# Patient Record
Sex: Female | Born: 1945
Health system: Southern US, Community
[De-identification: ages and names within clinical notes are randomized; demographics above are authoritative.]

## PROBLEM LIST (undated history)

## (undated) DIAGNOSIS — D649 Anemia, unspecified: Secondary | ICD-10-CM

## (undated) DIAGNOSIS — I82452 Acute embolism and thrombosis of left peroneal vein: Secondary | ICD-10-CM

## (undated) DIAGNOSIS — I509 Heart failure, unspecified: Secondary | ICD-10-CM

## (undated) DIAGNOSIS — I10 Essential (primary) hypertension: Secondary | ICD-10-CM

## (undated) DIAGNOSIS — I272 Pulmonary hypertension, unspecified: Secondary | ICD-10-CM

## (undated) DIAGNOSIS — R7303 Prediabetes: Secondary | ICD-10-CM

## (undated) DIAGNOSIS — E785 Hyperlipidemia, unspecified: Secondary | ICD-10-CM

## (undated) DIAGNOSIS — J1282 Pneumonia due to coronavirus disease 2019: Secondary | ICD-10-CM

## (undated) DIAGNOSIS — K31819 Angiodysplasia of stomach and duodenum without bleeding: Secondary | ICD-10-CM

## (undated) DIAGNOSIS — M1990 Unspecified osteoarthritis, unspecified site: Secondary | ICD-10-CM

## (undated) DIAGNOSIS — J849 Interstitial pulmonary disease, unspecified: Secondary | ICD-10-CM

## (undated) DIAGNOSIS — U071 COVID-19: Secondary | ICD-10-CM

## (undated) DIAGNOSIS — K635 Polyp of colon: Secondary | ICD-10-CM

## (undated) HISTORY — DX: Hyperlipidemia, unspecified: E78.5

## (undated) HISTORY — DX: Anemia, unspecified: D64.9

## (undated) HISTORY — DX: Unspecified osteoarthritis, unspecified site: M19.90

## (undated) HISTORY — DX: Polyp of colon: K63.5

## (undated) HISTORY — DX: COVID-19: U07.1

## (undated) HISTORY — DX: Essential (primary) hypertension: I10

## (undated) HISTORY — DX: Angiodysplasia of stomach and duodenum without bleeding: K31.819

## (undated) HISTORY — DX: Pneumonia due to coronavirus disease 2019: J12.82

## (undated) HISTORY — DX: Acute embolism and thrombosis of left peroneal vein: I82.452

## (undated) HISTORY — PX: CHOLECYSTECTOMY: SHX55

---

## 2003-03-04 ENCOUNTER — Ambulatory Visit (HOSPITAL_COMMUNITY): Admission: RE | Admit: 2003-03-04 | Discharge: 2003-03-04 | Payer: Self-pay | Admitting: Internal Medicine

## 2004-04-19 ENCOUNTER — Ambulatory Visit: Payer: Self-pay | Admitting: Internal Medicine

## 2004-05-04 ENCOUNTER — Ambulatory Visit: Payer: Self-pay | Admitting: Internal Medicine

## 2004-05-04 ENCOUNTER — Ambulatory Visit (HOSPITAL_COMMUNITY): Admission: RE | Admit: 2004-05-04 | Discharge: 2004-05-04 | Payer: Self-pay | Admitting: Internal Medicine

## 2005-09-21 ENCOUNTER — Ambulatory Visit: Payer: Self-pay | Admitting: Cardiology

## 2009-11-17 ENCOUNTER — Ambulatory Visit: Payer: Self-pay | Admitting: Internal Medicine

## 2009-11-17 ENCOUNTER — Ambulatory Visit (HOSPITAL_COMMUNITY): Admission: RE | Admit: 2009-11-17 | Discharge: 2009-11-17 | Payer: Self-pay | Admitting: Internal Medicine

## 2010-03-28 ENCOUNTER — Ambulatory Visit (INDEPENDENT_AMBULATORY_CARE_PROVIDER_SITE_OTHER): Payer: BC Managed Care – PPO | Admitting: Internal Medicine

## 2010-03-28 DIAGNOSIS — R933 Abnormal findings on diagnostic imaging of other parts of digestive tract: Secondary | ICD-10-CM

## 2010-04-02 NOTE — H&P (Addendum)
Jacqueline, Harris                ACCOUNT NO.:  [id number]  MEDICAL RECORD NO.:  [id number]          PATIENT TYPE:  AMB  LOCATION:  DAY                           FACILITY:  [facility]  PHYSICIAN:  Hildred Laser, M.D.    DATE OF BIRTH:  [birth year]  DATE OF ADMISSION: DATE OF DISCHARGE:  LH                             HISTORY & PHYSICAL   PRESENTING COMPLAINT:  Thickened lower end of esophagus on CT.  HISTORY OF PRESENT ILLNESS:  Jacqueline Harris is a 65 year old Caucasian female who is referred through courtesy of Dr. Woody Seller for consideration of EGD. She recently experienced right posterolateral chest pain and went to emergency room which was on February 14, 2010.  She was noted to have mildly elevated D-dimer.  Therefore, she had chest CT.  It was negative for pulmonary embolism or other problems and it revealed mild thickening to distal esophagus raising possibility of esophagitis, etc.  Dr. Woody Seller, therefore, felt that she needs to have an EGD to make sure she does nothave any significant problems.  Bentleigh denies frequent heartburn.  She says she may have heartburn no more than once a week with certain foods. She has not experienced any more episodes of chest pain.  She has occasional dysphagia only if she does not chew her food good and food bolus always passes down after few seconds.  She denies sore throat, chronic cough or hoarseness.  She has a good appetite and her weight has been stable.  She did have EGD/ED back in April 2006, when she presented with 3-year history of intermittent solid food dysphagia and an abnormal barium study suggesting narrowing in the cervical esophagus.  She had noncritical Schatzki ring and a small sliding hiatal hernia and responded to esophageal dilation.  It was felt that she may have esophageal web.  She denies abdominal pain, melena or rectal bleeding. She is up-to-date on her colonoscopies as reviewed under past medical history.  CURRENT MEDICATIONS:   Vitamin D3 10,000 units b.i.d. for 5 days and thereafter daily.  PAST MEDICAL HISTORY:  She has multiple episodes of shingles, but without any sequelae.  She had cholecystectomy within the last couple of years.  Vitamin D deficiency was diagnosed recently.  She had a colonoscopy over 6 years ago and most recently in November last year when she had 2 small adenomas removed.  Her next exam would be due in November 2016.  ALLERGIES:  NK.  FAMILY HISTORY:  Brother has been treated for colon carcinoma diagnosed at age 63 and required surgery and chemoradiation and is doing well. She may have had a second-degree relative with colon carcinoma, but not certain.  Mother died of lung CA at age 80 and father of bladder carcinoma in his 47s.  SOCIAL HISTORY:  She is single.  She worked in [employer].  She used to drink alcohol socially, but quit over 20 years ago.  She smoked cigarettes on a pack a day for 13 years, but quit 23 years ago.  She has 2 children in good health.  OBJECTIVE:  VITAL SIGNS:  Weight 175 pounds, she is 64 inches tall,  pulse 72 per minute, blood pressure 128/74 and temp is 97.5. EYES:  Conjunctivae are pink.  Sclerae are nonicteric.Marland Kitchen MOUTH:  Oropharyngeal mucosa is normal.  Dentition in satisfactory condition. NECK:  No neck masses or thyromegaly noted. CARDIAC:  With regular rhythm.  Normal S1 and S2.  No murmur or gallop noted. LUNGS:  Clear to auscultation. ABDOMEN:  Symmetrical, soft and nontender without organomegaly or masses. EXTREMITIES:  No peripheral edema or clubbing noted.  Chest CT results as above.  ASSESSMENT:  Jacqueline Harris is a 65 year old Caucasian female who had chest CT angio for chest pain and found to have thickening to distal esophagus. She had her esophagus dilated about 6 years ago, but presently not having any symptoms.  It remains to be seen if she has a silent reflux or has esophagitis.  I am afraid the only way we can be sure as there  is nothing significant [place] to examine this area directly.  RECOMMENDATIONS:  Esophagogastroduodenoscopy in near future.  The patient is also advised to take her calcium daily.  We appreciate the opportunity to participate in the care of this nice lady.     Hildred Laser, M.D.     NR/MEDQ  D:  03/28/2010  T:  03/29/2010  Job:  [id number]  cc:   Dr. Woody Seller  Electronically Signed by Hildred Laser M.D. on 04/02/2010 02:13:13 PM

## 2010-04-04 ENCOUNTER — Ambulatory Visit (HOSPITAL_COMMUNITY)
Admission: RE | Admit: 2010-04-04 | Payer: BC Managed Care – PPO | Source: Ambulatory Visit | Admitting: Internal Medicine

## 2010-04-04 ENCOUNTER — Encounter (INDEPENDENT_AMBULATORY_CARE_PROVIDER_SITE_OTHER): Payer: BC Managed Care – PPO | Admitting: Internal Medicine

## 2010-05-19 ENCOUNTER — Ambulatory Visit (HOSPITAL_COMMUNITY)
Admission: RE | Admit: 2010-05-19 | Discharge: 2010-05-19 | Disposition: A | Discharge status: Home / Self Care | Payer: BC Managed Care – PPO | Source: Ambulatory Visit | Attending: Internal Medicine | Admitting: Internal Medicine

## 2010-05-19 ENCOUNTER — Other Ambulatory Visit (INDEPENDENT_AMBULATORY_CARE_PROVIDER_SITE_OTHER): Payer: Self-pay | Admitting: Internal Medicine

## 2010-05-19 ENCOUNTER — Encounter (HOSPITAL_BASED_OUTPATIENT_CLINIC_OR_DEPARTMENT_OTHER): Payer: BC Managed Care – PPO | Admitting: Internal Medicine

## 2010-05-19 DIAGNOSIS — K449 Diaphragmatic hernia without obstruction or gangrene: Secondary | ICD-10-CM

## 2010-05-19 DIAGNOSIS — K228 Other specified diseases of esophagus: Secondary | ICD-10-CM | POA: Insufficient documentation

## 2010-05-19 DIAGNOSIS — K2289 Other specified disease of esophagus: Secondary | ICD-10-CM | POA: Insufficient documentation

## 2010-05-19 DIAGNOSIS — R933 Abnormal findings on diagnostic imaging of other parts of digestive tract: Secondary | ICD-10-CM | POA: Insufficient documentation

## 2010-05-19 DIAGNOSIS — D130 Benign neoplasm of esophagus: Secondary | ICD-10-CM

## 2010-05-19 DIAGNOSIS — K31819 Angiodysplasia of stomach and duodenum without bleeding: Secondary | ICD-10-CM

## 2010-05-19 DIAGNOSIS — K2100 Gastro-esophageal reflux disease with esophagitis, without bleeding: Secondary | ICD-10-CM | POA: Insufficient documentation

## 2010-06-03 NOTE — Op Note (Signed)
Jacqueline Harris, Jacqueline Harris                          ACCOUNT NO.:  [id number]   MEDICAL RECORD NO.:  [id number]                   PATIENT TYPE:  AMB   LOCATION:  DAY                                  FACILITY:  [facility]   PHYSICIAN:  Hildred Laser, M.D.                 DATE OF BIRTH:  07-17-1945   DATE OF PROCEDURE:  03/04/2003  DATE OF DISCHARGE:                                 OPERATIVE REPORT   PROCEDURE:  Total colonoscopy.   ENDOSCOPIST:  Hildred Laser, M.D.   INDICATIONS:  Jacqueline Harris is a 65 year old Caucasian female who is undergoing  screening colonoscopy.  Family history is negative for CRC.   The procedure and risks were reviewed with the patient and informed consent  was obtained.   PREOPERATIVE MEDICATIONS:  Demerol 50 mg IV and Versed 6 mg IV in divided  dose.   FINDINGS:  Procedure performed in endoscopy suite.  The patient's vital  signs and O2 saturation were monitored during the procedure and remained  stable.  The patient was placed in the left lateral recumbent position and  rectal examination performed.  No abnormality noted on external or digital  exam.   Olympus videoscope was placed in the rectum and advanced under vision into  the sigmoid colon and beyond.  Preparation was satisfactory.  Scope was  passed to the cecum which was identified by appendiceal orifice and the  ileocecal valve.  Pictures were taken for the record.  As the scope was  withdrawn colonic mucosa was once carefully examined.  It was normal  throughout.  The rectal mucosa similarly was normal.   The scope was retroflexed to examine the anorectal junction.  There was a  single small anal papilla, otherwise normal exam.  The endoscope was  straightened and withdrawn.  The patient tolerated the procedure well.   FINAL DIAGNOSES:  Normal colonoscopy.   RECOMMENDATIONS:  1. He should continue yearly Hemoccults.  2. Next screening exam in 10 years from now.      ___________________________________________                                            Hildred Laser, M.D.   NR/MEDQ  D:  03/04/2003  T:  03/05/2003  Job:  [id number]   cc:   Jacqueline Harris  [address]  [place]  [place] [address]  Fax: [phone]

## 2010-06-03 NOTE — Op Note (Signed)
Jacqueline Harris, Jacqueline Harris                ACCOUNT NO.:  [id number]   MEDICAL RECORD NO.:  [id number]          PATIENT TYPE:  AMB   LOCATION:  DAY                           FACILITY:  [facility]   PHYSICIAN:  Hildred Laser, M.D.    DATE OF BIRTH:  Jun 24, 1945   DATE OF PROCEDURE:  05/04/2004  DATE OF DISCHARGE:                                 OPERATIVE REPORT   PROCEDURE:  Esophagogastroduodenoscopy with esophageal dilation.   INDICATIONS:  Antrice is a 65 year old Caucasian female with three-year  history of intermittent solid food dysphagia. She recently had barium study  which suggested narrowing to cervical esophagus and posterior impression  felt to be due to cricopharyngeus muscle. She also had small sliding hiatal  hernia with noncritical Schatzki's ring. Barium pill passed through  esophagus without any delay. She is undergoing diagnostic and therapeutic  procedure. Procedure risks were reviewed with the patient and informed  consent was obtained.   PREMEDICATION:  Cetacaine spray for pharyngeal topical anesthesia, Demerol  50 mg IV, Versed 10 mg IV in divided dose.   FINDINGS:  Procedure performed in endoscopy suite. The patient's vital signs  and O2 saturation were monitored during procedure and remained stable. The  patient was placed in left lateral position and Olympus videoscope was  passed oropharynx without any difficulty into esophagus.   Esophagus. Mucosa at the esophagus normal. Noncritical incomplete ring was  noted at GE junction which was at 37 cm and hiatus was 39. Proximal segment  of the esophagus was also examined and no web or stricture was noted.   Stomach. It was empty and distended very well insufflation. Folds of  proximal stomach were normal. Examination mucosa at body, antrum, pyloric  channel as well as angularis, fundus and cardia was normal.   Duodenum. Bulbar mucosa was normal. Scope was passed to the second part of  duodenum where mucosa and folds were  normal. Endoscope was withdrawn.   Esophagus was dilated initially by passing 56-French [facility] dilator;  however, I was not able to pass this dilator completely because of  resistance. Dilator was withdrawn. Endoscope was passed again, and there was  two small linear tears at cervical esophagus. Endoscope was withdrawn, and  54-French [facility] dilator was passed through esophagus to full insertion. As  the dilator was withdrawn, endoscope was passed again, and these linear  tears were noted to extended slightly. Pictures taken for the record.  Endoscope was withdrawn. The patient tolerated the procedure well.   FINAL DIAGNOSIS:  Incomplete noncritical Schatzki's ring and a small sliding  hiatal hernia. No obvious abnormality noted to cervical esophagus; however,  esophageal dilation with a 54- and 56-French dilator resulted in tear  possibly indicative of a web.   RECOMMENDATIONS:  She will resume her usual medications. The patient advised  to call the office in one week for a progress report.      NR/MEDQ  D:  05/04/2004  T:  05/04/2004  Job:  [id number]   cc:   Jerene Bears  [address]  [place]  [place] [address]  Fax: [phone]

## 2010-06-03 NOTE — Consult Note (Signed)
Harris, Jacqueline                ACCOUNT NO.:  [id number]   MEDICAL RECORD NO.:  [id number]         PATIENT TYPE:  AMB   LOCATION:                                 FACILITY:   PHYSICIAN:  Hildred Laser, M.D.    DATE OF BIRTH:  04-28-1945   DATE OF CONSULTATION:  DATE OF DISCHARGE:                                   CONSULTATION   REFERRING PHYSICIAN:  Jerene Bears, M.D.   REASON FOR CONSULTATION:  Dysphagia.   HISTORY OF PRESENT ILLNESS:  Jacqueline Harris is a 65 year old Caucasian female,  patient of Dr. Woody Seller.  She reports over the last two to three years she has  noticed intermittent solid food dysphagia which is very sporadic.  She also  notes new onset of heartburn and acid reflux as well as some regurgitation  of food, especially in the morning after sleeping all evening.  She notes  after eating, she states food gets trapped retrosternally.  She denies any  abdominal pain.  Her bowel movements are usually every two to three days.  Occasionally, she will have hard stools.  Denies any diarrhea.  She does  have a history of iron-deficiency anemia and is currently on iron  supplementation.  She has some occasional dark stool, but denies any melena.  Appetite is good, denies any early satiety.   She had a barium esophogram.  She had mild to moderate transverse narrowing  of cervical esophagus.  There is also posterior impression on the cervical  esophagus due to the cricopharyngeus muscle.  She has a small sliding hiatal  hernia and mild narrowing of the GE junction due to a Schatski's ring.   PAST MEDICAL HISTORY:  1.  Hypercholesterolemia.  2.  Anemia, currently on iron.  3.  Post-herpetic neuralgia.  4.  Colonoscopy March 05, 2003, by Dr. Laural Golden was normal.  5.  Seasonal allergies.   PAST SURGICAL HISTORY:  Denies.   CURRENT MEDICATIONS:  1.  Slow Iron one tablet daily.  2.  Aleve p.r.n.   ALLERGIES:  No known drug allergies.   FAMILY HISTORY:  No known family history of  colon carcinoma, liver, or  chronic GI problems.  Mother deceased at age 101, secondary to lung carcinoma  and she was a smoker.  Father deceased at age of 65, secondary to bladder  cancer.  She has five siblings, one with history significant for COPD and  diabetes mellitus.   SOCIAL HISTORY:  [name] is currently separated and lives alone.  She is  working two jobs currently.  She has two grown healthy children.  She  reports a 13 pack year history of tobacco use, quitting about 20 years ago.  She denies any alcohol or drug use.   REVIEW OF SYSTEMS:  CONSTITUTIONAL:  Weight is steadily increasing.  Denies  any fever or chills.  CARDIOVASCULAR:  Denies any chest pain, palpitations.  PULMONARY:  Denies any shortness of breath, dyspnea, cough, or hemoptysis.  GASTROINTESTINAL:  See HPI.  GYNECOLOGIC:  She is post-menopausal since age  12.   PHYSICAL EXAMINATION:  VITAL SIGNS:  Weight 171 pounds, height 64 inches,  temperature 98, blood pressure 126/70, pulse 68.  GENERAL:  Jacqueline Harris is a 65 year old Caucasian female who is alert, oriented,  pleasant, cooperative, in no acute distress.  HEENT:  Sclerae clear, nonicteric.  Oropharynx pink and moist without any  lesions.  NECK:  Supple without any mass or thyromegaly.  CHEST:  Regular rate and rhythm with normal S1 and S2, without any murmurs,  rubs, clicks, or gallops.  LUNGS:  Clear to auscultation bilaterally.  ABDOMEN:  Positive bowel sounds x4, no bruits auscultated.  Soft, nontender,  nondistended, without any mass or hepatosplenomegaly.  No rebound tenderness  or guarding.  EXTREMITIES:  2+ pedal pulses bilaterally, no edema.  SKIN:  Pink, warm, and dry without any rash or jaundice.   LABORATORY DATA:  From Dr. Woody Seller office from March 02, 2004:  Hemoglobin  11.2, hematocrit 33, MCV 73.9, platelets 309.  Hemoglobin A1C 5.9.  Glucose  107, BUN 10, creatinine 0.7, calcium 9.4, total protein 6.2, albumin 3.3,  alkaline phosphatase  68, SGOT 22, total bilirubin 0.9, SGPT 23.  Sodium 140,  potassium 4.3, chloride 107, CO2 of 28, and TSH 1.44.   IMPRESSION:  Jacqueline Harris is a 66 year old Caucasian female with a two  to three year history of intermittent solid food dysphagia.  Barium  esophogram shows Schatski's ring and small sliding hiatal hernia which is  most likely the source of her dysphagia.  She is going to need  esophagogastroduodenoscopy for further evaluation and esophageal dilatation  for her dysphagia.  She currently is not on proton pump inhibitor therapy,  but has been experiencing some frequent heartburn and acid reflux and  regurgitation recently, and may need to begin this.   RECOMMENDATIONS:  1.  We will schedule an esophagogastroduodenoscopy with esophageal      dilatation in the near future with Dr. Laural Golden.  I have discussed this      procedure, including risks, benefits, to include, but not limited to      bleeding, perforation or drug reaction.  She agrees with the plan and      consent will be obtained.  2.  We will consider initiating proton pump inhibitor after      esophagogastroduodenoscopy.  3.  Further recommendations pending procedure.   I would like to thank Dr. Woody Seller for allowing [place] to participate in the care of  [name].      KC/MEDQ  D:  04/19/2004  T:  04/19/2004  Job:  [id number]   cc:   Jerene Bears  [address]  [place]  [place] [address]  Fax: [phone]

## 2010-06-07 NOTE — Op Note (Signed)
NAMEGAYNELLE, Jacqueline Harris                ACCOUNT NO.:  [id number]  MEDICAL RECORD NO.:  [id number]           PATIENT TYPE:  O  LOCATION:  DAYP                          FACILITY:  [facility]  PHYSICIAN:  Hildred Laser, M.D.    DATE OF BIRTH:  Apr 21, 1945  DATE OF PROCEDURE:  05/19/2010 DATE OF DISCHARGE:                              OPERATIVE REPORT   PROCEDURE:  Esophagogastroduodenoscopy.  INDICATION:  [name] is a 65 year old Caucasian female who had right- sided chest pain and found to have elevated D-dimer and therefore had a chest CT back in January.  CT was negative for pulmonary emboli, but showed thickening to distal esophagus.  She has a history of GERD, but presently with sporadic heartburn.  She does not take any medications. She also denies dysphagia.  She is undergoing diagnostic EGD.  She had EGD over 6 years ago.  Procedure risks were reviewed with the patient. Informed consent was obtained.  MEDICATIONS FOR CONSCIOUS SEDATION:  Cetacaine spray for pharyngeal topical anesthesia, Demerol 50 mg IV, and Versed 5 mg IV.  FINDINGS:  Procedure performed in endoscopy suite.  The patient's vital signs and O2 saturations were monitored during the procedure and remained stable.  The patient was placed in left lateral recumbent position and Pentax videoscope was passed via oropharynx without any difficulty into esophagus.  Esophagus:  Mucosa of the proximal and middle third was normal. Distally, there was a 4-mm flat polyp which was proximal to GE junction which was ablated via cold biopsy on the way out.  There was focal edema and erythema to the GE junction, which was located at 34 cm.  No mass, ulcer, or other abnormalities were noted.  Hiatus was at 36.  Stomach:  It was empty and distended very well with insufflation.  Folds of proximal stomach were normal.  Examination of mucosa and body was normal.  In the antrum, she had 4 linear streaks of mucosa with telangiectasia.  These  extended almost down to the pylorus.  None had stigmata of bleed and they were left alone.  No erosions or changes of portal gastropathy were noted.  Angularis, fundus, and cardia were unremarkable.  Duodenum:  Bulbar mucosa was normal.  Scope was passed into second part of the duodenum where mucosa and folds were normal.  On the way out, the small distal esophageal polyp was ablated via cold biopsy and endoscope was withdrawn.  The patient tolerated the procedure well.  FINAL DIAGNOSES: 1. No evidence of esophageal neoplasm. 2. A 4-mm polyp ablated via cold biopsy from distal esophagus,     possibly a squamous papilloma. 3. Mild changes of reflux esophagitis, limited to gastroesophageal     junction. 4. A 2-cm sliding hiatal hernia. 5. Gastral antral vascular ectasia (GAVE) without stigmata of     bleeding.  RECOMMENDATIONS:  Antireflux measures.  Prescription written for omeprazole 20 mg, 30 with 5 refills.  I asked the patient to take it daily for 2 months or so and thereafter she could use it on demand.  I will be contacting the patient with results of biopsy and further recommendations if  any.          ______________________________ Hildred Laser, M.D.     NR/MEDQ  D:  05/19/2010  T:  05/20/2010  Job:  [id number]  cc:   Dr. Woody Seller  Electronically Signed by Hildred Laser M.D. on 06/07/2010 12:23:16 PM

## 2013-06-03 ENCOUNTER — Telehealth: Payer: Self-pay

## 2013-06-03 NOTE — Telephone Encounter (Signed)
Noted.

## 2013-06-03 NOTE — Telephone Encounter (Signed)
Pt received a letter for TCS be she is going to stay with NUR. I gave her the number to call his office.

## 2014-02-09 ENCOUNTER — Telehealth (INDEPENDENT_AMBULATORY_CARE_PROVIDER_SITE_OTHER): Payer: Self-pay | Admitting: *Deleted

## 2014-02-09 NOTE — Telephone Encounter (Signed)
Calling to schedule TCS

## 2014-02-11 NOTE — Telephone Encounter (Signed)
Spoke to Ms Vadala, she isn't due until Nov 2016

## 2014-09-30 ENCOUNTER — Other Ambulatory Visit (INDEPENDENT_AMBULATORY_CARE_PROVIDER_SITE_OTHER): Payer: Self-pay | Admitting: *Deleted

## 2014-09-30 DIAGNOSIS — Z86010 Personal history of colonic polyps: Secondary | ICD-10-CM

## 2014-09-30 DIAGNOSIS — Z800 Family history of malignant neoplasm of digestive organs: Secondary | ICD-10-CM

## 2014-10-27 ENCOUNTER — Telehealth (INDEPENDENT_AMBULATORY_CARE_PROVIDER_SITE_OTHER): Payer: Self-pay | Admitting: *Deleted

## 2014-10-27 DIAGNOSIS — Z1211 Encounter for screening for malignant neoplasm of colon: Secondary | ICD-10-CM

## 2014-10-27 MED ORDER — SUPREP BOWEL PREP KIT 17.5-3.13-1.6 GM/177ML PO SOLN: 1.0000 | Freq: Once | ORAL | Status: DC

## 2014-10-27 NOTE — Telephone Encounter (Signed)
Patient needs suprep

## 2014-11-10 ENCOUNTER — Telehealth (INDEPENDENT_AMBULATORY_CARE_PROVIDER_SITE_OTHER): Payer: Self-pay | Admitting: *Deleted

## 2014-11-10 NOTE — Telephone Encounter (Signed)
Referring MD/PCP: vyas   Procedure: tcs  Reason/Indication:  Hx polyps, fam hx colon ca  Has patient had this procedure before?  Yes, 2011 -- epic  If so, when, by whom and where?    Is there a family history of colon cancer?  Yes, brother  Who?  What age when diagnosed?    Is patient diabetic?   no      Does patient have prosthetic heart valve?  no  Do you have a pacemaker?  no  Has patient ever had endocarditis? no  Has patient had joint replacement within last 12 months?  no  Does patient tend to be constipated or take laxatives? no  Does patient have a history of alcohol/drug use? no  Is patient on Coumadin, Plavix and/or Aspirin? no  Medications: none  Allergies: nkda  Medication Adjustment:   Procedure date & time: 12/03/14 at 830

## 2014-11-10 NOTE — Telephone Encounter (Signed)
agree

## 2014-12-03 ENCOUNTER — Encounter (HOSPITAL_COMMUNITY): Payer: Self-pay | Admitting: *Deleted

## 2014-12-03 ENCOUNTER — Ambulatory Visit (HOSPITAL_COMMUNITY)
Admission: RE | Admit: 2014-12-03 | Discharge: 2014-12-03 | Disposition: A | Discharge status: Home / Self Care | Payer: Medicare HMO | Source: Ambulatory Visit | Attending: Internal Medicine | Admitting: Internal Medicine

## 2014-12-03 ENCOUNTER — Other Ambulatory Visit (INDEPENDENT_AMBULATORY_CARE_PROVIDER_SITE_OTHER): Payer: Self-pay | Admitting: *Deleted

## 2014-12-03 ENCOUNTER — Encounter (INDEPENDENT_AMBULATORY_CARE_PROVIDER_SITE_OTHER): Payer: Self-pay

## 2014-12-03 ENCOUNTER — Encounter (HOSPITAL_COMMUNITY)
Admission: RE | Disposition: A | Discharge status: Home / Self Care | Payer: Self-pay | Source: Ambulatory Visit | Attending: Internal Medicine

## 2014-12-03 DIAGNOSIS — Z800 Family history of malignant neoplasm of digestive organs: Secondary | ICD-10-CM

## 2014-12-03 DIAGNOSIS — Z86010 Personal history of colonic polyps: Secondary | ICD-10-CM | POA: Insufficient documentation

## 2014-12-03 DIAGNOSIS — Z801 Family history of malignant neoplasm of trachea, bronchus and lung: Secondary | ICD-10-CM | POA: Diagnosis not present

## 2014-12-03 DIAGNOSIS — R161 Splenomegaly, not elsewhere classified: Secondary | ICD-10-CM

## 2014-12-03 DIAGNOSIS — Z1211 Encounter for screening for malignant neoplasm of colon: Secondary | ICD-10-CM | POA: Insufficient documentation

## 2014-12-03 DIAGNOSIS — K644 Residual hemorrhoidal skin tags: Secondary | ICD-10-CM | POA: Diagnosis not present

## 2014-12-03 DIAGNOSIS — Z87891 Personal history of nicotine dependence: Secondary | ICD-10-CM | POA: Diagnosis not present

## 2014-12-03 DIAGNOSIS — K648 Other hemorrhoids: Secondary | ICD-10-CM

## 2014-12-03 DIAGNOSIS — K6389 Other specified diseases of intestine: Secondary | ICD-10-CM | POA: Diagnosis not present

## 2014-12-03 DIAGNOSIS — Q2733 Arteriovenous malformation of digestive system vessel: Secondary | ICD-10-CM | POA: Diagnosis not present

## 2014-12-03 HISTORY — PX: COLONOSCOPY: SHX5424

## 2014-12-03 SURGERY — COLONOSCOPY
Anesthesia: Moderate Sedation

## 2014-12-03 MED ORDER — SODIUM CHLORIDE 0.9 % IV SOLN
INTRAVENOUS | Status: DC
  Administered 2014-12-03: 10:00:00 via INTRAVENOUS

## 2014-12-03 MED ORDER — MIDAZOLAM HCL 5 MG/5ML IJ SOLN
INTRAMUSCULAR | Status: DC | PRN
  Administered 2014-12-03 (×3): 2 mg via INTRAVENOUS

## 2014-12-03 MED ORDER — MEPERIDINE HCL 50 MG/ML IJ SOLN
INTRAMUSCULAR | Status: DC | PRN
  Administered 2014-12-03 (×2): 25 mg via INTRAVENOUS

## 2014-12-03 MED ORDER — MEPERIDINE HCL 50 MG/ML IJ SOLN
INTRAMUSCULAR | Status: AC
  Filled 2014-12-03: qty 1

## 2014-12-03 MED ORDER — MIDAZOLAM HCL 5 MG/5ML IJ SOLN
INTRAMUSCULAR | Status: AC
  Filled 2014-12-03: qty 10

## 2014-12-03 NOTE — H&P (Signed)
Jacqueline Harris is an 69 y.o. female.   Chief Complaint:  Patient is here for colonoscopy. HPI:  69 year old Caucasian female was history of colonic adenomas family history of CRC in brother. She is here for surveillance colonoscopy. Last exam was in November 2011 with removal of two small tubular adenomas. She denies abdominal pain change in bowel habits or rectal bleeding.  family history significant for CRC and brother who is 64, diagnosis and 2 years later of metastatic disease.  History reviewed. No pertinent past medical history.  Past Surgical History  Procedure Laterality Date  . Cholecystectomy      Family History  Problem Relation Age of Onset  . Cancer - Lung Mother   . Cancer Father   . Cancer - Colon Brother   . Diabetes Sister   . Hypertension Sister    Social History:  reports that she has quit smoking. She does not have any smokeless tobacco history on file. She reports that she does not drink alcohol or use illicit drugs.  Allergies: No Known Allergies  Medications Prior to Admission  Medication Sig Dispense Refill  . SUPREP BOWEL PREP SOLN Take 1 kit by mouth once. 1 Bottle 0    No results found for this or any previous visit (from the past 48 hour(s)). No results found.  ROS  Blood pressure 115/79, pulse 85, temperature 98.2 F (36.8 C), temperature source Oral, resp. rate 18, height 5' 4" (1.626 m), weight 155 lb (70.308 kg), SpO2 100 %. Physical Exam  Constitutional: She appears well-developed and well-nourished.  HENT:  Mouth/Throat: Oropharynx is clear and moist.  Eyes: Conjunctivae are normal. No scleral icterus.  Neck: No thyromegaly present.  Cardiovascular: Normal rate, regular rhythm and normal heart sounds.   No murmur heard. Respiratory: Effort normal and breath sounds normal.  GI: Soft. She exhibits no distension and no mass. There is no tenderness.  Spleen tip is palpable.  Musculoskeletal: She exhibits no edema.  Lymphadenopathy:   She has no cervical adenopathy.  Neurological: She is alert.  Skin: Skin is warm and dry.     Assessment/Plan History of colonic adenomas and family history of CRC. Surveillance colonoscopy.  REHMAN,NAJEEB U 12/03/2014, 11:06 AM

## 2014-12-03 NOTE — Discharge Instructions (Signed)
Resume usual diet. No driving for 24 hours. Next colonoscopy in 5 years. Ultrasound to be scheduled.  Colonoscopy, Care After These instructions give you information on caring for yourself after your procedure. Your doctor may also give you more specific instructions. Call your doctor if you have any problems or questions after your procedure. HOME CARE  Do not drive for 24 hours.  Do not sign important papers or use machinery for 24 hours.  You may shower.  You may go back to your usual activities, but go slower for the first 24 hours.  Take rest breaks often during the first 24 hours.  Walk around or use warm packs on your belly (abdomen) if you have belly cramping or gas.  Drink enough fluids to keep your pee (urine) clear or pale yellow.  Resume your normal diet. Avoid heavy or fried foods.  Avoid drinking alcohol for 24 hours or as told by your doctor.  Only take medicines as told by your doctor. If a tissue sample (biopsy) was taken during the procedure:   Do not take aspirin or blood thinners for 7 days, or as told by your doctor.  Do not drink alcohol for 7 days, or as told by your doctor.  Eat soft foods for the first 24 hours. GET HELP IF: You still have a small amount of blood in your poop (stool) 2-3 days after the procedure. GET HELP RIGHT AWAY IF:  You have more than a small amount of blood in your poop.  You see clumps of tissue (blood clots) in your poop.  Your belly is puffy (swollen).  You feel sick to your stomach (nauseous) or throw up (vomit).  You have a fever.  You have belly pain that gets worse and medicine does not help. MAKE SURE YOU:  Understand these instructions.  Will watch your condition.  Will get help right away if you are not doing well or get worse.   This information is not intended to replace advice given to you by your health care provider. Make sure you discuss any questions you have with your health care provider.     Document Released: 02/04/2010 Document Revised: 01/07/2013 Document Reviewed: 09/09/2012 Elsevier Interactive Patient Education [employer].

## 2014-12-03 NOTE — Op Note (Signed)
COLONOSCOPY PROCEDURE REPORT  PATIENT:  Jacqueline Harris  MR#:  [id number] Birthdate:  01-05-1946, 69 y.o., female Endoscopist:  Dr. Rogene Houston, MD Referred By:  Dr. Glenda Chroman, MD Procedure Date: 12/03/2014  Procedure:   Colonoscopy  Indications:  Patient is 69 year old Caucasian female with history of colonic adenomas and family history of CRC in brother who was diagnosed at age 80 and oto-metastatic disease at age 22. Last colonoscopy was 5 years ago with removal of 2 small tubular adenomas.  Informed Consent:  The procedure and risks were reviewed with the patient and informed consent was obtained.  Medications:  Demerol 50 mg IV Versed 6 mg IV  Description of procedure:  After a digital rectal exam was performed, that colonoscope was advanced from the anus through the rectum and colon to the area of the cecum, ileocecal valve and appendiceal orifice. The cecum was deeply intubated. These structures were well-seen and photographed for the record. From the level of the cecum and ileocecal valve, the scope was slowly and cautiously withdrawn. The mucosal surfaces were carefully surveyed utilizing scope tip to flexion to facilitate fold flattening as needed. The scope was pulled down into the rectum where a thorough exam including retroflexion was performed.  Findings:  Prep excellent. Normal mucosa of cecum, ascending colon, hepatic flexure, transverse colon, splenic flexure, descending and sigmoid colon. Number rectal mucosa. Small hemorrhoids below the dentate line and anal papillae.   Therapeutic/Diagnostic Maneuvers Performed:  None  Complications:  None  EBL: None  Cecal Withdrawal Time:  7 minutes  Impression:  Normal colonoscopy except small external hemorrhoids and anal papillae.  Recommendations:  Standard instructions given. Upper abdominal ultrasound to be scheduled further evaluate palpable spleen. Next colonoscopy in 5 years.   Hyman Crossan U   12/03/2014 11:40 AM  CC: Dr. Glenda Chroman., MD & Dr. Rayne Du ref. provider found

## 2014-12-07 ENCOUNTER — Encounter (HOSPITAL_COMMUNITY): Payer: Self-pay | Admitting: Internal Medicine

## 2014-12-11 ENCOUNTER — Ambulatory Visit (HOSPITAL_COMMUNITY): Payer: Medicare HMO

## 2014-12-11 ENCOUNTER — Ambulatory Visit (HOSPITAL_COMMUNITY)
Admission: RE | Admit: 2014-12-11 | Discharge: 2014-12-11 | Disposition: A | Discharge status: Home / Self Care | Payer: Medicare HMO | Source: Ambulatory Visit | Attending: Internal Medicine | Admitting: Internal Medicine

## 2014-12-11 DIAGNOSIS — Z9049 Acquired absence of other specified parts of digestive tract: Secondary | ICD-10-CM | POA: Diagnosis not present

## 2014-12-11 DIAGNOSIS — R161 Splenomegaly, not elsewhere classified: Secondary | ICD-10-CM | POA: Diagnosis present

## 2014-12-28 ENCOUNTER — Encounter (INDEPENDENT_AMBULATORY_CARE_PROVIDER_SITE_OTHER): Payer: Self-pay

## 2016-01-31 DIAGNOSIS — Z713 Dietary counseling and surveillance: Secondary | ICD-10-CM | POA: Diagnosis not present

## 2016-01-31 DIAGNOSIS — Z789 Other specified health status: Secondary | ICD-10-CM | POA: Diagnosis not present

## 2016-01-31 DIAGNOSIS — Z6827 Body mass index (BMI) 27.0-27.9, adult: Secondary | ICD-10-CM | POA: Diagnosis not present

## 2016-01-31 DIAGNOSIS — E1165 Type 2 diabetes mellitus with hyperglycemia: Secondary | ICD-10-CM | POA: Diagnosis not present

## 2016-01-31 DIAGNOSIS — Z299 Encounter for prophylactic measures, unspecified: Secondary | ICD-10-CM | POA: Diagnosis not present

## 2016-01-31 DIAGNOSIS — M353 Polymyalgia rheumatica: Secondary | ICD-10-CM | POA: Diagnosis not present

## 2016-02-18 ENCOUNTER — Encounter: Payer: Self-pay | Admitting: Internal Medicine

## 2016-04-21 DIAGNOSIS — Z87891 Personal history of nicotine dependence: Secondary | ICD-10-CM | POA: Diagnosis not present

## 2016-04-21 DIAGNOSIS — Z6828 Body mass index (BMI) 28.0-28.9, adult: Secondary | ICD-10-CM | POA: Diagnosis not present

## 2016-04-21 DIAGNOSIS — Z0000 Encounter for general adult medical examination without abnormal findings: Secondary | ICD-10-CM | POA: Diagnosis not present

## 2016-05-08 DIAGNOSIS — M353 Polymyalgia rheumatica: Secondary | ICD-10-CM | POA: Diagnosis not present

## 2016-05-08 DIAGNOSIS — Z299 Encounter for prophylactic measures, unspecified: Secondary | ICD-10-CM | POA: Diagnosis not present

## 2016-05-08 DIAGNOSIS — Z713 Dietary counseling and surveillance: Secondary | ICD-10-CM | POA: Diagnosis not present

## 2016-05-08 DIAGNOSIS — E1165 Type 2 diabetes mellitus with hyperglycemia: Secondary | ICD-10-CM | POA: Diagnosis not present

## 2016-05-08 DIAGNOSIS — Z1231 Encounter for screening mammogram for malignant neoplasm of breast: Secondary | ICD-10-CM | POA: Diagnosis not present

## 2016-05-17 DIAGNOSIS — R69 Illness, unspecified: Secondary | ICD-10-CM | POA: Diagnosis not present

## 2016-06-07 DIAGNOSIS — R69 Illness, unspecified: Secondary | ICD-10-CM | POA: Diagnosis not present

## 2016-08-11 DIAGNOSIS — E1165 Type 2 diabetes mellitus with hyperglycemia: Secondary | ICD-10-CM | POA: Diagnosis not present

## 2016-08-11 DIAGNOSIS — M353 Polymyalgia rheumatica: Secondary | ICD-10-CM | POA: Diagnosis not present

## 2016-08-11 DIAGNOSIS — Z6827 Body mass index (BMI) 27.0-27.9, adult: Secondary | ICD-10-CM | POA: Diagnosis not present

## 2016-08-11 DIAGNOSIS — E7800 Pure hypercholesterolemia, unspecified: Secondary | ICD-10-CM | POA: Diagnosis not present

## 2016-08-11 DIAGNOSIS — Z299 Encounter for prophylactic measures, unspecified: Secondary | ICD-10-CM | POA: Diagnosis not present

## 2016-09-19 DIAGNOSIS — H524 Presbyopia: Secondary | ICD-10-CM | POA: Diagnosis not present

## 2016-11-01 DIAGNOSIS — Z79899 Other long term (current) drug therapy: Secondary | ICD-10-CM | POA: Diagnosis not present

## 2016-11-01 DIAGNOSIS — Z1339 Encounter for screening examination for other mental health and behavioral disorders: Secondary | ICD-10-CM | POA: Diagnosis not present

## 2016-11-01 DIAGNOSIS — Z6826 Body mass index (BMI) 26.0-26.9, adult: Secondary | ICD-10-CM | POA: Diagnosis not present

## 2016-11-01 DIAGNOSIS — Z299 Encounter for prophylactic measures, unspecified: Secondary | ICD-10-CM | POA: Diagnosis not present

## 2016-11-01 DIAGNOSIS — R5383 Other fatigue: Secondary | ICD-10-CM | POA: Diagnosis not present

## 2016-11-01 DIAGNOSIS — Z1211 Encounter for screening for malignant neoplasm of colon: Secondary | ICD-10-CM | POA: Diagnosis not present

## 2016-11-01 DIAGNOSIS — Z7189 Other specified counseling: Secondary | ICD-10-CM | POA: Diagnosis not present

## 2016-11-01 DIAGNOSIS — Z0000 Encounter for general adult medical examination without abnormal findings: Secondary | ICD-10-CM | POA: Diagnosis not present

## 2016-11-01 DIAGNOSIS — E7800 Pure hypercholesterolemia, unspecified: Secondary | ICD-10-CM | POA: Diagnosis not present

## 2016-11-01 DIAGNOSIS — Z1331 Encounter for screening for depression: Secondary | ICD-10-CM | POA: Diagnosis not present

## 2016-11-07 DIAGNOSIS — R69 Illness, unspecified: Secondary | ICD-10-CM | POA: Diagnosis not present

## 2016-12-18 DIAGNOSIS — R69 Illness, unspecified: Secondary | ICD-10-CM | POA: Diagnosis not present

## 2016-12-22 DIAGNOSIS — Z6826 Body mass index (BMI) 26.0-26.9, adult: Secondary | ICD-10-CM | POA: Diagnosis not present

## 2016-12-22 DIAGNOSIS — Z713 Dietary counseling and surveillance: Secondary | ICD-10-CM | POA: Diagnosis not present

## 2016-12-22 DIAGNOSIS — Z299 Encounter for prophylactic measures, unspecified: Secondary | ICD-10-CM | POA: Diagnosis not present

## 2016-12-22 DIAGNOSIS — M353 Polymyalgia rheumatica: Secondary | ICD-10-CM | POA: Diagnosis not present

## 2016-12-22 DIAGNOSIS — E1165 Type 2 diabetes mellitus with hyperglycemia: Secondary | ICD-10-CM | POA: Diagnosis not present

## 2017-01-18 DIAGNOSIS — E28319 Asymptomatic premature menopause: Secondary | ICD-10-CM | POA: Diagnosis not present

## 2017-01-18 DIAGNOSIS — E2839 Other primary ovarian failure: Secondary | ICD-10-CM | POA: Diagnosis not present

## 2017-02-12 DIAGNOSIS — R69 Illness, unspecified: Secondary | ICD-10-CM | POA: Diagnosis not present

## 2017-03-14 DIAGNOSIS — Z713 Dietary counseling and surveillance: Secondary | ICD-10-CM | POA: Diagnosis not present

## 2017-03-14 DIAGNOSIS — R350 Frequency of micturition: Secondary | ICD-10-CM | POA: Diagnosis not present

## 2017-03-14 DIAGNOSIS — N390 Urinary tract infection, site not specified: Secondary | ICD-10-CM | POA: Diagnosis not present

## 2017-03-14 DIAGNOSIS — Z6826 Body mass index (BMI) 26.0-26.9, adult: Secondary | ICD-10-CM | POA: Diagnosis not present

## 2017-03-14 DIAGNOSIS — Z299 Encounter for prophylactic measures, unspecified: Secondary | ICD-10-CM | POA: Diagnosis not present

## 2017-04-19 DIAGNOSIS — Z6826 Body mass index (BMI) 26.0-26.9, adult: Secondary | ICD-10-CM | POA: Diagnosis not present

## 2017-04-19 DIAGNOSIS — Z299 Encounter for prophylactic measures, unspecified: Secondary | ICD-10-CM | POA: Diagnosis not present

## 2017-04-19 DIAGNOSIS — M545 Low back pain: Secondary | ICD-10-CM | POA: Diagnosis not present

## 2017-04-19 DIAGNOSIS — Z713 Dietary counseling and surveillance: Secondary | ICD-10-CM | POA: Diagnosis not present

## 2017-04-19 DIAGNOSIS — N390 Urinary tract infection, site not specified: Secondary | ICD-10-CM | POA: Diagnosis not present

## 2017-04-27 DIAGNOSIS — M353 Polymyalgia rheumatica: Secondary | ICD-10-CM | POA: Diagnosis not present

## 2017-04-27 DIAGNOSIS — E1165 Type 2 diabetes mellitus with hyperglycemia: Secondary | ICD-10-CM | POA: Diagnosis not present

## 2017-04-27 DIAGNOSIS — Z299 Encounter for prophylactic measures, unspecified: Secondary | ICD-10-CM | POA: Diagnosis not present

## 2017-04-27 DIAGNOSIS — Z713 Dietary counseling and surveillance: Secondary | ICD-10-CM | POA: Diagnosis not present

## 2017-04-27 DIAGNOSIS — Z6826 Body mass index (BMI) 26.0-26.9, adult: Secondary | ICD-10-CM | POA: Diagnosis not present

## 2017-05-10 DIAGNOSIS — Z1231 Encounter for screening mammogram for malignant neoplasm of breast: Secondary | ICD-10-CM | POA: Diagnosis not present

## 2017-05-21 DIAGNOSIS — N950 Postmenopausal bleeding: Secondary | ICD-10-CM | POA: Diagnosis not present

## 2017-05-31 DIAGNOSIS — N950 Postmenopausal bleeding: Secondary | ICD-10-CM | POA: Diagnosis not present

## 2017-05-31 DIAGNOSIS — I868 Varicose veins of other specified sites: Secondary | ICD-10-CM | POA: Diagnosis not present

## 2017-06-20 DIAGNOSIS — R69 Illness, unspecified: Secondary | ICD-10-CM | POA: Diagnosis not present

## 2017-07-17 DIAGNOSIS — R69 Illness, unspecified: Secondary | ICD-10-CM | POA: Diagnosis not present

## 2017-08-07 DIAGNOSIS — M353 Polymyalgia rheumatica: Secondary | ICD-10-CM | POA: Diagnosis not present

## 2017-08-07 DIAGNOSIS — Z6826 Body mass index (BMI) 26.0-26.9, adult: Secondary | ICD-10-CM | POA: Diagnosis not present

## 2017-08-07 DIAGNOSIS — R21 Rash and other nonspecific skin eruption: Secondary | ICD-10-CM | POA: Diagnosis not present

## 2017-08-07 DIAGNOSIS — Z299 Encounter for prophylactic measures, unspecified: Secondary | ICD-10-CM | POA: Diagnosis not present

## 2017-08-07 DIAGNOSIS — E1165 Type 2 diabetes mellitus with hyperglycemia: Secondary | ICD-10-CM | POA: Diagnosis not present

## 2017-10-10 DIAGNOSIS — H25013 Cortical age-related cataract, bilateral: Secondary | ICD-10-CM | POA: Diagnosis not present

## 2017-10-10 DIAGNOSIS — H524 Presbyopia: Secondary | ICD-10-CM | POA: Diagnosis not present

## 2017-10-10 DIAGNOSIS — H04123 Dry eye syndrome of bilateral lacrimal glands: Secondary | ICD-10-CM | POA: Diagnosis not present

## 2017-10-10 DIAGNOSIS — H2513 Age-related nuclear cataract, bilateral: Secondary | ICD-10-CM | POA: Diagnosis not present

## 2017-11-20 DIAGNOSIS — E7800 Pure hypercholesterolemia, unspecified: Secondary | ICD-10-CM | POA: Diagnosis not present

## 2017-11-20 DIAGNOSIS — Z299 Encounter for prophylactic measures, unspecified: Secondary | ICD-10-CM | POA: Diagnosis not present

## 2017-11-20 DIAGNOSIS — Z6826 Body mass index (BMI) 26.0-26.9, adult: Secondary | ICD-10-CM | POA: Diagnosis not present

## 2017-11-20 DIAGNOSIS — Z789 Other specified health status: Secondary | ICD-10-CM | POA: Diagnosis not present

## 2017-11-20 DIAGNOSIS — Z7189 Other specified counseling: Secondary | ICD-10-CM | POA: Diagnosis not present

## 2017-11-20 DIAGNOSIS — Z79899 Other long term (current) drug therapy: Secondary | ICD-10-CM | POA: Diagnosis not present

## 2017-11-20 DIAGNOSIS — R5383 Other fatigue: Secondary | ICD-10-CM | POA: Diagnosis not present

## 2017-11-20 DIAGNOSIS — Z1211 Encounter for screening for malignant neoplasm of colon: Secondary | ICD-10-CM | POA: Diagnosis not present

## 2017-11-20 DIAGNOSIS — Z1331 Encounter for screening for depression: Secondary | ICD-10-CM | POA: Diagnosis not present

## 2017-11-20 DIAGNOSIS — Z0000 Encounter for general adult medical examination without abnormal findings: Secondary | ICD-10-CM | POA: Diagnosis not present

## 2017-11-20 DIAGNOSIS — E1165 Type 2 diabetes mellitus with hyperglycemia: Secondary | ICD-10-CM | POA: Diagnosis not present

## 2017-11-27 DIAGNOSIS — R69 Illness, unspecified: Secondary | ICD-10-CM | POA: Diagnosis not present

## 2018-01-01 DIAGNOSIS — R69 Illness, unspecified: Secondary | ICD-10-CM | POA: Diagnosis not present

## 2018-01-03 DIAGNOSIS — R69 Illness, unspecified: Secondary | ICD-10-CM | POA: Diagnosis not present

## 2018-02-25 DIAGNOSIS — M1710 Unilateral primary osteoarthritis, unspecified knee: Secondary | ICD-10-CM | POA: Diagnosis not present

## 2018-02-25 DIAGNOSIS — E1165 Type 2 diabetes mellitus with hyperglycemia: Secondary | ICD-10-CM | POA: Diagnosis not present

## 2018-02-25 DIAGNOSIS — Z6825 Body mass index (BMI) 25.0-25.9, adult: Secondary | ICD-10-CM | POA: Diagnosis not present

## 2018-02-25 DIAGNOSIS — Z299 Encounter for prophylactic measures, unspecified: Secondary | ICD-10-CM | POA: Diagnosis not present

## 2018-02-25 DIAGNOSIS — M353 Polymyalgia rheumatica: Secondary | ICD-10-CM | POA: Diagnosis not present

## 2018-03-06 DIAGNOSIS — M869 Osteomyelitis, unspecified: Secondary | ICD-10-CM | POA: Diagnosis not present

## 2018-03-06 DIAGNOSIS — L03115 Cellulitis of right lower limb: Secondary | ICD-10-CM | POA: Diagnosis not present

## 2018-03-06 DIAGNOSIS — E11621 Type 2 diabetes mellitus with foot ulcer: Secondary | ICD-10-CM | POA: Diagnosis not present

## 2018-03-06 DIAGNOSIS — E11628 Type 2 diabetes mellitus with other skin complications: Secondary | ICD-10-CM | POA: Diagnosis not present

## 2018-03-07 DIAGNOSIS — E11621 Type 2 diabetes mellitus with foot ulcer: Secondary | ICD-10-CM | POA: Diagnosis not present

## 2018-03-07 DIAGNOSIS — L03115 Cellulitis of right lower limb: Secondary | ICD-10-CM | POA: Diagnosis not present

## 2018-03-07 DIAGNOSIS — E11628 Type 2 diabetes mellitus with other skin complications: Secondary | ICD-10-CM | POA: Diagnosis not present

## 2018-03-07 DIAGNOSIS — M869 Osteomyelitis, unspecified: Secondary | ICD-10-CM | POA: Diagnosis not present

## 2018-03-08 DIAGNOSIS — E11628 Type 2 diabetes mellitus with other skin complications: Secondary | ICD-10-CM | POA: Diagnosis not present

## 2018-03-08 DIAGNOSIS — E11621 Type 2 diabetes mellitus with foot ulcer: Secondary | ICD-10-CM | POA: Diagnosis not present

## 2018-03-08 DIAGNOSIS — M869 Osteomyelitis, unspecified: Secondary | ICD-10-CM | POA: Diagnosis not present

## 2018-03-08 DIAGNOSIS — L03115 Cellulitis of right lower limb: Secondary | ICD-10-CM | POA: Diagnosis not present

## 2018-03-09 DIAGNOSIS — E11628 Type 2 diabetes mellitus with other skin complications: Secondary | ICD-10-CM | POA: Diagnosis not present

## 2018-03-09 DIAGNOSIS — L03115 Cellulitis of right lower limb: Secondary | ICD-10-CM | POA: Diagnosis not present

## 2018-03-09 DIAGNOSIS — M869 Osteomyelitis, unspecified: Secondary | ICD-10-CM | POA: Diagnosis not present

## 2018-03-09 DIAGNOSIS — E11621 Type 2 diabetes mellitus with foot ulcer: Secondary | ICD-10-CM | POA: Diagnosis not present

## 2018-03-10 DIAGNOSIS — E11628 Type 2 diabetes mellitus with other skin complications: Secondary | ICD-10-CM | POA: Diagnosis not present

## 2018-03-10 DIAGNOSIS — E11621 Type 2 diabetes mellitus with foot ulcer: Secondary | ICD-10-CM | POA: Diagnosis not present

## 2018-03-10 DIAGNOSIS — L03115 Cellulitis of right lower limb: Secondary | ICD-10-CM | POA: Diagnosis not present

## 2018-03-10 DIAGNOSIS — M869 Osteomyelitis, unspecified: Secondary | ICD-10-CM | POA: Diagnosis not present

## 2018-03-11 DIAGNOSIS — E11621 Type 2 diabetes mellitus with foot ulcer: Secondary | ICD-10-CM | POA: Diagnosis not present

## 2018-03-11 DIAGNOSIS — L03115 Cellulitis of right lower limb: Secondary | ICD-10-CM | POA: Diagnosis not present

## 2018-03-11 DIAGNOSIS — M869 Osteomyelitis, unspecified: Secondary | ICD-10-CM | POA: Diagnosis not present

## 2018-03-11 DIAGNOSIS — E11628 Type 2 diabetes mellitus with other skin complications: Secondary | ICD-10-CM | POA: Diagnosis not present

## 2018-06-03 DIAGNOSIS — M353 Polymyalgia rheumatica: Secondary | ICD-10-CM | POA: Diagnosis not present

## 2018-06-03 DIAGNOSIS — Z6826 Body mass index (BMI) 26.0-26.9, adult: Secondary | ICD-10-CM | POA: Diagnosis not present

## 2018-06-03 DIAGNOSIS — E1165 Type 2 diabetes mellitus with hyperglycemia: Secondary | ICD-10-CM | POA: Diagnosis not present

## 2018-06-03 DIAGNOSIS — Z713 Dietary counseling and surveillance: Secondary | ICD-10-CM | POA: Diagnosis not present

## 2018-06-03 DIAGNOSIS — Z299 Encounter for prophylactic measures, unspecified: Secondary | ICD-10-CM | POA: Diagnosis not present

## 2018-06-06 DIAGNOSIS — Z1231 Encounter for screening mammogram for malignant neoplasm of breast: Secondary | ICD-10-CM | POA: Diagnosis not present

## 2018-07-15 DIAGNOSIS — R69 Illness, unspecified: Secondary | ICD-10-CM | POA: Diagnosis not present

## 2018-09-11 DIAGNOSIS — Z299 Encounter for prophylactic measures, unspecified: Secondary | ICD-10-CM | POA: Diagnosis not present

## 2018-09-11 DIAGNOSIS — E1165 Type 2 diabetes mellitus with hyperglycemia: Secondary | ICD-10-CM | POA: Diagnosis not present

## 2018-09-11 DIAGNOSIS — Z6825 Body mass index (BMI) 25.0-25.9, adult: Secondary | ICD-10-CM | POA: Diagnosis not present

## 2018-09-11 DIAGNOSIS — E7800 Pure hypercholesterolemia, unspecified: Secondary | ICD-10-CM | POA: Diagnosis not present

## 2018-09-11 DIAGNOSIS — M353 Polymyalgia rheumatica: Secondary | ICD-10-CM | POA: Diagnosis not present

## 2018-10-14 DIAGNOSIS — R7309 Other abnormal glucose: Secondary | ICD-10-CM | POA: Diagnosis not present

## 2018-10-14 DIAGNOSIS — H04123 Dry eye syndrome of bilateral lacrimal glands: Secondary | ICD-10-CM | POA: Diagnosis not present

## 2018-10-14 DIAGNOSIS — H2513 Age-related nuclear cataract, bilateral: Secondary | ICD-10-CM | POA: Diagnosis not present

## 2018-10-14 DIAGNOSIS — H35013 Changes in retinal vascular appearance, bilateral: Secondary | ICD-10-CM | POA: Diagnosis not present

## 2018-11-27 DIAGNOSIS — R5383 Other fatigue: Secondary | ICD-10-CM | POA: Diagnosis not present

## 2018-11-27 DIAGNOSIS — E7800 Pure hypercholesterolemia, unspecified: Secondary | ICD-10-CM | POA: Diagnosis not present

## 2018-11-27 DIAGNOSIS — Z79899 Other long term (current) drug therapy: Secondary | ICD-10-CM | POA: Diagnosis not present

## 2018-11-27 DIAGNOSIS — Z0000 Encounter for general adult medical examination without abnormal findings: Secondary | ICD-10-CM | POA: Diagnosis not present

## 2018-11-27 DIAGNOSIS — Z1339 Encounter for screening examination for other mental health and behavioral disorders: Secondary | ICD-10-CM | POA: Diagnosis not present

## 2018-11-27 DIAGNOSIS — E1165 Type 2 diabetes mellitus with hyperglycemia: Secondary | ICD-10-CM | POA: Diagnosis not present

## 2018-11-27 DIAGNOSIS — Z1331 Encounter for screening for depression: Secondary | ICD-10-CM | POA: Diagnosis not present

## 2018-11-27 DIAGNOSIS — Z7189 Other specified counseling: Secondary | ICD-10-CM | POA: Diagnosis not present

## 2018-11-27 DIAGNOSIS — Z299 Encounter for prophylactic measures, unspecified: Secondary | ICD-10-CM | POA: Diagnosis not present

## 2018-11-27 DIAGNOSIS — R300 Dysuria: Secondary | ICD-10-CM | POA: Diagnosis not present

## 2018-11-27 DIAGNOSIS — Z6825 Body mass index (BMI) 25.0-25.9, adult: Secondary | ICD-10-CM | POA: Diagnosis not present

## 2019-01-14 DIAGNOSIS — Z6825 Body mass index (BMI) 25.0-25.9, adult: Secondary | ICD-10-CM | POA: Diagnosis not present

## 2019-01-14 DIAGNOSIS — D509 Iron deficiency anemia, unspecified: Secondary | ICD-10-CM | POA: Diagnosis not present

## 2019-01-14 DIAGNOSIS — E1165 Type 2 diabetes mellitus with hyperglycemia: Secondary | ICD-10-CM | POA: Diagnosis not present

## 2019-01-14 DIAGNOSIS — M353 Polymyalgia rheumatica: Secondary | ICD-10-CM | POA: Diagnosis not present

## 2019-01-14 DIAGNOSIS — Z299 Encounter for prophylactic measures, unspecified: Secondary | ICD-10-CM | POA: Diagnosis not present

## 2019-01-15 DIAGNOSIS — R69 Illness, unspecified: Secondary | ICD-10-CM | POA: Diagnosis not present

## 2019-03-13 DIAGNOSIS — R69 Illness, unspecified: Secondary | ICD-10-CM | POA: Diagnosis not present

## 2019-03-17 DIAGNOSIS — R69 Illness, unspecified: Secondary | ICD-10-CM | POA: Diagnosis not present

## 2019-04-22 DIAGNOSIS — Z6825 Body mass index (BMI) 25.0-25.9, adult: Secondary | ICD-10-CM | POA: Diagnosis not present

## 2019-04-22 DIAGNOSIS — E1165 Type 2 diabetes mellitus with hyperglycemia: Secondary | ICD-10-CM | POA: Diagnosis not present

## 2019-04-22 DIAGNOSIS — Z299 Encounter for prophylactic measures, unspecified: Secondary | ICD-10-CM | POA: Diagnosis not present

## 2019-04-22 DIAGNOSIS — E1129 Type 2 diabetes mellitus with other diabetic kidney complication: Secondary | ICD-10-CM | POA: Diagnosis not present

## 2019-04-22 DIAGNOSIS — M353 Polymyalgia rheumatica: Secondary | ICD-10-CM | POA: Diagnosis not present

## 2019-04-22 DIAGNOSIS — R809 Proteinuria, unspecified: Secondary | ICD-10-CM | POA: Diagnosis not present

## 2019-07-28 DIAGNOSIS — R69 Illness, unspecified: Secondary | ICD-10-CM | POA: Diagnosis not present

## 2019-07-29 DIAGNOSIS — M353 Polymyalgia rheumatica: Secondary | ICD-10-CM | POA: Diagnosis not present

## 2019-07-29 DIAGNOSIS — E1165 Type 2 diabetes mellitus with hyperglycemia: Secondary | ICD-10-CM | POA: Diagnosis not present

## 2019-07-29 DIAGNOSIS — E7800 Pure hypercholesterolemia, unspecified: Secondary | ICD-10-CM | POA: Diagnosis not present

## 2019-07-29 DIAGNOSIS — Z299 Encounter for prophylactic measures, unspecified: Secondary | ICD-10-CM | POA: Diagnosis not present

## 2019-09-16 DIAGNOSIS — R69 Illness, unspecified: Secondary | ICD-10-CM | POA: Diagnosis not present

## 2019-10-03 DIAGNOSIS — Z299 Encounter for prophylactic measures, unspecified: Secondary | ICD-10-CM | POA: Diagnosis not present

## 2019-10-03 DIAGNOSIS — E1129 Type 2 diabetes mellitus with other diabetic kidney complication: Secondary | ICD-10-CM | POA: Diagnosis not present

## 2019-10-03 DIAGNOSIS — R809 Proteinuria, unspecified: Secondary | ICD-10-CM | POA: Diagnosis not present

## 2019-10-03 DIAGNOSIS — U071 COVID-19: Secondary | ICD-10-CM | POA: Diagnosis not present

## 2019-10-13 DIAGNOSIS — R69 Illness, unspecified: Secondary | ICD-10-CM | POA: Diagnosis not present

## 2019-11-12 ENCOUNTER — Encounter (INDEPENDENT_AMBULATORY_CARE_PROVIDER_SITE_OTHER): Payer: Self-pay | Admitting: *Deleted

## 2019-11-12 DIAGNOSIS — R69 Illness, unspecified: Secondary | ICD-10-CM | POA: Diagnosis not present

## 2019-11-14 DIAGNOSIS — R7309 Other abnormal glucose: Secondary | ICD-10-CM | POA: Diagnosis not present

## 2019-11-14 DIAGNOSIS — H25013 Cortical age-related cataract, bilateral: Secondary | ICD-10-CM | POA: Diagnosis not present

## 2019-11-14 DIAGNOSIS — H2513 Age-related nuclear cataract, bilateral: Secondary | ICD-10-CM | POA: Diagnosis not present

## 2019-11-14 DIAGNOSIS — Z9889 Other specified postprocedural states: Secondary | ICD-10-CM | POA: Diagnosis not present

## 2019-11-21 ENCOUNTER — Inpatient Hospital Stay (HOSPITAL_BASED_OUTPATIENT_CLINIC_OR_DEPARTMENT_OTHER)
Admission: EM | Admit: 2019-11-21 | Discharge: 2019-11-25 | DRG: 871 | Disposition: A | Discharge status: Home / Self Care | Payer: Medicare HMO | Attending: Internal Medicine | Admitting: Internal Medicine

## 2019-11-21 ENCOUNTER — Other Ambulatory Visit: Payer: Self-pay

## 2019-11-21 ENCOUNTER — Encounter (HOSPITAL_BASED_OUTPATIENT_CLINIC_OR_DEPARTMENT_OTHER): Payer: Self-pay

## 2019-11-21 ENCOUNTER — Emergency Department (HOSPITAL_BASED_OUTPATIENT_CLINIC_OR_DEPARTMENT_OTHER): Payer: Medicare HMO

## 2019-11-21 DIAGNOSIS — I82402 Acute embolism and thrombosis of unspecified deep veins of left lower extremity: Secondary | ICD-10-CM | POA: Diagnosis not present

## 2019-11-21 DIAGNOSIS — I2510 Atherosclerotic heart disease of native coronary artery without angina pectoris: Secondary | ICD-10-CM | POA: Diagnosis present

## 2019-11-21 DIAGNOSIS — Z833 Family history of diabetes mellitus: Secondary | ICD-10-CM | POA: Diagnosis not present

## 2019-11-21 DIAGNOSIS — I50810 Right heart failure, unspecified: Secondary | ICD-10-CM | POA: Diagnosis not present

## 2019-11-21 DIAGNOSIS — Z9049 Acquired absence of other specified parts of digestive tract: Secondary | ICD-10-CM | POA: Diagnosis not present

## 2019-11-21 DIAGNOSIS — J189 Pneumonia, unspecified organism: Secondary | ICD-10-CM | POA: Diagnosis not present

## 2019-11-21 DIAGNOSIS — R059 Cough, unspecified: Secondary | ICD-10-CM | POA: Diagnosis not present

## 2019-11-21 DIAGNOSIS — R6520 Severe sepsis without septic shock: Secondary | ICD-10-CM | POA: Diagnosis not present

## 2019-11-21 DIAGNOSIS — I700 Atherosclerosis of aorta: Secondary | ICD-10-CM | POA: Diagnosis present

## 2019-11-21 DIAGNOSIS — R0602 Shortness of breath: Secondary | ICD-10-CM | POA: Diagnosis not present

## 2019-11-21 DIAGNOSIS — E8770 Fluid overload, unspecified: Secondary | ICD-10-CM | POA: Diagnosis present

## 2019-11-21 DIAGNOSIS — M25471 Effusion, right ankle: Secondary | ICD-10-CM | POA: Diagnosis present

## 2019-11-21 DIAGNOSIS — Z86718 Personal history of other venous thrombosis and embolism: Secondary | ICD-10-CM

## 2019-11-21 DIAGNOSIS — R52 Pain, unspecified: Secondary | ICD-10-CM

## 2019-11-21 DIAGNOSIS — R7303 Prediabetes: Secondary | ICD-10-CM

## 2019-11-21 DIAGNOSIS — Z87891 Personal history of nicotine dependence: Secondary | ICD-10-CM

## 2019-11-21 DIAGNOSIS — J154 Pneumonia due to other streptococci: Secondary | ICD-10-CM | POA: Diagnosis not present

## 2019-11-21 DIAGNOSIS — J9601 Acute respiratory failure with hypoxia: Secondary | ICD-10-CM | POA: Diagnosis not present

## 2019-11-21 DIAGNOSIS — M7989 Other specified soft tissue disorders: Secondary | ICD-10-CM | POA: Diagnosis not present

## 2019-11-21 DIAGNOSIS — M25512 Pain in left shoulder: Secondary | ICD-10-CM | POA: Diagnosis not present

## 2019-11-21 DIAGNOSIS — I517 Cardiomegaly: Secondary | ICD-10-CM | POA: Diagnosis not present

## 2019-11-21 DIAGNOSIS — R000 Tachycardia, unspecified: Secondary | ICD-10-CM | POA: Diagnosis not present

## 2019-11-21 DIAGNOSIS — A419 Sepsis, unspecified organism: Principal | ICD-10-CM

## 2019-11-21 DIAGNOSIS — Z8616 Personal history of COVID-19: Secondary | ICD-10-CM

## 2019-11-21 DIAGNOSIS — Z86711 Personal history of pulmonary embolism: Secondary | ICD-10-CM | POA: Diagnosis not present

## 2019-11-21 DIAGNOSIS — M25472 Effusion, left ankle: Secondary | ICD-10-CM | POA: Diagnosis present

## 2019-11-21 DIAGNOSIS — I313 Pericardial effusion (noninflammatory): Secondary | ICD-10-CM | POA: Diagnosis not present

## 2019-11-21 DIAGNOSIS — Z809 Family history of malignant neoplasm, unspecified: Secondary | ICD-10-CM | POA: Diagnosis not present

## 2019-11-21 DIAGNOSIS — J90 Pleural effusion, not elsewhere classified: Secondary | ICD-10-CM | POA: Diagnosis not present

## 2019-11-21 DIAGNOSIS — Z20822 Contact with and (suspected) exposure to covid-19: Secondary | ICD-10-CM | POA: Diagnosis present

## 2019-11-21 DIAGNOSIS — E440 Moderate protein-calorie malnutrition: Secondary | ICD-10-CM | POA: Insufficient documentation

## 2019-11-21 DIAGNOSIS — M79609 Pain in unspecified limb: Secondary | ICD-10-CM | POA: Diagnosis not present

## 2019-11-21 DIAGNOSIS — Z8249 Family history of ischemic heart disease and other diseases of the circulatory system: Secondary | ICD-10-CM | POA: Diagnosis not present

## 2019-11-21 DIAGNOSIS — I82452 Acute embolism and thrombosis of left peroneal vein: Secondary | ICD-10-CM | POA: Diagnosis not present

## 2019-11-21 DIAGNOSIS — D509 Iron deficiency anemia, unspecified: Secondary | ICD-10-CM

## 2019-11-21 DIAGNOSIS — I2584 Coronary atherosclerosis due to calcified coronary lesion: Secondary | ICD-10-CM | POA: Diagnosis not present

## 2019-11-21 DIAGNOSIS — I3139 Other pericardial effusion (noninflammatory): Secondary | ICD-10-CM | POA: Diagnosis present

## 2019-11-21 HISTORY — DX: Pneumonia, unspecified organism: J18.9

## 2019-11-21 HISTORY — DX: Prediabetes: R73.03

## 2019-11-21 LAB — URINALYSIS, ROUTINE W REFLEX MICROSCOPIC
Bilirubin Urine: NEGATIVE
Glucose, UA: NEGATIVE mg/dL
Hgb urine dipstick: NEGATIVE
Ketones, ur: NEGATIVE mg/dL
Leukocytes,Ua: NEGATIVE
Nitrite: NEGATIVE
Protein, ur: NEGATIVE mg/dL
Specific Gravity, Urine: 1.01 (ref 1.005–1.030)
pH: 5.5 (ref 5.0–8.0)

## 2019-11-21 LAB — CBC WITH DIFFERENTIAL/PLATELET
Abs Immature Granulocytes: 0.06 10*3/uL (ref 0.00–0.07)
Basophils Absolute: 0 10*3/uL (ref 0.0–0.1)
Basophils Relative: 0 %
Eosinophils Absolute: 0 10*3/uL (ref 0.0–0.5)
Eosinophils Relative: 0 %
HCT: 30.9 % — ABNORMAL LOW (ref 36.0–46.0)
Hemoglobin: 9.2 g/dL — ABNORMAL LOW (ref 12.0–15.0)
Immature Granulocytes: 0 %
Lymphocytes Relative: 11 %
Lymphs Abs: 1.5 10*3/uL (ref 0.7–4.0)
MCH: 22.8 pg — ABNORMAL LOW (ref 26.0–34.0)
MCHC: 29.8 g/dL — ABNORMAL LOW (ref 30.0–36.0)
MCV: 76.5 fL — ABNORMAL LOW (ref 80.0–100.0)
Monocytes Absolute: 1.2 10*3/uL — ABNORMAL HIGH (ref 0.1–1.0)
Monocytes Relative: 9 %
Neutro Abs: 10.7 10*3/uL — ABNORMAL HIGH (ref 1.7–7.7)
Neutrophils Relative %: 80 %
Platelets: 364 10*3/uL (ref 150–400)
RBC: 4.04 MIL/uL (ref 3.87–5.11)
RDW: 17.9 % — ABNORMAL HIGH (ref 11.5–15.5)
WBC: 13.6 10*3/uL — ABNORMAL HIGH (ref 4.0–10.5)
nRBC: 0 % (ref 0.0–0.2)

## 2019-11-21 LAB — COMPREHENSIVE METABOLIC PANEL
ALT: 11 U/L (ref 0–44)
AST: 25 U/L (ref 15–41)
Albumin: 2.8 g/dL — ABNORMAL LOW (ref 3.5–5.0)
Alkaline Phosphatase: 58 U/L (ref 38–126)
Anion gap: 9 (ref 5–15)
BUN: 15 mg/dL (ref 8–23)
CO2: 22 mmol/L (ref 22–32)
Calcium: 8.2 mg/dL — ABNORMAL LOW (ref 8.9–10.3)
Chloride: 101 mmol/L (ref 98–111)
Creatinine, Ser: 0.86 mg/dL (ref 0.44–1.00)
GFR, Estimated: >60 mL/min (ref >60)
Glucose, Bld: 214 mg/dL — ABNORMAL HIGH (ref 70–99)
Potassium: 4.2 mmol/L (ref 3.5–5.1)
Sodium: 132 mmol/L — ABNORMAL LOW (ref 135–145)
Total Bilirubin: 0.7 mg/dL (ref 0.3–1.2)
Total Protein: 7.3 g/dL (ref 6.5–8.1)

## 2019-11-21 LAB — LACTIC ACID, PLASMA: Lactic Acid, Venous: 1.5 mmol/L (ref 0.5–1.9)

## 2019-11-21 LAB — RESPIRATORY PANEL BY RT PCR (FLU A&B, COVID)
Influenza A by PCR: NEGATIVE
Influenza B by PCR: NEGATIVE
SARS Coronavirus 2 by RT PCR: NEGATIVE

## 2019-11-21 LAB — CBG MONITORING, ED: Glucose-Capillary: 166 mg/dL — ABNORMAL HIGH (ref 70–99)

## 2019-11-21 LAB — BRAIN NATRIURETIC PEPTIDE: B Natriuretic Peptide: 492.4 pg/mL — ABNORMAL HIGH (ref 0.0–100.0)

## 2019-11-21 MED ORDER — ONDANSETRON HCL 4 MG PO TABS: 4.0000 mg | ORAL_TABLET | Freq: Four times a day (QID) | ORAL | Status: DC | PRN

## 2019-11-21 MED ORDER — SODIUM CHLORIDE 0.9 % IV SOLN
2.0000 g | INTRAVENOUS | Status: DC
  Administered 2019-11-22 – 2019-11-24 (×3): 2 g via INTRAVENOUS
  Filled 2019-11-21: qty 2
  Filled 2019-11-21 (×3): qty 20

## 2019-11-21 MED ORDER — SODIUM CHLORIDE 0.9 % IV SOLN
1.0000 g | Freq: Once | INTRAVENOUS | Status: AC
  Administered 2019-11-21: 1 g via INTRAVENOUS
  Filled 2019-11-21: qty 10

## 2019-11-21 MED ORDER — SODIUM CHLORIDE 0.9 % IV SOLN
250.0000 mL | INTRAVENOUS | Status: DC | PRN
  Administered 2019-11-22: 250 mL via INTRAVENOUS

## 2019-11-21 MED ORDER — ACETAMINOPHEN 500 MG PO TABS
1000.0000 mg | ORAL_TABLET | Freq: Once | ORAL | Status: AC
  Administered 2019-11-21: 1000 mg via ORAL
  Filled 2019-11-21: qty 2

## 2019-11-21 MED ORDER — SODIUM CHLORIDE 0.9 % IV SOLN
500.0000 mg | INTRAVENOUS | Status: DC
  Administered 2019-11-22 – 2019-11-24 (×3): 500 mg via INTRAVENOUS
  Filled 2019-11-21 (×4): qty 500

## 2019-11-21 MED ORDER — SODIUM CHLORIDE 0.9% FLUSH
3.0000 mL | Freq: Two times a day (BID) | INTRAVENOUS | Status: DC
  Administered 2019-11-22 – 2019-11-24 (×3): 3 mL via INTRAVENOUS

## 2019-11-21 MED ORDER — HYDROCODONE-ACETAMINOPHEN 5-325 MG PO TABS
1.0000 | ORAL_TABLET | Freq: Four times a day (QID) | ORAL | Status: DC | PRN
  Administered 2019-11-22 (×2): 1 via ORAL
  Filled 2019-11-21 (×3): qty 1

## 2019-11-21 MED ORDER — ONDANSETRON HCL 4 MG/2ML IJ SOLN: 4.0000 mg | Freq: Four times a day (QID) | INTRAMUSCULAR | Status: DC | PRN

## 2019-11-21 MED ORDER — SODIUM CHLORIDE 0.9% FLUSH
3.0000 mL | Freq: Two times a day (BID) | INTRAVENOUS | Status: DC
  Administered 2019-11-22 – 2019-11-24 (×4): 3 mL via INTRAVENOUS

## 2019-11-21 MED ORDER — ACETAMINOPHEN 325 MG PO TABS
650.0000 mg | ORAL_TABLET | Freq: Four times a day (QID) | ORAL | Status: DC | PRN
  Administered 2019-11-22 – 2019-11-23 (×4): 650 mg via ORAL
  Filled 2019-11-21 (×3): qty 2

## 2019-11-21 MED ORDER — INSULIN ASPART 100 UNIT/ML ~~LOC~~ SOLN: 0.0000 [IU] | Freq: Every day | SUBCUTANEOUS | Status: DC

## 2019-11-21 MED ORDER — IOHEXOL 350 MG/ML SOLN
100.0000 mL | Freq: Once | INTRAVENOUS | Status: AC | PRN
  Administered 2019-11-21: 74 mL via INTRAVENOUS

## 2019-11-21 MED ORDER — POLYETHYLENE GLYCOL 3350 17 G PO PACK: 17.0000 g | PACK | Freq: Every day | ORAL | Status: DC | PRN

## 2019-11-21 MED ORDER — ENOXAPARIN SODIUM 40 MG/0.4ML ~~LOC~~ SOLN
40.0000 mg | SUBCUTANEOUS | Status: DC
  Administered 2019-11-21 – 2019-11-22 (×2): 40 mg via SUBCUTANEOUS
  Filled 2019-11-21 (×2): qty 0.4

## 2019-11-21 MED ORDER — INSULIN ASPART 100 UNIT/ML ~~LOC~~ SOLN: 0.0000 [IU] | Freq: Three times a day (TID) | SUBCUTANEOUS | Status: DC

## 2019-11-21 MED ORDER — SODIUM CHLORIDE 0.9% FLUSH
3.0000 mL | INTRAVENOUS | Status: DC | PRN
  Administered 2019-11-23: 3 mL via INTRAVENOUS

## 2019-11-21 MED ORDER — ACETAMINOPHEN 650 MG RE SUPP: 650.0000 mg | Freq: Four times a day (QID) | RECTAL | Status: DC | PRN

## 2019-11-21 MED ORDER — SODIUM CHLORIDE 0.9 % IV SOLN: INTRAVENOUS | Status: DC | PRN

## 2019-11-21 MED ORDER — SODIUM CHLORIDE 0.9 % IV SOLN
500.0000 mg | Freq: Once | INTRAVENOUS | Status: AC
  Administered 2019-11-21: 500 mg via INTRAVENOUS
  Filled 2019-11-21: qty 500

## 2019-11-21 NOTE — ED Triage Notes (Addendum)
Pt c/o cough/fever x 2 days-states she had +covid home test 10/11/19-states she called PCP and rx meds called in-pt reports no covid vaccine and no antibodies infusion-NAD-to triage in w/c

## 2019-11-21 NOTE — Progress Notes (Signed)
Patient with history of recent Covid infection in September, presented with cough shortness of breath.  Physical examination on patient tachycardia tachypneic, new leukocytosis and moderate pericardial effusion.  Discussed with [place] PA, patient will need stat echo to rule out tamponade.  Agreed with ED to ED transfer.  Stat echo ordered.

## 2019-11-21 NOTE — ED Notes (Signed)
Pt arrived from [place] for admissiond. Pt c/o cough, SOB and back pain, weakness and decreased energy x2 days. P

## 2019-11-21 NOTE — ED Provider Notes (Signed)
[facility] EMERGENCY DEPARTMENT Provider Note   CSN: [id number] Arrival date & time: 11/21/19  1317     History Chief Complaint  Patient presents with  . Cough    Jacqueline Harris is a 74 y.o. female who presents for evaluation of cough, fever, shortness of breath, generalized body aches that has been ongoing for the last 2 days.  She noted that she started feeling poorly about 2 days ago.  She felt fatigued and tired.  Yesterday, she noticed a fever of 102.6.  She states that she started developing a cough and felt like she was having some difficulty breathing.  She states she has felt nauseous.  She states she has had some soreness in her chest and her back.  She states cough is productive of clear phlegm.  No hemoptysis.  She was recently diagnosed with COVID-19 in September 2021.  She states she was prescribed medications by her doctor.  She has not gotten vaccinated denies any history of monoclonal antibody infusions.  She has not gotten retested.  She states she has started to feel better after about a week or so but then states over the last 2 days, she has felt sick again.  She was worried that she might have pneumonia.  She has not had any abdominal pain, vomiting.  She states she has had some swelling of her left foot but states that is normal for her.  No changes or worsening swelling.  No overlying warmth, erythema.  She is not on hormone replacement therapy, denies any history of recent surgeries, travel.  No recent hospital admissions.  The history is provided by the patient.       Past Medical History:  Diagnosis Date  . Prediabetes     Patient Active Problem List   Diagnosis Date Noted  . Multifocal pneumonia 11/21/2019  . Pericardial effusion 11/21/2019  . Iron deficiency anemia 11/21/2019  . Prediabetes   . Acute respiratory failure with hypoxia ([place])   . Sepsis due to pneumonia [facility])     Past Surgical History:  Procedure Laterality Date  .  CHOLECYSTECTOMY    . COLONOSCOPY N/A 12/03/2014   Procedure: COLONOSCOPY;  Surgeon: Rogene Houston, MD;  Location: [facility] SUITE;  Service: Endoscopy;  Laterality: N/A;  830     OB History   No obstetric history on file.     Family History  Problem Relation Age of Onset  . Cancer - Lung Mother   . Cancer Father   . Cancer - Colon Brother   . Diabetes Sister   . Hypertension Sister     Social History   Tobacco Use  . Smoking status: Former Research scientist (life sciences)  . Smokeless tobacco: Never Used  Vaping Use  . Vaping Use: Never used  Substance Use Topics  . Alcohol use: No  . Drug use: No    Home Medications Prior to Admission medications   Medication Sig Start Date End Date Taking? Authorizing Provider  ascorbic acid (VITAMIN C) 500 MG tablet Take 500-1,000 mg by mouth daily.   Yes [provider]  Cholecalciferol (VITAMIN D-3 PO) Take 1 capsule by mouth daily.   Yes [provider]  Cyanocobalamin (VITAMIN B-12 PO) Take 1 tablet by mouth daily.   Yes [provider]  lisinopril (ZESTRIL) 2.5 MG tablet Take 2.5 mg by mouth daily.   Yes [provider]  Multiple Vitamins-Minerals (ZINC PO) Take 1 tablet by mouth daily.   Yes [provider]    Allergies    Patient has no known allergies.  Review of Systems   Review of Systems  Constitutional: Positive for fatigue and fever.  Respiratory: Positive for cough and shortness of breath.   Cardiovascular: Positive for chest pain.  Gastrointestinal: Positive for nausea. Negative for abdominal pain and vomiting.  Genitourinary: Negative for dysuria and hematuria.  Neurological: Negative for headaches.  All other systems reviewed and are negative.   Physical Exam Updated Vital Signs BP (!) 112/97   Pulse 99   Temp 98.8 F (37.1 C) (Oral)   Resp (!) 35   Ht _0  (1.626 m)   Wt 63.5 kg   SpO2 98%   BMI 24.03 kg/m   Physical Exam Vitals and nursing note reviewed.  Constitutional:        Appearance: Normal appearance. She is well-developed.  HENT:     Head: Normocephalic and atraumatic.  Eyes:     General: Lids are normal.     Conjunctiva/sclera: Conjunctivae normal.     Pupils: Pupils are equal, round, and reactive to light.  Cardiovascular:     Rate and Rhythm: Normal rate and regular rhythm.     Pulses: Normal pulses.     Heart sounds: Normal heart sounds. No murmur heard.  No friction rub. No gallop.   Pulmonary:     Effort: Pulmonary effort is normal.     Breath sounds: Rales present.     Comments: Rales noted to bilateral bases, left greater than right.  Left side seems to extend up to the midlung fields.  No evidence of respiratory distress. Abdominal:     Palpations: Abdomen is soft. Abdomen is not rigid.     Tenderness: There is no abdominal tenderness. There is no guarding.     Comments: Abdomen is soft, non-distended, non-tender. No rigidity, No guarding. No peritoneal signs.  Musculoskeletal:        General: Normal range of motion.     Cervical back: Full passive range of motion without pain.     Comments: Bilateral lower extremities are without any overlying warmth, erythema, edema.  Skin:    General: Skin is warm and dry.     Capillary Refill: Capillary refill takes less than 2 seconds.  Neurological:     Mental Status: She is alert and oriented to person, place, and time.  Psychiatric:        Speech: Speech normal.     ED Results / Procedures / Treatments   Labs (all labs ordered are listed, but only abnormal results are displayed) Labs Reviewed  COMPREHENSIVE METABOLIC PANEL - Abnormal; Notable for the following components:      Result Value   Sodium 132 (*)    Glucose, Bld 214 (*)    Calcium 8.2 (*)    Albumin 2.8 (*)    All other components within normal limits  CBC WITH DIFFERENTIAL/PLATELET - Abnormal; Notable for the following components:   WBC 13.6 (*)    Hemoglobin 9.2 (*)    HCT 30.9 (*)    MCV 76.5 (*)    MCH 22.8 (*)     MCHC 29.8 (*)    RDW 17.9 (*)    Neutro Abs 10.7 (*)    Monocytes Absolute 1.2 (*)    All other components within normal limits  BRAIN NATRIURETIC PEPTIDE - Abnormal; Notable for the following components:   B Natriuretic Peptide 492.4 (*)    All other components within normal limits  CBG  MONITORING, ED - Abnormal; Notable for the following components:   Glucose-Capillary 166 (*)    All other components within normal limits  RESPIRATORY PANEL BY RT PCR (FLU A&B, COVID)  CULTURE, BLOOD (ROUTINE X 2)  CULTURE, BLOOD (ROUTINE X 2)  EXPECTORATED SPUTUM ASSESSMENT W REFEX TO RESP CULTURE  EXPECTORATED SPUTUM ASSESSMENT W REFEX TO RESP CULTURE  URINALYSIS, ROUTINE W REFLEX MICROSCOPIC  LACTIC ACID, PLASMA  HEMOGLOBIN M2N  BASIC METABOLIC PANEL  CBC  STREP PNEUMONIAE URINARY ANTIGEN  LEGIONELLA PNEUMOPHILA SEROGP 1 UR AG  PROCALCITONIN  PROCALCITONIN  TSH    EKG EKG Interpretation  Date/Time:  Friday November 21 2019 15:05:01 EDT Ventricular Rate:  101 PR Interval:    QRS Duration: 88 QT Interval:  348 QTC Calculation: 452 R Axis:   -87 Text Interpretation: Sinus tachycardia Left anterior fascicular block Abnormal R-wave progression, late transition No old tracing to compare Confirmed by Malvin Johns [phone]) on 11/21/2019 7:58:51 PM   Radiology CT Angio Chest PE W and/or Wo Contrast  Result Date: 11/21/2019 CLINICAL DATA:  Shortness of breath EXAM: CT ANGIOGRAPHY CHEST WITH CONTRAST TECHNIQUE: Multidetector CT imaging of the chest was performed using the standard protocol during bolus administration of intravenous contrast. Multiplanar CT image reconstructions and MIPs were obtained to evaluate the vascular anatomy. CONTRAST:  73m OMNIPAQUE IOHEXOL 350 MG/ML SOLN COMPARISON:  None. FINDINGS: Cardiovascular: There is a optimal opacification of the pulmonary arteries. There is no central,segmental, or subsegmental filling defects within the pulmonary arteries. There is mild  cardiomegaly. A moderate pericardial effusion is seen. No evidence right heart strain. There is normal three-vessel brachiocephalic anatomy without proximal stenosis. Scattered aortic atherosclerosis is seen. There is coronary artery calcifications present. Mediastinum/Nodes: No hilar, mediastinal, or axillary adenopathy. Thyroid gland, trachea, and esophagus demonstrate no significant findings. Lungs/Pleura: Patchy airspace opacities are seen at the posterior bilateral lung bases. There is also a small amount of patchy airspace opacity at the posterior left upper lobe. There is a trace left pleural effusion present. Upper Abdomen: No acute abnormalities present in the visualized portions of the upper abdomen. Musculoskeletal: No chest wall abnormality. No acute or significant osseous findings. Review of the MIP images confirms the above findings. IMPRESSION: No central, segmental, or subsegmental pulmonary embolism. Moderate pericardial effusion with mild cardiomegaly. No evidence of right ventricular heart strain. Multifocal patchy airspace opacities, predominantly within both lung bases which may be due to infectious etiology. Trace left pleural effusion. Aortic Atherosclerosis (ICD10-I70.0). Electronically Signed   By: BPrudencio PairM.D.   On: 11/21/2019 17:10   DG Chest Portable 1 View  Result Date: 11/21/2019 CLINICAL DATA:  Cough, fever, shortness of breath, COVID-19 on 10/11/2019 EXAM: PORTABLE CHEST 1 VIEW COMPARISON:  Portable exam 1334 hours without priors available for comparison FINDINGS: Enlargement of cardiac silhouette with slight pulmonary vascular congestion. Central pulmonary arterial enlargement at the LEFT hilum. Interstitial infiltrates at the mid to lower lungs greater at LEFT base laterally, could represent asymmetric edema over infection. No pleural effusion or pneumothorax. Bones demineralized. IMPRESSION: Enlargement of cardiac silhouette with slight vascular congestion. Bibasilar  infiltrates slightly greater on LEFT, could represent asymmetric pulmonary edema or pneumonia. Electronically Signed   By: MLavonia DanaM.D.   On: 11/21/2019 13:58    Procedures Procedures (including critical care time)  Medications Ordered in ED Medications  0.9 %  sodium chloride infusion ( Intravenous New Bag/Given 11/21/19 1730)  cefTRIAXone (ROCEPHIN) 2 g in sodium chloride 0.9 % 100 mL IVPB (has no  administration in time range)  azithromycin (ZITHROMAX) 500 mg in sodium chloride 0.9 % 250 mL IVPB (has no administration in time range)  insulin aspart (novoLOG) injection 0-9 Units (has no administration in time range)  insulin aspart (novoLOG) injection 0-5 Units (0 Units Subcutaneous Not Given 11/21/19 2347)  enoxaparin (LOVENOX) injection 40 mg (40 mg Subcutaneous Given 11/21/19 2358)  sodium chloride flush (NS) 0.9 % injection 3 mL (has no administration in time range)  sodium chloride flush (NS) 0.9 % injection 3 mL (has no administration in time range)  sodium chloride flush (NS) 0.9 % injection 3 mL (has no administration in time range)  0.9 %  sodium chloride infusion (has no administration in time range)  acetaminophen (TYLENOL) tablet 650 mg (has no administration in time range)    Or  acetaminophen (TYLENOL) suppository 650 mg (has no administration in time range)  HYDROcodone-acetaminophen (NORCO/VICODIN) 5-325 MG per tablet 1 tablet (has no administration in time range)  polyethylene glycol (MIRALAX / GLYCOLAX) packet 17 g (has no administration in time range)  ondansetron (ZOFRAN) tablet 4 mg (has no administration in time range)    Or  ondansetron (ZOFRAN) injection 4 mg (has no administration in time range)  iohexol (OMNIPAQUE) 350 MG/ML injection 100 mL (74 mLs Intravenous Contrast Given 11/21/19 1634)  cefTRIAXone (ROCEPHIN) 1 g in sodium chloride 0.9 % 100 mL IVPB (0 g Intravenous Stopped 11/21/19 1806)  azithromycin (ZITHROMAX) 500 mg in sodium chloride 0.9 % 250 mL IVPB  (500 mg Intravenous New Bag/Given 11/21/19 1819)  acetaminophen (TYLENOL) tablet 1,000 mg (1,000 mg Oral Given 11/21/19 1848)    ED Course  I have reviewed the triage vital signs and the nursing notes.  Pertinent labs & imaging results that were available during my care of the patient were reviewed by me and considered in my medical decision making (see chart for details).    MDM Rules/Calculators/A&P                          74 year old female who presents for evaluation of 2 days of fever, generalized weakness, cough, shortness of breath.  Recent COVID-19 diagnosis in September 2021.  Had been feeling better up until 2 days ago.  States she has been coughing up clear phlegm.  No hemoptysis.  Nausea arrival, she is afebrile but is slightly tachycardic.  Vitals otherwise stable.  On exam, she has crackles noted to bilateral bases.  No abdominal tenderness.  Concern for possible infectious process versus COVID-19.  Plan to check chest x-ray, labs.  Chest x-ray shows enlargement of cardiac silhouette with slight vascular congestion.  Bibasilar infiltrate slightly greater on the left.  Could represent asymmetric pulmonary edema versus pneumonia.  CBC shows leukocytosis of 13.6. Hgb is 9.52.  CMP shows glucose of 214.   We attempted to ambulate patient.  She was 90% sitting there on the bed.  As she ambulated around, she dropped down to about 87-88.  As a place her back on the bed, she is maintaining O2 sats 88%.  I went to discuss with her regarding her findings here in the ED today.  While I was talking to her, she desatted down to about 84%.  She was placed on 2 L nasal cannula which improved her back up into the 90s.  Given the hypoxia as well as tachycardia, shortness of breath, will plan for CTA of chest to evaluate for any potential blood clots.  CTA shows no  evidence of PE.  She does have moderate pericardial effusion with mild cardiomegaly.  She also has multifocal patchy airspace opacities  predominantly within both lung bases.    At this time, patient has new oxygen requirement.  We will plan to treat her for pneumonia.  Given the pericardial effusion as well as vascular congestion, will need an echo during admission.  Patient had documented O2 sats in the high 70s, low 80s.  I personally evaluated patient while this is occurring and she did not have any Plath wave.  I reposition the O2 monitor which had improvement in O2 sats up to 90% on 2 L.  Patient with no distress.  Do not feel that this was a real representative of O2 sats in the 70s-80s.  Discussed patient with Dr. Markus Jarvis (hospitalist) regarding patient. He would like a stat ECHO. Unfortunately at this time, we do not have ECHO here in the ED. At this time he recommends we do ED to ED transfer so that patient can get a echo as soon as possible with plans for admission once she gets over to count.  Discussed patient with Dr. Sherry Ruffing who accepts patient transfer to the [place] ED.   Portions of this note were generated with [employer]. Dictation errors may occur despite best attempts at proofreading.   Final Clinical Impression(s) / ED Diagnoses Final diagnoses:  Community acquired pneumonia, unspecified laterality  Pericardial effusion    Rx / DC Orders ED Discharge Orders    None       Volanda Napoleon, PA-C 11/22/19 0042    Malvin Johns, MD 11/22/19 1520

## 2019-11-21 NOTE — H&P (Signed)
History and Physical    Jacqueline Harris ZYY:[id number] DOB: July 19, 1945 DOA: 11/21/2019  PCP: Glenda Chroman, MD   Patient coming from: Home   Chief Complaint: Cough, SOB, fevers   HPI: Jacqueline Harris is a 74 y.o. female with medical history significant for prediabetes, iron deficiency anemia, and COVID-19 infection in September 2021, now presenting to the emergency department with cough, shortness of breath, and fevers.  Patient reports that she was diagnosed with COVID-19 on 10/11/2019, did not require hospitalization, completely recovered from that, but then developed cough and shortness of breath a few days ago, and then fevers and worsening dyspnea over the past day.  She presented to the emergency department today for evaluation of this.  She denies any chest pain.  She reports new onset of mild ankle edema over the past couple weeks.  ED Course: Upon arrival to the ED, patient is found to be afebrile, saturating 88% on room air, tachypneic, slightly tachycardic, and with stable blood pressure.  EKG features sinus tachycardia with rate 101 and LAFB.  Chest x-ray demonstrates enlarged cardiac silhouette with slight vascular congestion and basilar infiltrates.  CTA chest is negative for PE but notable for moderate pericardial effusion and patchy airspace opacities concerning for multifocal pneumonia.  Chemistry panel is notable for glucose of 214 and albumin 2.8.  CBC features a leukocytosis to 13,600 and a microcytic anemia with hemoglobin 9.2.  Lactic acid is reassuringly normal.  BNP is elevated 492.  Blood cultures were collected in the ED, patient was treated with acetaminophen, Rocephin, and azithromycin, and a stat echocardiogram was ordered but not yet performed.  Review of Systems:  All other systems reviewed and apart from HPI, are negative.  Past Medical History:  Diagnosis Date  . Prediabetes     Past Surgical History:  Procedure Laterality Date  . CHOLECYSTECTOMY    .  COLONOSCOPY N/A 12/03/2014   Procedure: COLONOSCOPY;  Surgeon: Rogene Houston, MD;  Location: [facility];  Service: Endoscopy;  Laterality: N/A;  830    Social History:   reports that she has quit smoking. She has never used smokeless tobacco. She reports that she does not drink alcohol and does not use drugs.  No Known Allergies  Family History  Problem Relation Age of Onset  . Cancer - Lung Mother   . Cancer Father   . Cancer - Colon Brother   . Diabetes Sister   . Hypertension Sister      Prior to Admission medications   Not on File    Physical Exam: Vitals:   11/21/19 1900 11/21/19 1940 11/21/19 2030 11/21/19 2032  BP: 120/67 117/61 (!) 115/47   Pulse:   98   Resp: (!) 25 (!) 24 (!) 42   Temp:  99 F (37.2 C)  98.8 F (37.1 C)  TempSrc:  Oral  Oral  SpO2: 98% 98% 98%   Weight:      Height:        Constitutional: NAD, calm  Eyes: PERTLA, lids and conjunctivae normal ENMT: Mucous membranes are moist. Posterior pharynx clear of any exudate or lesions.   Neck: normal, supple, no masses, no thyromegaly Respiratory: Tachypneic, scattered rhonchi, no wheezing. No pallor or cyanosis.  Cardiovascular: S1 & S2 heard, regular rate and rhythm. Trace pedal edema.   Abdomen: No distension, no tenderness, soft. Bowel sounds active.  Musculoskeletal: no clubbing / cyanosis. No joint deformity upper and lower extremities.   Skin: no significant rashes, lesions,  ulcers. Warm, dry, well-perfused. Neurologic: No gross facial asymmetry. Sensation intact. Moving all extremities.  Psychiatric: Alert and oriented to person, place, and situation. Pleasant and cooperative.    Labs and Imaging on Admission: I have personally reviewed following labs and imaging studies  CBC: Recent Labs  Lab 11/21/19 1445  WBC 13.6*  NEUTROABS 10.7*  HGB 9.2*  HCT 30.9*  MCV 76.5*  PLT 967   Basic Metabolic Panel: Recent Labs  Lab 11/21/19 1445  NA 132*  K 4.2  CL 101  CO2 22    GLUCOSE 214*  BUN 15  CREATININE 0.86  CALCIUM 8.2*   GFR: Estimated Creatinine Clearance: 51.1 mL/min (by C-G formula based on SCr of 0.86 mg/dL). Liver Function Tests: Recent Labs  Lab 11/21/19 1445  AST 25  ALT 11  ALKPHOS 58  BILITOT 0.7  PROT 7.3  ALBUMIN 2.8*   No results for input(s): LIPASE, AMYLASE in the last 168 hours. No results for input(s): AMMONIA in the last 168 hours. Coagulation Profile: No results for input(s): INR, PROTIME in the last 168 hours. Cardiac Enzymes: No results for input(s): CKTOTAL, CKMB, CKMBINDEX, TROPONINI in the last 168 hours. BNP (last 3 results) No results for input(s): PROBNP in the last 8760 hours. HbA1C: No results for input(s): HGBA1C in the last 72 hours. CBG: No results for input(s): GLUCAP in the last 168 hours. Lipid Profile: No results for input(s): CHOL, HDL, LDLCALC, TRIG, CHOLHDL, LDLDIRECT in the last 72 hours. Thyroid Function Tests: No results for input(s): TSH, T4TOTAL, FREET4, T3FREE, THYROIDAB in the last 72 hours. Anemia Panel: No results for input(s): VITAMINB12, FOLATE, FERRITIN, TIBC, IRON, RETICCTPCT in the last 72 hours. Urine analysis:    Component Value Date/Time   COLORURINE YELLOW 11/21/2019 1900   APPEARANCEUR CLEAR 11/21/2019 1900   LABSPEC 1.010 11/21/2019 1900   PHURINE 5.5 11/21/2019 1900   GLUCOSEU NEGATIVE 11/21/2019 1900   HGBUR NEGATIVE 11/21/2019 1900   BILIRUBINUR NEGATIVE 11/21/2019 1900   KETONESUR NEGATIVE 11/21/2019 1900   PROTEINUR NEGATIVE 11/21/2019 1900   NITRITE NEGATIVE 11/21/2019 1900   LEUKOCYTESUR NEGATIVE 11/21/2019 1900   Sepsis Labs: _0 (procalcitonin:4,lacticidven:4) ) Recent Results (from the past 240 hour(s))  Respiratory Panel by RT PCR (Flu A&B, Covid) - Nasopharyngeal Swab     Status: None   Collection Time: 11/21/19  2:52 PM   Specimen: Nasopharyngeal Swab  Result Value Ref Range Status   SARS Coronavirus 2 by RT PCR NEGATIVE NEGATIVE Final     Comment: (NOTE) SARS-CoV-2 target nucleic acids are NOT DETECTED.  The SARS-CoV-2 RNA is generally detectable in upper respiratoy specimens during the acute phase of infection. The lowest concentration of SARS-CoV-2 viral copies this assay can detect is 131 copies/mL. A negative result does not preclude SARS-Cov-2 infection and should not be used as the sole basis for treatment or other patient management decisions. A negative result may occur with  improper specimen collection/handling, submission of specimen other than nasopharyngeal swab, presence of viral mutation(s) within the areas targeted by this assay, and inadequate number of viral copies (<131 copies/mL). A negative result must be combined with clinical observations, patient history, and epidemiological information. The expected result is Negative.  Fact Sheet for Patients:  PinkCheek.be  Fact Sheet for Healthcare Providers:  GravelBags.it  This test is no t yet approved or cleared by the [place] FDA and  has been authorized for detection and/or diagnosis of SARS-CoV-2 by FDA under an Emergency Use Authorization (EUA). This EUA will remain  in effect (meaning this test can be used) for the duration of the COVID-19 declaration under Section 564(b)(1) of the Act, 21 U.S.C. section 360bbb-3(b)(1), unless the authorization is terminated or revoked sooner.     Influenza A by PCR NEGATIVE NEGATIVE Final   Influenza B by PCR NEGATIVE NEGATIVE Final    Comment: (NOTE) The Xpert Xpress SARS-CoV-2/FLU/RSV assay is intended as an aid in  the diagnosis of influenza from Nasopharyngeal swab specimens and  should not be used as a sole basis for treatment. Nasal washings and  aspirates are unacceptable for Xpert Xpress SARS-CoV-2/FLU/RSV  testing.  Fact Sheet for Patients: PinkCheek.be  Fact Sheet for Healthcare  Providers: GravelBags.it  This test is not yet approved or cleared by the [place] FDA and  has been authorized for detection and/or diagnosis of SARS-CoV-2 by  FDA under an Emergency Use Authorization (EUA). This EUA will remain  in effect (meaning this test can be used) for the duration of the  Covid-19 declaration under Section 564(b)(1) of the Act, 21  U.S.C. section 360bbb-3(b)(1), unless the authorization is  terminated or revoked. Performed at [facility], [address] [place], [place] [address]      Radiological Exams on Admission: CT Angio Chest PE W and/or Wo Contrast  Result Date: 11/21/2019 CLINICAL DATA:  Shortness of breath EXAM: CT ANGIOGRAPHY CHEST WITH CONTRAST TECHNIQUE: Multidetector CT imaging of the chest was performed using the standard protocol during bolus administration of intravenous contrast. Multiplanar CT image reconstructions and MIPs were obtained to evaluate the vascular anatomy. CONTRAST:  60m OMNIPAQUE IOHEXOL 350 MG/ML SOLN COMPARISON:  None. FINDINGS: Cardiovascular: There is a optimal opacification of the pulmonary arteries. There is no central,segmental, or subsegmental filling defects within the pulmonary arteries. There is mild cardiomegaly. A moderate pericardial effusion is seen. No evidence right heart strain. There is normal three-vessel brachiocephalic anatomy without proximal stenosis. Scattered aortic atherosclerosis is seen. There is coronary artery calcifications present. Mediastinum/Nodes: No hilar, mediastinal, or axillary adenopathy. Thyroid gland, trachea, and esophagus demonstrate no significant findings. Lungs/Pleura: Patchy airspace opacities are seen at the posterior bilateral lung bases. There is also a small amount of patchy airspace opacity at the posterior left upper lobe. There is a trace left pleural effusion present. Upper Abdomen: No acute abnormalities present in the visualized  portions of the upper abdomen. Musculoskeletal: No chest wall abnormality. No acute or significant osseous findings. Review of the MIP images confirms the above findings. IMPRESSION: No central, segmental, or subsegmental pulmonary embolism. Moderate pericardial effusion with mild cardiomegaly. No evidence of right ventricular heart strain. Multifocal patchy airspace opacities, predominantly within both lung bases which may be due to infectious etiology. Trace left pleural effusion. Aortic Atherosclerosis (ICD10-I70.0). Electronically Signed   By: BPrudencio PairM.D.   On: 11/21/2019 17:10   DG Chest Portable 1 View  Result Date: 11/21/2019 CLINICAL DATA:  Cough, fever, shortness of breath, COVID-19 on 10/11/2019 EXAM: PORTABLE CHEST 1 VIEW COMPARISON:  Portable exam 1334 hours without priors available for comparison FINDINGS: Enlargement of cardiac silhouette with slight pulmonary vascular congestion. Central pulmonary arterial enlargement at the LEFT hilum. Interstitial infiltrates at the mid to lower lungs greater at LEFT base laterally, could represent asymmetric edema over infection. No pleural effusion or pneumothorax. Bones demineralized. IMPRESSION: Enlargement of cardiac silhouette with slight vascular congestion. Bibasilar infiltrates slightly greater on LEFT, could represent asymmetric pulmonary edema or pneumonia. Electronically Signed   By: MLavonia DanaM.D.   On:  11/21/2019 13:58    EKG: Independently reviewed. Sinus tachycardia, rate 101, LAFB.   Assessment/Plan   1. Multifocal pneumonia  - Patient presents with a few days of productive cough and DOE and one day of fevers and worsening SOB and is found to be tachypneic with saturation 88% on rm air and multifocal PNA on CTA chest  - Blood cultures were collected in ED and she was started on Rocephin and azithromycin  - She meets sepsis criteria on admission with tachycardia, tachypnea, and leukocytosis but has not been hypotensive and does  not have organ damage or elevated lactate  - Continue Rocephin and azithromycin, check sputum culture, check strep pneumo and legionella antigens, trend procalcitonin, continue supplemental O2 as needed   2. Pericardial effusion  - Patient presents with cough, SOB, and fevers and is found to have moderate pericardial effusion on CTA chest  - She is slightly tachycardic without JVD or pulsus paradoxus  - She reports fevers but this is explained by pneumonia  - She is not uremic, does not have any known cancer  - Stat echo has been ordered, will check TSH   3. Prediabetes  - Patient reports hx of "prediabetes"   - Serum glucose is 214 on admission  - Update A1c, check CBGs, use a low-intensity SSI if needed    4. Iron-deficiency anemia  - Hgb is 9.2 on admission with MCV 76.5  - Patient reports hx of IDA, states that she used to donate blood but had to stop due to anemia but unsure what prior H&H levels have been and no prior labs available  - She has not noticed any bleeding  - Monitor    5. Leg swelling  - Patient reports new onset of bilateral ankle swelling a couple weeks ago, now seems improved  - She is noted to have BNP of 492, denies hx of CHF  - Echo is pending    DVT prophylaxis: Lovenox  Code Status: Full  Family Communication: Daughter updated at bedside  Disposition Plan:  Patient is from: Home  Anticipated d/c is to: Home  Anticipated d/c date is: 11/24/19 Patient currently: Pending stat echo, improvement in respiratory status  Consults called: None  Admission status: Inpatient     Vianne Bulls, MD [facility]  11/21/2019, 9:49 PM

## 2019-11-21 NOTE — ED Provider Notes (Signed)
8:46 PM Patient was just transferred from [facility] while awaiting admission so that she could get an echo during her admission.  I assessed the patient and she is resting comfortably on 2 L nasal cannula.  She does not have any chest pain currently.  I let the hospitalist team know that she is here and I had secretary call to help facilitate her getting her ultrasound.  Anticipate admission as previously planned.   Torie Priebe, Gwenyth Allegra, MD 11/21/19 [phone]

## 2019-11-21 NOTE — ED Notes (Signed)
Pt. Stated that she took tylenol at home and she thinks its starting to wear off. Rechecked temp and 98.2, but noticeably shaking and stated "she is freezing".  Covered with blankets to warm up, moved her SPO2 probe to ear lobe, due to hands being cold to the touch.  Sats show 100% on 2 L, stated "she is breathing good".  Told to use call bell if anything changes, no distress noted at this time.

## 2019-11-21 NOTE — ED Notes (Signed)
BC X 2 OBTAINED PRIOR TO ABX THERAPY

## 2019-11-22 ENCOUNTER — Inpatient Hospital Stay (HOSPITAL_COMMUNITY): Payer: Medicare HMO

## 2019-11-22 DIAGNOSIS — D509 Iron deficiency anemia, unspecified: Secondary | ICD-10-CM | POA: Diagnosis not present

## 2019-11-22 DIAGNOSIS — J189 Pneumonia, unspecified organism: Secondary | ICD-10-CM

## 2019-11-22 DIAGNOSIS — J9601 Acute respiratory failure with hypoxia: Secondary | ICD-10-CM | POA: Diagnosis not present

## 2019-11-22 DIAGNOSIS — I313 Pericardial effusion (noninflammatory): Secondary | ICD-10-CM

## 2019-11-22 DIAGNOSIS — I2510 Atherosclerotic heart disease of native coronary artery without angina pectoris: Secondary | ICD-10-CM

## 2019-11-22 DIAGNOSIS — I2584 Coronary atherosclerosis due to calcified coronary lesion: Secondary | ICD-10-CM

## 2019-11-22 DIAGNOSIS — I50810 Right heart failure, unspecified: Secondary | ICD-10-CM

## 2019-11-22 LAB — CBC
HCT: 28.4 % — ABNORMAL LOW (ref 36.0–46.0)
Hemoglobin: 8.3 g/dL — ABNORMAL LOW (ref 12.0–15.0)
MCH: 22.7 pg — ABNORMAL LOW (ref 26.0–34.0)
MCHC: 29.2 g/dL — ABNORMAL LOW (ref 30.0–36.0)
MCV: 77.8 fL — ABNORMAL LOW (ref 80.0–100.0)
Platelets: 321 10*3/uL (ref 150–400)
RBC: 3.65 MIL/uL — ABNORMAL LOW (ref 3.87–5.11)
RDW: 17.6 % — ABNORMAL HIGH (ref 11.5–15.5)
WBC: 10.8 10*3/uL — ABNORMAL HIGH (ref 4.0–10.5)
nRBC: 0 % (ref 0.0–0.2)

## 2019-11-22 LAB — BASIC METABOLIC PANEL
Anion gap: 10 (ref 5–15)
BUN: 15 mg/dL (ref 8–23)
CO2: 22 mmol/L (ref 22–32)
Calcium: 8.4 mg/dL — ABNORMAL LOW (ref 8.9–10.3)
Chloride: 105 mmol/L (ref 98–111)
Creatinine, Ser: 0.86 mg/dL (ref 0.44–1.00)
GFR, Estimated: >60 mL/min (ref >60)
Glucose, Bld: 123 mg/dL — ABNORMAL HIGH (ref 70–99)
Potassium: 4.3 mmol/L (ref 3.5–5.1)
Sodium: 137 mmol/L (ref 135–145)

## 2019-11-22 LAB — IRON AND TIBC
Iron: 9 ug/dL — ABNORMAL LOW (ref 28–170)
Saturation Ratios: 3 % — ABNORMAL LOW (ref 10.4–31.8)
TIBC: 276 ug/dL (ref 250–450)
UIBC: 267 ug/dL

## 2019-11-22 LAB — EXPECTORATED SPUTUM ASSESSMENT W GRAM STAIN, RFLX TO RESP C

## 2019-11-22 LAB — ECHOCARDIOGRAM COMPLETE
Area-P 1/2: 9.14 cm2
Calc EF: 44.4 %
Height: 64 in
Single Plane A2C EF: 35.5 %
Single Plane A4C EF: 51.7 %
Weight: 2240 oz

## 2019-11-22 LAB — CBG MONITORING, ED
Glucose-Capillary: 97 mg/dL (ref 70–99)
Glucose-Capillary: 145 mg/dL — ABNORMAL HIGH (ref 70–99)

## 2019-11-22 LAB — GLUCOSE, CAPILLARY
Glucose-Capillary: 123 mg/dL — ABNORMAL HIGH (ref 70–99)
Glucose-Capillary: 126 mg/dL — ABNORMAL HIGH (ref 70–99)
Glucose-Capillary: 178 mg/dL — ABNORMAL HIGH (ref 70–99)

## 2019-11-22 LAB — PROCALCITONIN: Procalcitonin: 0.59 ng/mL

## 2019-11-22 LAB — HEMOGLOBIN A1C
Hgb A1c MFr Bld: 6.1 % — ABNORMAL HIGH (ref 4.8–5.6)
Mean Plasma Glucose: 128.37 mg/dL

## 2019-11-22 LAB — TSH: TSH: 6.024 u[IU]/mL — ABNORMAL HIGH (ref 0.350–4.500)

## 2019-11-22 MED ORDER — ENSURE ENLIVE PO LIQD
237.0000 mL | Freq: Two times a day (BID) | ORAL | Status: DC
  Administered 2019-11-22 – 2019-11-25 (×4): 237 mL via ORAL

## 2019-11-22 MED ORDER — INFLUENZA VAC A&B SA ADJ QUAD 0.5 ML IM PRSY
0.5000 mL | PREFILLED_SYRINGE | INTRAMUSCULAR | Status: DC
  Filled 2019-11-22: qty 0.5

## 2019-11-22 NOTE — ED Notes (Signed)
Report given to [employer]

## 2019-11-22 NOTE — Progress Notes (Signed)
  Echocardiogram 2D Echocardiogram has been performed.  Geoffery Lyons Swaim 11/22/2019, 8:22 AM

## 2019-11-22 NOTE — ED Notes (Signed)
[place] would like an update

## 2019-11-22 NOTE — Consult Note (Signed)
Cardiology Consultation:   Patient ID: Jacqueline Harris MRN: [id number]; DOB: 1945-02-09  Admit date: 11/21/2019 Date of Consult: 11/22/2019  Primary Care Provider: Glenda Chroman, MD [facility] Cardiologist:  New patient/ Dr Meda Coffee  History of Present Illness:   Jacqueline Harris  74 y.o. female with a hx of  COVID-19 in September 2021 who is being seen today for the evaluation of cough, fever, shortness of breath, generalized body aches that has been ongoing for the last 2 days at the request of Dr Roosevelt Locks. Yesterday, she noticed a fever of 102.6.  She states that she started developing a cough and felt like she was having some difficulty breathing and felt nauseous.  No hemoptysis.  She was recently diagnosed with COVID-19 in September 2021.  She states she was prescribed medications by her doctor.  She has not gotten vaccinated denies any history of monoclonal antibody infusions.   Chest x-ray shows enlargement of cardiac silhouette with slight vascular congestion.  Bibasilar infiltrate slightly greater on the left.  Could represent asymmetric pulmonary edema versus pneumonia. CBC shows leukocytosis of 13.6. Hgb is 9.52.  CMP shows glucose of 214.  She was 90% sitting there on the bed.  As she ambulated around, she dropped down to about 87-88.  As a place her back on the bed, she is maintaining O2 sats 88%. Desaturation with talking, started on O2 via [place].  CTA shows no evidence of PE.  She does have moderate pericardial effusion with mild cardiomegaly.  She also has multifocal patchy airspace opacities predominantly within both lung bases suspicious for infectious etiology.    Lactic acid is reassuringly normal.  BNP is elevated 492.  Blood cultures were collected in the ED, patient was treated with acetaminophen, Rocephin, and azithromycin, and a stat echocardiogram was ordered but not yet performed.  Past Medical History:  Diagnosis Date  . Prediabetes    Past Surgical History:  Procedure  Laterality Date  . CHOLECYSTECTOMY    . COLONOSCOPY N/A 12/03/2014   Procedure: COLONOSCOPY;  Surgeon: Rogene Houston, MD;  Location: [facility];  Service: Endoscopy;  Laterality: N/A;  830    Inpatient Medications: Scheduled Meds: . enoxaparin (LOVENOX) injection  40 mg Subcutaneous Q24H  . insulin aspart  0-5 Units Subcutaneous QHS  . insulin aspart  0-9 Units Subcutaneous TID WC  . sodium chloride flush  3 mL Intravenous Q12H  . sodium chloride flush  3 mL Intravenous Q12H   Continuous Infusions: . sodium chloride 50 mL/hr at 11/21/19 1730  . sodium chloride    . azithromycin    . cefTRIAXone (ROCEPHIN)  IV     PRN Meds: sodium chloride, sodium chloride, acetaminophen **OR** acetaminophen, HYDROcodone-acetaminophen, ondansetron **OR** ondansetron (ZOFRAN) IV, polyethylene glycol, sodium chloride flush  Allergies:   No Known Allergies  Social History:   Social History   Socioeconomic History  . Marital status: Divorced    Spouse name: Not on file  . Number of children: Not on file  . Years of education: Not on file  . Highest education level: Not on file  Occupational History  . Not on file  Tobacco Use  . Smoking status: Former Research scientist (life sciences)  . Smokeless tobacco: Never Used  Vaping Use  . Vaping Use: Never used  Substance and Sexual Activity  . Alcohol use: No  . Drug use: No  . Sexual activity: Not on file  Other Topics Concern  . Not on file  Social History Narrative  .  Not on file   Social Determinants of Health   Financial Resource Strain:   . Difficulty of Paying Living Expenses: Not on file  Food Insecurity:   . Worried About Charity fundraiser in the Last Year: Not on file  . Ran Out of Food in the Last Year: Not on file  Transportation Needs:   . Lack of Transportation (Medical): Not on file  . Lack of Transportation (Non-Medical): Not on file  Physical Activity:   . Days of Exercise per Week: Not on file  . Minutes of Exercise per Session: Not on  file  Stress:   . Feeling of Stress : Not on file  Social Connections:   . Frequency of Communication with Friends and Family: Not on file  . Frequency of Social Gatherings with Friends and Family: Not on file  . Attends Religious Services: Not on file  . Active Member of Clubs or Organizations: Not on file  . Attends Archivist Meetings: Not on file  . Marital Status: Not on file  Intimate Partner Violence:   . Fear of Current or Ex-Partner: Not on file  . Emotionally Abused: Not on file  . Physically Abused: Not on file  . Sexually Abused: Not on file    Family History:    Family History  Problem Relation Age of Onset  . Cancer - Lung Mother   . Cancer Father   . Cancer - Colon Brother   . Diabetes Sister   . Hypertension Sister      ROS:  Please see the history of present illness.  All other ROS reviewed and negative.     Physical Exam/Data:   Vitals:   11/22/19 1145 11/22/19 1200 11/22/19 1215 11/22/19 1215  BP: (!) 132/58 (!) 129/52 (!) 121/58   Pulse:  (!) 101    Resp: (!) 31 (!) 27 18   Temp:      TempSrc:      SpO2:  97%    Weight:    63.5 kg  Height:    _0  (1.626 m)    Intake/Output Summary (Last 24 hours) at 11/22/2019 1305 Last data filed at 11/21/2019 1806 Gross per 24 hour  Intake 100 ml  Output --  Net 100 ml   Last 3 Weights 11/22/2019 11/21/2019 12/03/2014  Weight (lbs) 140 lb 140 lb 155 lb  Weight (kg) 63.504 kg 63.504 kg 70.308 kg     Body mass index is 24.03 kg/m.  General:  Well nourished, well developed, in no acute distress, laying in bed HEENT: normal Lymph: no adenopathy Neck: no JVD Endocrine:  No thryomegaly Vascular: No carotid bruits; FA pulses 2+ bilaterally without bruits  Cardiac:  normal S1, S2; RRR; no murmur, no rub Lungs:  Rales bilaterally, no wheezing, rhonchi or rales  Abd: soft, nontender, no hepatomegaly  Ext: minimal LLE edema Musculoskeletal:  No deformities, BUE and BLE strength normal and  equal Skin: warm and dry  Neuro:  CNs 2-12 intact, no focal abnormalities noted Psych:  Normal affect   EKG:  The EKG was personally reviewed and demonstrates:  ST, LAFB Telemetry:  Telemetry was personally reviewed and demonstrates:  SR to ST  Laboratory Data:  High Sensitivity Troponin:  No results for input(s): TROPONINIHS in the last 720 hours.   Chemistry Recent Labs  Lab 11/21/19 1445 11/22/19 0507  NA 132* 137  K 4.2 4.3  CL 101 105  CO2 22 22  GLUCOSE 214* 123*  BUN 15 15  CREATININE 0.86 0.86  CALCIUM 8.2* 8.4*  GFRNONAA >60 >60  ANIONGAP 9 10    Recent Labs  Lab 11/21/19 1445  PROT 7.3  ALBUMIN 2.8*  AST 25  ALT 11  ALKPHOS 58  BILITOT 0.7   Hematology Recent Labs  Lab 11/21/19 1445 11/22/19 0507  WBC 13.6* 10.8*  RBC 4.04 3.65*  HGB 9.2* 8.3*  HCT 30.9* 28.4*  MCV 76.5* 77.8*  MCH 22.8* 22.7*  MCHC 29.8* 29.2*  RDW 17.9* 17.6*  PLT 364 321   BNP Recent Labs  Lab 11/21/19 1458  BNP 492.4*    DDimer No results for input(s): DDIMER in the last 168 hours.    Radiology    CT Angio Chest PE W and/or Wo Contrast  Result Date: 11/21/2019 CLINICAL DATA:  Shortness of breath . IMPRESSION: No central, segmental, or subsegmental pulmonary embolism. Moderate pericardial effusion with mild cardiomegaly. No evidence of right ventricular heart strain. Multifocal patchy airspace opacities, predominantly within both lung bases which may be due to infectious etiology. Trace left pleural effusion. Aortic Atherosclerosis (ICD10-I70.0). Electronically Signed   By: Prudencio Pair M.D.   On: 11/21/2019 17:10   DG Chest Portable 1 View  Result Date: 11/21/2019 CLINICAL DATA:  Cough, fever, shortness of breath, COVID-19 on 10/11/2019  IMPRESSION: Enlargement of cardiac silhouette with slight vascular congestion. Bibasilar infiltrates slightly greater on LEFT, could represent asymmetric pulmonary edema or pneumonia. Electronically Signed   By: Lavonia Dana M.D.    On: 11/21/2019 13:58    Cardiac Studies   ECHOCARDIOGRAM COMPLETE 11/22/2019  1. Left ventricular ejection fraction, by estimation, is 50 to 55%. The  left ventricle has low normal function. The left ventricle demonstrates  global hypokinesis. Left ventricular diastolic parameters are consistent  with Grade I diastolic dysfunction  (impaired relaxation). There is the interventricular septum is flattened  in systole and diastole, consistent with right ventricular pressure and  volume overload.  2. Right ventricular systolic function is moderately reduced. The right  ventricular size is moderately enlarged. There is severely elevated  pulmonary artery systolic pressure. The estimated right ventricular  systolic pressure is 90.2 mmHg.  3. A small pericardial effusion is present. The pericardial effusion is  posterior and lateral to the left ventricle. There is no evidence of  cardiac tamponade.  4. The mitral valve is normal in structure. No evidence of mitral valve  regurgitation. No evidence of mitral stenosis.  5. Tricuspid valve regurgitation is moderate.    Assessment and Plan:   1. Pericardial effusion  1. I have personally reviewed her images, there is only small amount of pericardial effusion without any evidence for a tamponade  2. Acute respiratory failure with hypoxia  1. Secondary to CAP,  treated with ATB by primary team, also supplemental O2 for hypoxia  3. RV dilatation and dysfunction  1. Most probably sec to pulmonary etiology, it should be reevaluated once pneumonia is resolved 2. No pulmonary embolism on chest CTA  4. Coronary calcifications and aortic atherosclerosis I have personally reviewed her chest CTA, she will need to be started on a statin at discharge, I will check her lipids, I would be cautious with ASA as she has microcytic anemia that needs to be evaluated  5. Anemia   - microcytic  - I will send iron studies  6. LLE edema - after a long  travel to [place]  - no PE on chest CTA  - I will obtain B/L LE  venous Duplex  For questions or updates, please contact [place] Please consult [email or web address] for contact info under   Signed, Ena Dawley, MD  11/22/2019 1:05 PM

## 2019-11-22 NOTE — Progress Notes (Signed)
PROGRESS NOTE    Jacqueline Harris  CBS:[id number] DOB: 1945/10/22 DOA: 11/21/2019 PCP: Glenda Chroman, MD   Chief Complaint  Patient presents with  . Cough   Brief Narrative: 74 year old female with history of prediabetes, iron deficiency anemia, COVID-19 infection in September 2021 presented with cough shortness of breath fever for past 3 days.  She did not require hospitalization for her recent Covid infection. As per report in the ED hypoxic 88% room air tachypneic, tachycardic with stable blood pressure EKG sinus tachycardia , CXR-demonstrates enlarged cardiac silhouette with slight vascular congestion and basilar infiltrates.  CTA chest is negative for PE but notable for moderate pericardial effusion and patchy airspace opacities concerning for multifocal pneumonia.  Labs with leukocytosis, microcytic anemia, slightly positive BNP 492.Blood cultures were collected in the ED, patient was treated with acetaminophen, Rocephin, and azithromycin, and a stat echocardiogram was ordered but not yet performed  Subjective:  Afebrile overnight, blood culture 2.5, WBC 10.8 anemic with hemoglobin 8.3 g tsh 6.0, hba1c 6.1 Seen this morning.  Patient complains of pain across the mid back with deep inspiration. Some shortness of breath, remains on nasal cannula oxygen, intermittent tachycardic remains tachypneic  Assessment & Plan:  Sepsis present on admission due to multifocal pneumonia: Blood cultures pending, continue ceftriaxone azithromycin, currently hemodynamically stable although appears tachypneic, continue oxygen.  Lactic acid reassuring, procalcitonin 0.5, follow-up sputum culture strep pneumonia and Legionella antigen continue respiratory support  Acute respiratory failure with hypoxia due to above, no PE on CTA.  Continue supplemental oxygen to maintain saturation 92 to 96%.  Incentive spirometry, tolerating  Recent COVID-19 infection did not require hospitalization, COVID-19 negative on  admission.    Moderate Pericardial effusion by CT scan, echo has been ordered,will follow up the results.  Hemodynamically stable with blood pressure in 110.Echo report reviewed: EF 55 to 84% grade 1 diastolic dysfunction, flattened interventricular septum consistent with right ventricular pressure and volume overload with severely elevated pulmonary artery pressure,there was a small pericardial effusion.  Requested cardiology consultation and discussed w/ Dr Gasper Sells.  Bilateral lower leg edema in the ankle for a few weeks.BNP of 492 no history of CHF, echo showed grade 1 diastolic dysfunction with severely elevated pulmonary hypertension.Leg edema better. Som asymmetric swelling per daughter-checking duplex to or/o DVT.  Chronic Iron deficiency anemia: Hemoglobin 8 to 9 g.  Monitor check anemia panel Recent Labs  Lab 11/21/19 1445 11/22/19 0507  HGB 9.2* 8.3*  HCT 30.9* 28.4*   Prediabetes stable hemoglobin A1c 6.1.  Abnormal thyroid function tsh 6.1-check free T4  Nutrition: Diet Order            Diet NPO time specified Except for: Sips with Meds, Ice Chips  Diet effective midnight                  Body mass index is 24.03 kg/m.  DVT prophylaxis: enoxaparin (LOVENOX) injection 40 mg Start: 11/21/19 2145 Code Status:   Code Status: Full Code  Family Communication: plan of care discussed with patient at bedside.called Maudie Mercury and updated.  Status is: Inpatient Remains inpatient appropriate because:Ongoing diagnostic testing needed not appropriate for outpatient work up, IV treatments appropriate due to intensity of illness or inability to take PO and Inpatient level of care appropriate due to severity of illness  Dispo: The patient is from: Home              Anticipated d/c is to: Home  Anticipated d/c date is:2- 3 days              Patient currently is not medically stable to d/c.   Consultants:see note  Procedures:see note  1.Left ventricular ejection  fraction, by estimation, is 50 to 55%. The  left ventricle has low normal function. The left ventricle demonstrates  global hypokinesis. Left ventricular diastolic parameters are consistent  with Grade I diastolic dysfunction  (impaired relaxation).There is the interventricular septum is flattened  in systole and diastole, consistent with right ventricular pressure and  volume overload.  2.Right ventricular systolic function is moderately reduced. The right  ventricular size is moderately enlarged.There is severely elevated  pulmonary artery systolic pressure.The estimated right ventricular  systolic pressure is 03.5 mmHg.  3. A small pericardial effusion is present.The pericardial effusion is  posterior and lateral to the left ventricle. There is no evidence of  cardiac tamponade.  4. The mitral valve is normal in structure.No evidence of mitral valve  regurgitation. No evidence of mitral stenosis.  5. Tricuspid valve regurgitation is moderate.  6. The aortic valve is normal in structure.Aortic valve regurgitation is  not visualized. Mild to moderate aortic valve sclerosis/calcification is  present, without any evidence of aortic stenosis.  7. The inferior vena cava is normal in size with greater than 50%  respiratory variability, suggesting right atrial pressure of 3 mmHg.  Culture/Microbiology    Component Value Date/Time   SDES  11/21/2019 1725    BLOOD RIGHT HAND Performed at [facility], [address]hat [place], [place] [address]    SDES  11/21/2019 1725    BLOOD LEFT FOREARM Performed at [facility], [address] [place] Bhat [place], [place] [address]    [facility]  11/21/2019 1725    BOTTLES DRAWN AEROBIC AND ANAEROBIC Blood Culture adequate volume Performed at [facility], [address] [place], [place] [address]    [facility]  11/21/2019 1725    BOTTLES DRAWN AEROBIC AND ANAEROBIC Blood Culture adequate volume Performed at [facility], [address] [place], [place] [address]    CULT  11/21/2019 1725    NO GROWTH < 12 HOURS Performed at [facility], [place] [address] [place], [place] [address]    CULT  11/21/2019 1725    NO GROWTH < 12 HOURS Performed at [facility] [address] [place],  [address]    REPTSTATUS PENDING 11/21/2019 1725   REPTSTATUS PENDING 11/21/2019 1725    Other culture-see note  Medications: Scheduled Meds: . enoxaparin (LOVENOX) injection  40 mg Subcutaneous Q24H  . insulin aspart  0-5 Units Subcutaneous QHS  . insulin aspart  0-9 Units Subcutaneous TID WC  . sodium chloride flush  3 mL Intravenous Q12H  . sodium chloride flush  3 mL Intravenous Q12H   Continuous Infusions: . sodium chloride 50 mL/hr at 11/21/19 1730  . sodium chloride    . azithromycin    . cefTRIAXone (ROCEPHIN)  IV      Antimicrobials: Anti-infectives (From admission, onward)   Start     Dose/Rate Route Frequency Ordered Stop   11/22/19 1730  cefTRIAXone (ROCEPHIN) 2 g in sodium chloride 0.9 % 100 mL IVPB        2 g 200 mL/hr over 30 Minutes Intravenous Every 24 hours 11/21/19 2134 11/26/19 1729   11/22/19 1730  azithromycin (ZITHROMAX) 500 mg in sodium chloride 0.9 % 250 mL IVPB  500 mg 250 mL/hr over 60 Minutes Intravenous Every 24 hours 11/21/19 2134 11/26/19 1729   11/21/19 1645  cefTRIAXone (ROCEPHIN) 1 g in sodium chloride 0.9 % 100 mL IVPB        1 g 200 mL/hr over 30 Minutes Intravenous  Once 11/21/19 1631 11/21/19 1806   11/21/19 1645  azithromycin (ZITHROMAX) 500 mg in sodium chloride 0.9 % 250 mL IVPB        500 mg 250 mL/hr over 60 Minutes Intravenous  Once 11/21/19 1631 11/22/19 0159     Objective: Vitals: Today's Vitals   11/21/19 2345 11/22/19 0000 11/22/19 0015 11/22/19 0030  BP: (!) 116/49 (!) 112/97 109/61 (!) 114/55  Pulse: 95 99 98 100  Resp: (!) 33 (!) 35 (!) 21 (!) 38  Temp:      TempSrc:      SpO2: 99% 98% 98% 98%  Weight:        Height:      PainSc:        Intake/Output Summary (Last 24 hours) at 11/22/2019 0838 Last data filed at 11/21/2019 1806 Gross per 24 hour  Intake 100 ml  Output --  Net 100 ml   Filed Weights   11/21/19 1330  Weight: 63.5 kg   Weight change:   Intake/Output from previous day: 11/05 0701 - 11/06 0700 In: 100 [IV Piggyback:100] Out: -  Intake/Output this shift: No intake/output data recorded.  Examination: General exam:AAOx3,anxious,NAD,weak appearing. HEENT:Oral mucosa moist, Ear/Nose WNL grossly,dentition normal. Respiratory system: bilaterally basal crackles, conducted breath sounds, tachypneic Cardiovascular system: S1 & S2 +,regular,No JVD. Gastrointestinal system:Abdomen soft,NT,ND,BS+. Nervous System:Alert, awake,moving extremities and grossly nonfocal Extremities: No edema, distal peripheral pulses palpable.  Skin: No rashes,no icterus. MSK: Normal muscle bulk,tone, power  Data Reviewed: I have personally reviewed following labs and imaging studies CBC: Recent Labs  Lab 11/21/19 1445 11/22/19 0507  WBC 13.6* 10.8*  NEUTROABS 10.7*  --   HGB 9.2* 8.3*  HCT 30.9* 28.4*  MCV 76.5* 77.8*  PLT 364 502   Basic Metabolic Panel: Recent Labs  Lab 11/21/19 1445 11/22/19 0507  NA 132* 137  K 4.2 4.3  CL 101 105  CO2 22 22  GLUCOSE 214* 123*  BUN 15 15  CREATININE 0.86 0.86  CALCIUM 8.2* 8.4*   GFR: Estimated Creatinine Clearance: 51.1 mL/min (by C-G formula based on SCr of 0.86 mg/dL). Liver Function Tests: Recent Labs  Lab 11/21/19 1445  AST 25  ALT 11  ALKPHOS 58  BILITOT 0.7  PROT 7.3  ALBUMIN 2.8*   No results for input(s): LIPASE, AMYLASE in the last 168 hours. No results for input(s): AMMONIA in the last 168 hours. Coagulation Profile: No results for input(s): INR, PROTIME in the last 168 hours. Cardiac Enzymes: No results for input(s): CKTOTAL, CKMB, CKMBINDEX, TROPONINI in the last 168 hours. BNP (last 3 results) No results for  input(s): PROBNP in the last 8760 hours. HbA1C: Recent Labs    11/22/19 0507  HGBA1C 6.1*   CBG: Recent Labs  Lab 11/21/19 2254 11/22/19 0823  GLUCAP 166* 97   Lipid Profile: No results for input(s): CHOL, HDL, LDLCALC, TRIG, CHOLHDL, LDLDIRECT in the last 72 hours. Thyroid Function Tests: Recent Labs    11/22/19 0507  TSH 6.024*   Anemia Panel: No results for input(s): VITAMINB12, FOLATE, FERRITIN, TIBC, IRON, RETICCTPCT in the last 72 hours. Sepsis Labs: Recent Labs  Lab 11/21/19 1723 11/22/19 0507  PROCALCITON  --  0.59  LATICACIDVEN 1.5  --  Recent Results (from the past 240 hour(s))  Respiratory Panel by RT PCR (Flu A&B, Covid) - Nasopharyngeal Swab     Status: None   Collection Time: 11/21/19  2:52 PM   Specimen: Nasopharyngeal Swab  Result Value Ref Range Status   SARS Coronavirus 2 by RT PCR NEGATIVE NEGATIVE Final    Comment: (NOTE) SARS-CoV-2 target nucleic acids are NOT DETECTED.  The SARS-CoV-2 RNA is generally detectable in upper respiratoy specimens during the acute phase of infection. The lowest concentration of SARS-CoV-2 viral copies this assay can detect is 131 copies/mL. A negative result does not preclude SARS-Cov-2 infection and should not be used as the sole basis for treatment or other patient management decisions. A negative result may occur with  improper specimen collection/handling, submission of specimen other than nasopharyngeal swab, presence of viral mutation(s) within the areas targeted by this assay, and inadequate number of viral copies (<131 copies/mL). A negative result must be combined with clinical observations, patient history, and epidemiological information. The expected result is Negative.  Fact Sheet for Patients:  PinkCheek.be  Fact Sheet for Healthcare Providers:  GravelBags.it  This test is no t yet approved or cleared by the [place] FDA and    has been authorized for detection and/or diagnosis of SARS-CoV-2 by FDA under an Emergency Use Authorization (EUA). This EUA will remain  in effect (meaning this test can be used) for the duration of the COVID-19 declaration under Section 564(b)(1) of the Act, 21 U.S.C. section 360bbb-3(b)(1), unless the authorization is terminated or revoked sooner.     Influenza A by PCR NEGATIVE NEGATIVE Final   Influenza B by PCR NEGATIVE NEGATIVE Final    Comment: (NOTE) The Xpert Xpress SARS-CoV-2/FLU/RSV assay is intended as an aid in  the diagnosis of influenza from Nasopharyngeal swab specimens and  should not be used as a sole basis for treatment. Nasal washings and  aspirates are unacceptable for Xpert Xpress SARS-CoV-2/FLU/RSV  testing.  Fact Sheet for Patients: PinkCheek.be  Fact Sheet for Healthcare Providers: GravelBags.it  This test is not yet approved or cleared by the [place] FDA and  has been authorized for detection and/or diagnosis of SARS-CoV-2 by  FDA under an Emergency Use Authorization (EUA). This EUA will remain  in effect (meaning this test can be used) for the duration of the  Covid-19 declaration under Section 564(b)(1) of the Act, 21  U.S.C. section 360bbb-3(b)(1), unless the authorization is  terminated or revoked. Performed at [facility], [address] [place], [place] [address]   Blood culture (routine x 2)     Status: None (Preliminary result)   Collection Time: 11/21/19  5:25 PM   Specimen: BLOOD RIGHT HAND  Result Value Ref Range Status   Specimen Description   Final    BLOOD RIGHT HAND Performed at [facility], [address] [place], [place] [address]    Special Requests   Final    BOTTLES DRAWN AEROBIC AND ANAEROBIC Blood Culture adequate volume Performed at [facility], [address] [place], [place] [address]    Culture   Final    NO GROWTH <  12 HOURS Performed at [facility], [place] [address] [place], [place] [address]    Report Status PENDING  Incomplete  Blood culture (routine x 2)     Status: None (Preliminary result)   Collection Time: 11/21/19  5:25 PM   Specimen: BLOOD LEFT FOREARM  Result  Value Ref Range Status   Specimen Description   Final    BLOOD LEFT FOREARM Performed at [facility], [address] [place], [place] [address]    Special Requests   Final    BOTTLES DRAWN AEROBIC AND ANAEROBIC Blood Culture adequate volume Performed at [facility], [address] [place], [place] [address]    Culture   Final    NO GROWTH < 12 HOURS Performed at [facility], [place] [address] [place], [place] [address]    Report Status PENDING  Incomplete     Radiology Studies: CT Angio Chest PE W and/or Wo Contrast  Result Date: 11/21/2019 CLINICAL DATA:  Shortness of breath EXAM: CT ANGIOGRAPHY CHEST WITH CONTRAST TECHNIQUE: Multidetector CT imaging of the chest was performed using the standard protocol during bolus administration of intravenous contrast. Multiplanar CT image reconstructions and MIPs were obtained to evaluate the vascular anatomy. CONTRAST:  68m OMNIPAQUE IOHEXOL 350 MG/ML SOLN COMPARISON:  None. FINDINGS: Cardiovascular: There is a optimal opacification of the pulmonary arteries. There is no central,segmental, or subsegmental filling defects within the pulmonary arteries. There is mild cardiomegaly. A moderate pericardial effusion is seen. No evidence right heart strain. There is normal three-vessel brachiocephalic anatomy without proximal stenosis. Scattered aortic atherosclerosis is seen. There is coronary artery calcifications present. Mediastinum/Nodes: No hilar, mediastinal, or axillary adenopathy. Thyroid gland, trachea, and esophagus demonstrate no significant findings. Lungs/Pleura: Patchy airspace opacities are seen at the posterior bilateral lung bases. There is also a  small amount of patchy airspace opacity at the posterior left upper lobe. There is a trace left pleural effusion present. Upper Abdomen: No acute abnormalities present in the visualized portions of the upper abdomen. Musculoskeletal: No chest wall abnormality. No acute or significant osseous findings. Review of the MIP images confirms the above findings. IMPRESSION: No central, segmental, or subsegmental pulmonary embolism. Moderate pericardial effusion with mild cardiomegaly. No evidence of right ventricular heart strain. Multifocal patchy airspace opacities, predominantly within both lung bases which may be due to infectious etiology. Trace left pleural effusion. Aortic Atherosclerosis (ICD10-I70.0). Electronically Signed   By: BPrudencio PairM.D.   On: 11/21/2019 17:10   DG Chest Portable 1 View  Result Date: 11/21/2019 CLINICAL DATA:  Cough, fever, shortness of breath, COVID-19 on 10/11/2019 EXAM: PORTABLE CHEST 1 VIEW COMPARISON:  Portable exam 1334 hours without priors available for comparison FINDINGS: Enlargement of cardiac silhouette with slight pulmonary vascular congestion. Central pulmonary arterial enlargement at the LEFT hilum. Interstitial infiltrates at the mid to lower lungs greater at LEFT base laterally, could represent asymmetric edema over infection. No pleural effusion or pneumothorax. Bones demineralized. IMPRESSION: Enlargement of cardiac silhouette with slight vascular congestion. Bibasilar infiltrates slightly greater on LEFT, could represent asymmetric pulmonary edema or pneumonia. Electronically Signed   By: MLavonia DanaM.D.   On: 11/21/2019 13:58     LOS: 1 day   RAntonieta Pert MD [facility]  11/22/2019, 8:38 AM

## 2019-11-22 NOTE — Progress Notes (Signed)
Pt complained of left shoulder pain rated it an 8, it only hurst when she moves. MD made aware. Xray ordered.

## 2019-11-23 ENCOUNTER — Inpatient Hospital Stay (HOSPITAL_COMMUNITY): Payer: Medicare HMO

## 2019-11-23 DIAGNOSIS — J189 Pneumonia, unspecified organism: Secondary | ICD-10-CM

## 2019-11-23 DIAGNOSIS — I82402 Acute embolism and thrombosis of unspecified deep veins of left lower extremity: Secondary | ICD-10-CM

## 2019-11-23 DIAGNOSIS — M79609 Pain in unspecified limb: Secondary | ICD-10-CM

## 2019-11-23 DIAGNOSIS — M7989 Other specified soft tissue disorders: Secondary | ICD-10-CM

## 2019-11-23 LAB — LIPID PANEL
Cholesterol: 123 mg/dL (ref 0–200)
HDL: 14 mg/dL — ABNORMAL LOW (ref >40)
LDL Cholesterol: 90 mg/dL (ref 0–99)
Total CHOL/HDL Ratio: 8.8 RATIO
Triglycerides: 97 mg/dL (ref <150)
VLDL: 19 mg/dL (ref 0–40)

## 2019-11-23 LAB — BASIC METABOLIC PANEL
Anion gap: 8 (ref 5–15)
BUN: 16 mg/dL (ref 8–23)
CO2: 23 mmol/L (ref 22–32)
Calcium: 8.3 mg/dL — ABNORMAL LOW (ref 8.9–10.3)
Chloride: 105 mmol/L (ref 98–111)
Creatinine, Ser: 0.85 mg/dL (ref 0.44–1.00)
GFR, Estimated: >60 mL/min (ref >60)
Glucose, Bld: 161 mg/dL — ABNORMAL HIGH (ref 70–99)
Potassium: 4.3 mmol/L (ref 3.5–5.1)
Sodium: 136 mmol/L (ref 135–145)

## 2019-11-23 LAB — CBC
HCT: 27.1 % — ABNORMAL LOW (ref 36.0–46.0)
Hemoglobin: 8.3 g/dL — ABNORMAL LOW (ref 12.0–15.0)
MCH: 23.2 pg — ABNORMAL LOW (ref 26.0–34.0)
MCHC: 30.6 g/dL (ref 30.0–36.0)
MCV: 75.7 fL — ABNORMAL LOW (ref 80.0–100.0)
Platelets: 335 10*3/uL (ref 150–400)
RBC: 3.58 MIL/uL — ABNORMAL LOW (ref 3.87–5.11)
RDW: 17.6 % — ABNORMAL HIGH (ref 11.5–15.5)
WBC: 9.2 10*3/uL (ref 4.0–10.5)
nRBC: 0 % (ref 0.0–0.2)

## 2019-11-23 LAB — FOLATE: Folate: 11.8 ng/mL (ref >5.9)

## 2019-11-23 LAB — RETICULOCYTES
Immature Retic Fract: 18 % — ABNORMAL HIGH (ref 2.3–15.9)
RBC.: 3.64 MIL/uL — ABNORMAL LOW (ref 3.87–5.11)
Retic Count, Absolute: 57.1 10*3/uL (ref 19.0–186.0)
Retic Ct Pct: 1.6 % (ref 0.4–3.1)

## 2019-11-23 LAB — PROCALCITONIN: Procalcitonin: 0.43 ng/mL

## 2019-11-23 LAB — IRON AND TIBC
Iron: 10 ug/dL — ABNORMAL LOW (ref 28–170)
Saturation Ratios: 4 % — ABNORMAL LOW (ref 10.4–31.8)
TIBC: 256 ug/dL (ref 250–450)
UIBC: 246 ug/dL

## 2019-11-23 LAB — GLUCOSE, CAPILLARY
Glucose-Capillary: 126 mg/dL — ABNORMAL HIGH (ref 70–99)
Glucose-Capillary: 188 mg/dL — ABNORMAL HIGH (ref 70–99)
Glucose-Capillary: 121 mg/dL — ABNORMAL HIGH (ref 70–99)
Glucose-Capillary: 173 mg/dL — ABNORMAL HIGH (ref 70–99)

## 2019-11-23 LAB — FERRITIN: Ferritin: 43 ng/mL (ref 11–307)

## 2019-11-23 LAB — T4, FREE: Free T4: 0.97 ng/dL (ref 0.61–1.12)

## 2019-11-23 LAB — VITAMIN B12: Vitamin B-12: 940 pg/mL — ABNORMAL HIGH (ref 180–914)

## 2019-11-23 MED ORDER — ENOXAPARIN SODIUM 80 MG/0.8ML ~~LOC~~ SOLN
70.0000 mg | Freq: Two times a day (BID) | SUBCUTANEOUS | Status: DC
  Administered 2019-11-23 (×2): 70 mg via SUBCUTANEOUS
  Filled 2019-11-23 (×2): qty 0.8

## 2019-11-23 NOTE — Plan of Care (Signed)
Marland Kitchen

## 2019-11-23 NOTE — Progress Notes (Signed)
VASCULAR LAB    Bilateral lower extremity venous duplex has been performed.  See CV proc for preliminary results.  Messaged Dr. Lupita Leash and Dr. Meda Coffee with results.   Mance Vallejo, RVT 11/23/2019, 11:33 AM

## 2019-11-23 NOTE — Progress Notes (Signed)
PROGRESS NOTE    Jacqueline Harris  JJK:[id number] DOB: 08-12-1945 DOA: 11/21/2019 PCP: Glenda Chroman, MD   Chief Complaint  Patient presents with  . Cough   Brief Narrative: 74 year old female with history of prediabetes, iron deficiency anemia, COVID-19 infection in September 2021 presented with cough shortness of breath fever for past 3 days.  She did not require hospitalization for her recent Covid infection. As per report in the ED hypoxic 88% room air tachypneic, tachycardic with stable blood pressure EKG sinus tachycardia , CXR-demonstrates enlarged cardiac silhouette with slight vascular congestion and basilar infiltrates.  CTA chest is negative for PE but notable for moderate pericardial effusion and patchy airspace opacities concerning for multifocal pneumonia.  Labs with leukocytosis, microcytic anemia, slightly positive BNP 492.Blood cultures were collected in the ED, patient was treated with acetaminophen, Rocephin, and azithromycin, and a stat echocardiogram was ordered but not yet performed  Subjective:  Overnight blood pressure 99-131, T-max 100.2 on 1 L nasal cannula. Leukocytosis is resolved. Patient feels much better today. Left leg swelling has improved. Patient daughter is at the bedside.  Assessment & Plan:  Sepsis POA due to multifocal pneumonia:Low-grade fever, hemodynamically stable.  Blood cultures pending, continue ceftriaxone azithromycin.Has normal lactic acid and procalcitonin 0.5, follow-up sputum culture strep pneumonia and Legionella antigen . Increase activity  Acute respiratory failure with hypoxia due to pneumonia, no PE on CTA.  Echo shows RV dilation and dysfunction, continue supplemental oxygen to keep saturation 92 to 96%, increase activity   Recent COVID-19 infection did not require hospitalization, COVID-19 negative on admission.    Moderate Pericardial effusion by CT scan, echo showed very small pleural pericardial effusion and cardiology input  appreciated  Right ventricular dilatation and dysfunction with severely elevated pulmonary artery pressure,there was a small pericardial effusion, CTA with no PE, ? Etiology-?pulmonary etiology/recent Covid infection, I wonder if she had recent PE given left leg acute DVT/left leg edema and edema has improved and it may be possible PE has cleared and not detected on current CTA. CT will need  outpatient follow-up, defer to cardiology.  Left leg edema echo shows acute DVT starting on Lovenox and discussed with cardiology Plan to convert to [place] soon  Chronic Iron deficiency anemia: Hemoglobin 8 to 9 g. F/u anemia panel Recent Labs  Lab 11/21/19 1445 11/22/19 0507  HGB 9.2* 8.3*  HCT 30.9* 28.4*   Prediabetes stable hemoglobin A1c 6.1.  Abnormal thyroid function tsh 6.1-check free T4  Nutrition: Diet Order            DIET SOFT Room service appropriate? Yes; Fluid consistency: Thin  Diet effective now                 Body mass index is 25.24 kg/m.  DVT prophylaxis: enoxaparin (LOVENOX) injection 40 mg Start: 11/21/19 2145 Code Status:   Code Status: Full Code  Family Communication: plan of care discussed with patient at bedside.called Maudie Mercury and updated. Spoke with patient's daughter Maudie Mercury at the bedside today.  Status is: Inpatient Remains inpatient appropriate because:Ongoing diagnostic testing needed not appropriate for outpatient work up, IV treatments appropriate due to intensity of illness or inability to take PO and Inpatient level of care appropriate due to severity of illness  Dispo: The patient is from: Home              Anticipated d/c is to: Home, obtain PT OT evaluation after anticoagulation  Anticipated d/c date is:2 days              Patient currently is not medically stable to d/c.   Consultants:see note  Procedures:see note  1.Left ventricular ejection fraction, by estimation, is 50 to 55%. The  left ventricle has low normal function. The left  ventricle demonstrates  global hypokinesis. Left ventricular diastolic parameters are consistent  with Grade I diastolic dysfunction  (impaired relaxation).There is the interventricular septum is flattened  in systole and diastole, consistent with right ventricular pressure and  volume overload.  2.Right ventricular systolic function is moderately reduced. The right  ventricular size is moderately enlarged.There is severely elevated  pulmonary artery systolic pressure.The estimated right ventricular  systolic pressure is 45.9 mmHg.  3. A small pericardial effusion is present.The pericardial effusion is  posterior and lateral to the left ventricle. There is no evidence of  cardiac tamponade.  4. The mitral valve is normal in structure.No evidence of mitral valve  regurgitation. No evidence of mitral stenosis.  5. Tricuspid valve regurgitation is moderate.  6. The aortic valve is normal in structure.Aortic valve regurgitation is  not visualized. Mild to moderate aortic valve sclerosis/calcification is  present, without any evidence of aortic stenosis.  7. The inferior vena cava is normal in size with greater than 50%  respiratory variability, suggesting right atrial pressure of 3 mmHg.  Culture/Microbiology    Component Value Date/Time   SDES Expect. Sput 11/21/2019 2132   SDES Expect. Sput 11/21/2019 2132   SPECREQUEST NONE 11/21/2019 2132   SPECREQUEST NONE Reflexed from [id number] 11/21/2019 2132   CULT PENDING 11/21/2019 2132   REPTSTATUS 11/22/2019 FINAL 11/21/2019 2132   REPTSTATUS PENDING 11/21/2019 2132    Other culture-see note  Medications: Scheduled Meds: . enoxaparin (LOVENOX) injection  40 mg Subcutaneous Q24H  . feeding supplement  237 mL Oral BID BM  . influenza vaccine adjuvanted  0.5 mL Intramuscular Tomorrow-1000  . insulin aspart  0-5 Units Subcutaneous QHS  . insulin aspart  0-9 Units Subcutaneous TID WC  . sodium chloride flush  3 mL Intravenous Q12H  .  sodium chloride flush  3 mL Intravenous Q12H   Continuous Infusions: . sodium chloride 50 mL/hr at 11/21/19 1730  . sodium chloride 250 mL (11/22/19 1738)  . azithromycin 500 mg (11/22/19 1740)  . cefTRIAXone (ROCEPHIN)  IV 2 g (11/22/19 1707)    Antimicrobials: Anti-infectives (From admission, onward)   Start     Dose/Rate Route Frequency Ordered Stop   11/22/19 1730  cefTRIAXone (ROCEPHIN) 2 g in sodium chloride 0.9 % 100 mL IVPB        2 g 200 mL/hr over 30 Minutes Intravenous Every 24 hours 11/21/19 2134 11/26/19 1729   11/22/19 1730  azithromycin (ZITHROMAX) 500 mg in sodium chloride 0.9 % 250 mL IVPB        500 mg 250 mL/hr over 60 Minutes Intravenous Every 24 hours 11/21/19 2134 11/26/19 1729   11/21/19 1645  cefTRIAXone (ROCEPHIN) 1 g in sodium chloride 0.9 % 100 mL IVPB        1 g 200 mL/hr over 30 Minutes Intravenous  Once 11/21/19 1631 11/21/19 1806   11/21/19 1645  azithromycin (ZITHROMAX) 500 mg in sodium chloride 0.9 % 250 mL IVPB        500 mg 250 mL/hr over 60 Minutes Intravenous  Once 11/21/19 1631 11/22/19 0159     Objective: Vitals: Today's Vitals   11/23/19 0000 11/23/19 0228 11/23/19 0400 11/23/19 0500  BP: (!) 131/51  (!) 125/49   Pulse: (!) 102  100   Resp: 16  16   Temp: 98.7 F (37.1 C)  100.2 F (37.9 C)   TempSrc: Oral  Oral   SpO2: 96%  95%   Weight:  66.7 kg    Height:      PainSc:    Asleep    Intake/Output Summary (Last 24 hours) at 11/23/2019 0742 Last data filed at 11/23/2019 0600 Gross per 24 hour  Intake 802.02 ml  Output --  Net 802.02 ml   Filed Weights   11/21/19 1330 11/22/19 1215 11/23/19 0228  Weight: 63.5 kg 63.5 kg 66.7 kg   Weight change: 0 kg  Intake/Output from previous day: 11/06 0701 - 11/07 0700 In: 802 [P.O.:720; IV Piggyback:82] Out: -  Intake/Output this shift: No intake/output data recorded.  Examination: General exam: AAOX3 , NAD, weak appearing. HEENT:Oral mucosa moist, Ear/Nose WNL grossly, dentition  normal. Respiratory system: bilaterally clear,no wheezing or crackles,no use of accessory muscle Cardiovascular system: S1 & S2 +, No JVD,. Gastrointestinal system: Abdomen soft, NT,ND, BS+ Nervous System:Alert, awake, moving extremities and grossly nonfocal Extremities: No edema, distal peripheral pulses palpable.  Skin: No rashes,no icterus. MSK: Normal muscle bulk,tone, power  Data Reviewed: I have personally reviewed following labs and imaging studies CBC: Recent Labs  Lab 11/21/19 1445 11/22/19 0507  WBC 13.6* 10.8*  NEUTROABS 10.7*  --   HGB 9.2* 8.3*  HCT 30.9* 28.4*  MCV 76.5* 77.8*  PLT 364 001   Basic Metabolic Panel: Recent Labs  Lab 11/21/19 1445 11/22/19 0507  NA 132* 137  K 4.2 4.3  CL 101 105  CO2 22 22  GLUCOSE 214* 123*  BUN 15 15  CREATININE 0.86 0.86  CALCIUM 8.2* 8.4*   GFR: Estimated Creatinine Clearance: 55.5 mL/min (by C-G formula based on SCr of 0.86 mg/dL). Liver Function Tests: Recent Labs  Lab 11/21/19 1445  AST 25  ALT 11  ALKPHOS 58  BILITOT 0.7  PROT 7.3  ALBUMIN 2.8*   No results for input(s): LIPASE, AMYLASE in the last 168 hours. No results for input(s): AMMONIA in the last 168 hours. Coagulation Profile: No results for input(s): INR, PROTIME in the last 168 hours. Cardiac Enzymes: No results for input(s): CKTOTAL, CKMB, CKMBINDEX, TROPONINI in the last 168 hours. BNP (last 3 results) No results for input(s): PROBNP in the last 8760 hours. HbA1C: Recent Labs    11/22/19 0507  HGBA1C 6.1*   CBG: Recent Labs  Lab 11/22/19 0823 11/22/19 1210 11/22/19 1422 11/22/19 1659 11/22/19 2105  GLUCAP 97 145* 123* 126* 178*   Lipid Profile: No results for input(s): CHOL, HDL, LDLCALC, TRIG, CHOLHDL, LDLDIRECT in the last 72 hours. Thyroid Function Tests: Recent Labs    11/22/19 0507  TSH 6.024*   Anemia Panel: Recent Labs    11/22/19 1448  TIBC 276  IRON 9*   Sepsis Labs: Recent Labs  Lab 11/21/19 1723  11/22/19 0507  PROCALCITON  --  0.59  LATICACIDVEN 1.5  --     Recent Results (from the past 240 hour(s))  Respiratory Panel by RT PCR (Flu A&B, Covid) - Nasopharyngeal Swab     Status: None   Collection Time: 11/21/19  2:52 PM   Specimen: Nasopharyngeal Swab  Result Value Ref Range Status   SARS Coronavirus 2 by RT PCR NEGATIVE NEGATIVE Final    Comment: (NOTE) SARS-CoV-2 target nucleic acids are NOT DETECTED.  The SARS-CoV-2 RNA is generally  detectable in upper respiratoy specimens during the acute phase of infection. The lowest concentration of SARS-CoV-2 viral copies this assay can detect is 131 copies/mL. A negative result does not preclude SARS-Cov-2 infection and should not be used as the sole basis for treatment or other patient management decisions. A negative result may occur with  improper specimen collection/handling, submission of specimen other than nasopharyngeal swab, presence of viral mutation(s) within the areas targeted by this assay, and inadequate number of viral copies (<131 copies/mL). A negative result must be combined with clinical observations, patient history, and epidemiological information. The expected result is Negative.  Fact Sheet for Patients:  PinkCheek.be  Fact Sheet for Healthcare Providers:  GravelBags.it  This test is no t yet approved or cleared by the [place] FDA and  has been authorized for detection and/or diagnosis of SARS-CoV-2 by FDA under an Emergency Use Authorization (EUA). This EUA will remain  in effect (meaning this test can be used) for the duration of the COVID-19 declaration under Section 564(b)(1) of the Act, 21 U.S.C. section 360bbb-3(b)(1), unless the authorization is terminated or revoked sooner.     Influenza A by PCR NEGATIVE NEGATIVE Final   Influenza B by PCR NEGATIVE NEGATIVE Final    Comment: (NOTE) The Xpert Xpress SARS-CoV-2/FLU/RSV assay is  intended as an aid in  the diagnosis of influenza from Nasopharyngeal swab specimens and  should not be used as a sole basis for treatment. Nasal washings and  aspirates are unacceptable for Xpert Xpress SARS-CoV-2/FLU/RSV  testing.  Fact Sheet for Patients: PinkCheek.be  Fact Sheet for Healthcare Providers: GravelBags.it  This test is not yet approved or cleared by the [place] FDA and  has been authorized for detection and/or diagnosis of SARS-CoV-2 by  FDA under an Emergency Use Authorization (EUA). This EUA will remain  in effect (meaning this test can be used) for the duration of the  Covid-19 declaration under Section 564(b)(1) of the Act, 21  U.S.C. section 360bbb-3(b)(1), unless the authorization is  terminated or revoked. Performed at [facility], [address] [place], [place] [address]   Blood culture (routine x 2)     Status: None (Preliminary result)   Collection Time: 11/21/19  5:25 PM   Specimen: BLOOD RIGHT HAND  Result Value Ref Range Status   Specimen Description   Final    BLOOD RIGHT HAND Performed at [facility], [address] [place], [place] [address]    Special Requests   Final    BOTTLES DRAWN AEROBIC AND ANAEROBIC Blood Culture adequate volume Performed at [facility], [address] [place], [place] [address]    Culture   Final    NO GROWTH < 12 HOURS Performed at [facility], [place] [address], [place], [place] [address]    Report Status PENDING  Incomplete  Blood culture (routine x 2)     Status: None (Preliminary result)   Collection Time: 11/21/19  5:25 PM   Specimen: BLOOD LEFT FOREARM  Result Value Ref Range Status   Specimen Description   Final    BLOOD LEFT FOREARM Performed at [facility], [address] [place], [place] [address]    Special Requests   Final    BOTTLES DRAWN AEROBIC AND ANAEROBIC Blood Culture  adequate volume Performed at [facility], [address] [place], [place] [address]    Culture   Final  NO GROWTH < 12 HOURS Performed at [facility] [address], [place], [place] [address]    Report Status PENDING  Incomplete  Sputum culture     Status: None   Collection Time: 11/21/19  9:32 PM   Specimen: Expectorated Sputum  Result Value Ref Range Status   Specimen Description Expect. Sput  Final   Special Requests NONE  Final   Sputum evaluation   Final    THIS SPECIMEN IS ACCEPTABLE FOR SPUTUM CULTURE Performed at [facility], [address] [place], [place] [address]    Report Status 11/22/2019 FINAL  Final  Culture, respiratory     Status: None (Preliminary result)   Collection Time: 11/21/19  9:32 PM  Result Value Ref Range Status   Specimen Description Expect. Sput  Final   Special Requests NONE Reflexed from [id number]  Final   Gram Stain   Final    RARE WBC PRESENT, PREDOMINANTLY PMN FEW GRAM POSITIVE RODS RARE GRAM POSITIVE COCCI IN CLUSTERS Performed at [facility], [place] [address] [place], [place] [address]    Culture PENDING  Incomplete   Report Status PENDING  Incomplete     Radiology Studies: DG Shoulder 1V Left  Result Date: 11/22/2019 CLINICAL DATA:  Anterior shoulder pain EXAM: LEFT SHOULDER COMPARISON:  None. FINDINGS: There is no evidence of fracture or dislocation. There is no evidence of arthropathy or other focal bone abnormality. Soft tissues are unremarkable. IMPRESSION: Negative. Electronically Signed   By: Prudencio Pair M.D.   On: 11/22/2019 20:02   CT Angio Chest PE W and/or Wo Contrast  Result Date: 11/21/2019 CLINICAL DATA:  Shortness of breath EXAM: CT ANGIOGRAPHY CHEST WITH CONTRAST TECHNIQUE: Multidetector CT imaging of the chest was performed using the standard protocol during bolus administration of intravenous contrast. Multiplanar CT image reconstructions and MIPs were obtained to evaluate the vascular  anatomy. CONTRAST:  76m OMNIPAQUE IOHEXOL 350 MG/ML SOLN COMPARISON:  None. FINDINGS: Cardiovascular: There is a optimal opacification of the pulmonary arteries. There is no central,segmental, or subsegmental filling defects within the pulmonary arteries. There is mild cardiomegaly. A moderate pericardial effusion is seen. No evidence right heart strain. There is normal three-vessel brachiocephalic anatomy without proximal stenosis. Scattered aortic atherosclerosis is seen. There is coronary artery calcifications present. Mediastinum/Nodes: No hilar, mediastinal, or axillary adenopathy. Thyroid gland, trachea, and esophagus demonstrate no significant findings. Lungs/Pleura: Patchy airspace opacities are seen at the posterior bilateral lung bases. There is also a small amount of patchy airspace opacity at the posterior left upper lobe. There is a trace left pleural effusion present. Upper Abdomen: No acute abnormalities present in the visualized portions of the upper abdomen. Musculoskeletal: No chest wall abnormality. No acute or significant osseous findings. Review of the MIP images confirms the above findings. IMPRESSION: No central, segmental, or subsegmental pulmonary embolism. Moderate pericardial effusion with mild cardiomegaly. No evidence of right ventricular heart strain. Multifocal patchy airspace opacities, predominantly within both lung bases which may be due to infectious etiology. Trace left pleural effusion. Aortic Atherosclerosis (ICD10-I70.0). Electronically Signed   By: BPrudencio PairM.D.   On: 11/21/2019 17:10   DG Chest Portable 1 View  Result Date: 11/21/2019 CLINICAL DATA:  Cough, fever, shortness of breath, COVID-19 on 10/11/2019 EXAM: PORTABLE CHEST 1 VIEW COMPARISON:  Portable exam 1334 hours without priors available for comparison FINDINGS: Enlargement of cardiac silhouette with slight pulmonary vascular congestion. Central pulmonary arterial enlargement at the LEFT hilum. Interstitial  infiltrates  at the mid to lower lungs greater at LEFT base laterally, could represent asymmetric edema over infection. No pleural effusion or pneumothorax. Bones demineralized. IMPRESSION: Enlargement of cardiac silhouette with slight vascular congestion. Bibasilar infiltrates slightly greater on LEFT, could represent asymmetric pulmonary edema or pneumonia. Electronically Signed   By: Lavonia Dana M.D.   On: 11/21/2019 13:58   ECHOCARDIOGRAM COMPLETE  Result Date: 11/22/2019    ECHOCARDIOGRAM REPORT   Patient Name:   Jacqueline Harris Date of Exam: 11/22/2019 Medical Rec #:  [id number]      Height:       64.0 in Accession #:    [id number]     Weight:       140.0 lb Date of Birth:  11/20/1945      BSA:          1.681 m Patient Age:    84 years       BP:           114/55 mmHg Patient Gender: F              HR:           100 bpm. Exam Location:  Inpatient Procedure: 2D Echo, Cardiac Doppler and Color Doppler STAT ECHO Indications:    Pericardial effusion 423.9 / I31.3  History:        Patient has no prior history of Echocardiogram examinations.                 Risk Factors:Former Smoker.  Sonographer:    Vickie Epley RDCS Referring Phys: [id number] [place]  1. Left ventricular ejection fraction, by estimation, is 50 to 55%. The left ventricle has low normal function. The left ventricle demonstrates global hypokinesis. Left ventricular diastolic parameters are consistent with Grade I diastolic dysfunction (impaired relaxation). There is the interventricular septum is flattened in systole and diastole, consistent with right ventricular pressure and volume overload.  2. Right ventricular systolic function is moderately reduced. The right ventricular size is moderately enlarged. There is severely elevated pulmonary artery systolic pressure. The estimated right ventricular systolic pressure is 95.7 mmHg.  3. A small pericardial effusion is present. The pericardial effusion is posterior and lateral to the left  ventricle. There is no evidence of cardiac tamponade.  4. The mitral valve is normal in structure. No evidence of mitral valve regurgitation. No evidence of mitral stenosis.  5. Tricuspid valve regurgitation is moderate.  6. The aortic valve is normal in structure. Aortic valve regurgitation is not visualized. Mild to moderate aortic valve sclerosis/calcification is present, without any evidence of aortic stenosis.  7. The inferior vena cava is normal in size with greater than 50% respiratory variability, suggesting right atrial pressure of 3 mmHg. FINDINGS  Left Ventricle: Left ventricular ejection fraction, by estimation, is 50 to 55%. The left ventricle has low normal function. The left ventricle demonstrates global hypokinesis. The left ventricular internal cavity size was normal in size. There is no left ventricular hypertrophy. The interventricular septum is flattened in systole and diastole, consistent with right ventricular pressure and volume overload. Left ventricular diastolic parameters are consistent with Grade I diastolic dysfunction (impaired relaxation). Normal left ventricular filling pressure. Right Ventricle: The right ventricular size is moderately enlarged. No increase in right ventricular wall thickness. Right ventricular systolic function is moderately reduced. There is severely elevated pulmonary artery systolic pressure. The tricuspid regurgitant velocity is 4.02 m/s, and with an assumed right atrial pressure of 3 mmHg, the estimated right  ventricular systolic pressure is 30.1 mmHg. Left Atrium: Left atrial size was normal in size. Right Atrium: Right atrial size was normal in size. Pericardium: A small pericardial effusion is present. The pericardial effusion is posterior and lateral to the left ventricle. There is no evidence of cardiac tamponade. Mitral Valve: The mitral valve is normal in structure. Mild mitral annular calcification. No evidence of mitral valve regurgitation. No evidence  of mitral valve stenosis. Tricuspid Valve: The tricuspid valve is normal in structure. Tricuspid valve regurgitation is moderate . No evidence of tricuspid stenosis. Aortic Valve: The aortic valve is normal in structure. Aortic valve regurgitation is not visualized. Mild to moderate aortic valve sclerosis/calcification is present, without any evidence of aortic stenosis. Pulmonic Valve: The pulmonic valve was normal in structure. Pulmonic valve regurgitation is not visualized. No evidence of pulmonic stenosis. Aorta: The aortic root is normal in size and structure. Venous: The inferior vena cava is normal in size with greater than 50% respiratory variability, suggesting right atrial pressure of 3 mmHg. IAS/Shunts: No atrial level shunt detected by color flow Doppler.  LEFT VENTRICLE PLAX 2D LVOT diam:     1.70 cm     Diastology LV SV:         35          LV e' medial:    5.98 cm/s LV SV Index:   21          LV E/e' medial:  8.9 LVOT Area:     2.27 cm    LV e' lateral:   8.38 cm/s                            LV E/e' lateral: 6.3  LV Volumes (MOD) LV vol d, MOD A2C: 58.0 ml LV vol d, MOD A4C: 67.9 ml LV vol s, MOD A2C: 37.4 ml LV vol s, MOD A4C: 32.8 ml LV SV MOD A2C:     20.6 ml LV SV MOD A4C:     67.9 ml LV SV MOD BP:      27.7 ml RIGHT VENTRICLE RV S prime:     7.29 cm/s TAPSE (M-mode): 1.3 cm LEFT ATRIUM             Index       RIGHT ATRIUM           Index LA Vol (A2C):   28.5 ml 16.95 ml/m RA Area:     15.30 cm LA Vol (A4C):   28.0 ml 16.65 ml/m RA Volume:   40.50 ml  24.09 ml/m LA Biplane Vol: 29.7 ml 17.67 ml/m  AORTIC VALVE LVOT Vmax:   81.60 cm/s LVOT Vmean:  56.500 cm/s LVOT VTI:    0.155 m  AORTA Ao Root diam: 3.30 cm MITRAL VALVE               TRICUSPID VALVE MV Area (PHT): 9.14 cm    TR Peak grad:   64.6 mmHg MV Decel Time: 83 msec     TR Vmax:        402.00 cm/s MV E velocity: 53.10 cm/s MV A velocity: 87.80 cm/s  SHUNTS MV E/A ratio:  0.60        Systemic VTI:  0.16 m                             Systemic Diam: 1.70 cm Ena Dawley MD  Electronically signed by Ena Dawley MD Signature Date/Time: 11/22/2019/9:35:59 AM    Final      LOS: 2 days   Antonieta Pert, MD [facility]  11/23/2019, 7:42 AM

## 2019-11-23 NOTE — Progress Notes (Signed)
ANTICOAGULATION CONSULT NOTE - Initial Consult  Pharmacy Consult for lovenox Indication: DVT  No Known Allergies  Patient Measurements: Height: _0  (162.6 cm) Weight: 66.7 kg (147 lb 0.8 oz) IBW/kg (Calculated) : 54.7  Vital Signs: Temp: 100.2 F (37.9 C) (11/07 0400) Temp Source: Oral (11/07 0400) BP: 125/49 (11/07 0400) Pulse Rate: 100 (11/07 0400)  Labs: Recent Labs    11/21/19 1445 11/21/19 1445 11/22/19 0507 11/23/19 0715  HGB 9.2*   < > 8.3* 8.3*  HCT 30.9*  --  28.4* 27.1*  PLT 364  --  321 335  CREATININE 0.86  --  0.86 0.85   < > = values in this interval not displayed.    Assessment: 74 yo F admitted on 11/5 for cough, shortness of breath with recent COVID infection in 09/2019 for which she did not require hospitalization. Pt has known history of iron deficiency anemia. Pharmacy consulted to dose therapeutic dose lovenox after doppler study showed thrombosis of the left peroneal vein. No anticoagulants prior to admission were reported on med rec, fill history, or per chart review.   Pt did receive lovenox 45m SQ on 11/6 at 2127 for DVT prophylaxis CBC: Hgb 8.3; Plt 335.    Goal of Therapy:  Monitor platelets by anticoagulation protocol: Yes   Plan:  Discontinue DVT prophlyaxis agent: lovenox 436mQD Initiate lovenox 75m23mg subcutaneously q12h (lovenox 67m53m q12h) Monitor CBC, s/sx bleeding, and clinical progress  MadeCarolin [place]1 pharmacy resident 11/23/2019,11:43 AM

## 2019-11-24 DIAGNOSIS — I2510 Atherosclerotic heart disease of native coronary artery without angina pectoris: Secondary | ICD-10-CM

## 2019-11-24 DIAGNOSIS — I2584 Coronary atherosclerosis due to calcified coronary lesion: Secondary | ICD-10-CM

## 2019-11-24 DIAGNOSIS — I82452 Acute embolism and thrombosis of left peroneal vein: Secondary | ICD-10-CM | POA: Diagnosis not present

## 2019-11-24 DIAGNOSIS — I313 Pericardial effusion (noninflammatory): Secondary | ICD-10-CM

## 2019-11-24 LAB — CBC
HCT: 28.3 % — ABNORMAL LOW (ref 36.0–46.0)
Hemoglobin: 8.5 g/dL — ABNORMAL LOW (ref 12.0–15.0)
MCH: 23 pg — ABNORMAL LOW (ref 26.0–34.0)
MCHC: 30 g/dL (ref 30.0–36.0)
MCV: 76.5 fL — ABNORMAL LOW (ref 80.0–100.0)
Platelets: 390 10*3/uL (ref 150–400)
RBC: 3.7 MIL/uL — ABNORMAL LOW (ref 3.87–5.11)
RDW: 17.6 % — ABNORMAL HIGH (ref 11.5–15.5)
WBC: 8.2 10*3/uL (ref 4.0–10.5)
nRBC: 0 % (ref 0.0–0.2)

## 2019-11-24 LAB — BASIC METABOLIC PANEL
Anion gap: 9 (ref 5–15)
BUN: 10 mg/dL (ref 8–23)
CO2: 22 mmol/L (ref 22–32)
Calcium: 8.3 mg/dL — ABNORMAL LOW (ref 8.9–10.3)
Chloride: 104 mmol/L (ref 98–111)
Creatinine, Ser: 0.8 mg/dL (ref 0.44–1.00)
GFR, Estimated: >60 mL/min (ref >60)
Glucose, Bld: 153 mg/dL — ABNORMAL HIGH (ref 70–99)
Potassium: 4.4 mmol/L (ref 3.5–5.1)
Sodium: 135 mmol/L (ref 135–145)

## 2019-11-24 LAB — GLUCOSE, CAPILLARY
Glucose-Capillary: 146 mg/dL — ABNORMAL HIGH (ref 70–99)
Glucose-Capillary: 202 mg/dL — ABNORMAL HIGH (ref 70–99)
Glucose-Capillary: 111 mg/dL — ABNORMAL HIGH (ref 70–99)
Glucose-Capillary: 206 mg/dL — ABNORMAL HIGH (ref 70–99)

## 2019-11-24 LAB — LEGIONELLA PNEUMOPHILA SEROGP 1 UR AG: L. pneumophila Serogp 1 Ur Ag: NEGATIVE

## 2019-11-24 MED ORDER — MENTHOL 3 MG MT LOZG
1.0000 | LOZENGE | OROMUCOSAL | Status: DC | PRN
  Filled 2019-11-24: qty 9

## 2019-11-24 MED ORDER — APIXABAN 5 MG PO TABS: 5.0000 mg | ORAL_TABLET | Freq: Two times a day (BID) | ORAL | Status: DC

## 2019-11-24 MED ORDER — APIXABAN 5 MG PO TABS
10.0000 mg | ORAL_TABLET | Freq: Two times a day (BID) | ORAL | Status: DC
  Administered 2019-11-24 – 2019-11-25 (×3): 10 mg via ORAL
  Filled 2019-11-24 (×3): qty 2

## 2019-11-24 MED ORDER — ADULT MULTIVITAMIN W/MINERALS CH
1.0000 | ORAL_TABLET | Freq: Every day | ORAL | Status: DC
  Administered 2019-11-24 – 2019-11-25 (×2): 1 via ORAL
  Filled 2019-11-24 (×2): qty 1

## 2019-11-24 NOTE — Progress Notes (Addendum)
Progress Note  Patient Name: Jacqueline Harris Date of Encounter: 11/24/2019  [facility] Cardiologist: Ena Dawley, MD   Subjective   Still feels short of breath, but not on oxygen. Gets very winded with exertion. No chest pain. Tolerating anticoagulation.  Inpatient Medications    Scheduled Meds: . enoxaparin (LOVENOX) injection  70 mg Subcutaneous Q12H  . feeding supplement  237 mL Oral BID BM  . influenza vaccine adjuvanted  0.5 mL Intramuscular Tomorrow-1000  . insulin aspart  0-5 Units Subcutaneous QHS  . insulin aspart  0-9 Units Subcutaneous TID WC  . sodium chloride flush  3 mL Intravenous Q12H  . sodium chloride flush  3 mL Intravenous Q12H   Continuous Infusions: . sodium chloride 50 mL/hr at 11/21/19 1730  . sodium chloride 250 mL (11/22/19 1738)  . azithromycin 500 mg (11/23/19 1759)  . cefTRIAXone (ROCEPHIN)  IV 2 g (11/23/19 1716)   PRN Meds: sodium chloride, sodium chloride, acetaminophen **OR** acetaminophen, HYDROcodone-acetaminophen, ondansetron **OR** ondansetron (ZOFRAN) IV, polyethylene glycol, sodium chloride flush   Vital Signs    Vitals:   11/23/19 1157 11/23/19 2054 11/24/19 0512 11/24/19 0832  BP: (!) 123/52 (!) 116/54 (!) 126/58   Pulse: 88 (!) 103 (!) 106 93  Resp: 16  17   Temp: 98.6 F (37 C)  99.1 F (37.3 C)   TempSrc:   Oral   SpO2: 100% 92% 92% 90%  Weight:      Height:        Intake/Output Summary (Last 24 hours) at 11/24/2019 1143 Last data filed at 11/24/2019 0500 Gross per 24 hour  Intake --  Output 400 ml  Net -400 ml   Last 3 Weights 11/23/2019 11/22/2019 11/21/2019  Weight (lbs) 147 lb 0.8 oz 140 lb 140 lb  Weight (kg) 66.7 kg 63.504 kg 63.504 kg      Telemetry    Sinus rhythm/sinus tachycardia - Personally Reviewed  ECG    No new since 11/5- Personally Reviewed  Physical Exam   GEN: No acute distress.   Neck: No JVD Cardiac: RRR, no murmurs, rubs, or gallops.  Respiratory: Coarse breath sounds  bilaterally GI: Soft, nontender, non-distended  MS: 1+ left lower extremity edema, trivial RLE edema; No deformity. Neuro:  Nonfocal  Psych: Normal affect   Labs    High Sensitivity Troponin:  No results for input(s): TROPONINIHS in the last 720 hours.    Chemistry Recent Labs  Lab 11/21/19 1445 11/21/19 1445 11/22/19 0507 11/23/19 0715 11/24/19 0200  NA 132*   < > 137 136 135  K 4.2   < > 4.3 4.3 4.4  CL 101   < > 105 105 104  CO2 22   < > _0 GLUCOSE 214*   < > 123* 161* 153*  BUN 15   < > _1 CREATININE 0.86   < > 0.86 0.85 0.80  CALCIUM 8.2*   < > 8.4* 8.3* 8.3*  PROT 7.3  --   --   --   --   ALBUMIN 2.8*  --   --   --   --   AST 25  --   --   --   --   ALT 11  --   --   --   --   ALKPHOS 58  --   --   --   --   BILITOT 0.7  --   --   --   --  GFRNONAA >60   < > >60 >60 >60  ANIONGAP 9   < > _0 < > = values in this interval not displayed.     Hematology Recent Labs  Lab 11/22/19 0507 11/23/19 0715 11/24/19 0200  WBC 10.8* 9.2 8.2  RBC 3.65* 3.58*  3.64* 3.70*  HGB 8.3* 8.3* 8.5*  HCT 28.4* 27.1* 28.3*  MCV 77.8* 75.7* 76.5*  MCH 22.7* 23.2* 23.0*  MCHC 29.2* 30.6 30.0  RDW 17.6* 17.6* 17.6*  PLT 321 335 390    BNP Recent Labs  Lab 11/21/19 1458  BNP 492.4*     DDimer No results for input(s): DDIMER in the last 168 hours.   Radiology    DG Shoulder 1V Left  Result Date: 11/22/2019 CLINICAL DATA:  Anterior shoulder pain EXAM: LEFT SHOULDER COMPARISON:  None. FINDINGS: There is no evidence of fracture or dislocation. There is no evidence of arthropathy or other focal bone abnormality. Soft tissues are unremarkable. IMPRESSION: Negative. Electronically Signed   By: Prudencio Pair M.D.   On: 11/22/2019 20:02   VAS [place] LOWER EXTREMITY VENOUS (DVT)  Result Date: 11/23/2019  Lower Venous DVT Study Indications: Pain, and Swelling.  Comparison Study: No prior study Performing Technologist: Sharion Dove RVS  Examination Guidelines: A  complete evaluation includes B-mode imaging, spectral Doppler, color Doppler, and power Doppler as needed of all accessible portions of each vessel. Bilateral testing is considered an integral part of a complete examination. Limited examinations for reoccurring indications may be performed as noted. The reflux portion of the exam is performed with the patient in reverse Trendelenburg.  +---------+---------------+---------+-----------+----------+--------------+ RIGHT    CompressibilityPhasicitySpontaneityPropertiesThrombus Aging +---------+---------------+---------+-----------+----------+--------------+ CFV      Full           Yes      Yes                                 +---------+---------------+---------+-----------+----------+--------------+ SFJ      Full                                                        +---------+---------------+---------+-----------+----------+--------------+ FV Prox  Full                                                        +---------+---------------+---------+-----------+----------+--------------+ FV Mid   Full                                                        +---------+---------------+---------+-----------+----------+--------------+ FV DistalFull                                                        +---------+---------------+---------+-----------+----------+--------------+ PFV      Full                                                        +---------+---------------+---------+-----------+----------+--------------+  POP      Full           Yes      Yes                                 +---------+---------------+---------+-----------+----------+--------------+ PTV      Full                                                        +---------+---------------+---------+-----------+----------+--------------+ PERO     Full                                                         +---------+---------------+---------+-----------+----------+--------------+   +---------+---------------+---------+-----------+----------+--------------+ LEFT     CompressibilityPhasicitySpontaneityPropertiesThrombus Aging +---------+---------------+---------+-----------+----------+--------------+ CFV      Full           Yes      Yes                                 +---------+---------------+---------+-----------+----------+--------------+ SFJ      Full                                                        +---------+---------------+---------+-----------+----------+--------------+ FV Prox  Full                                                        +---------+---------------+---------+-----------+----------+--------------+ FV Mid   Full                                                        +---------+---------------+---------+-----------+----------+--------------+ FV DistalFull                                                        +---------+---------------+---------+-----------+----------+--------------+ PFV      Full                                                        +---------+---------------+---------+-----------+----------+--------------+ POP      Full           Yes      Yes                                 +---------+---------------+---------+-----------+----------+--------------+  PTV      Full                                                        +---------+---------------+---------+-----------+----------+--------------+ PERO     None                                         Acute          +---------+---------------+---------+-----------+----------+--------------+     Summary: RIGHT: - There is no evidence of deep vein thrombosis in the lower extremity.  LEFT: - Findings consistent with acute deep vein thrombosis involving the left peroneal veins.  *See table(s) above for measurements and observations. Electronically signed by Servando Snare MD on 11/23/2019 at 8:36:58 PM.    Final     Cardiac Studies   Echo 11/22/19 1. Left ventricular ejection fraction, by estimation, is 50 to 55%. The  left ventricle has low normal function. The left ventricle demonstrates  global hypokinesis. Left ventricular diastolic parameters are consistent  with Grade I diastolic dysfunction  (impaired relaxation). There is the interventricular septum is flattened  in systole and diastole, consistent with right ventricular pressure and  volume overload.  2. Right ventricular systolic function is moderately reduced. The right  ventricular size is moderately enlarged. There is severely elevated  pulmonary artery systolic pressure. The estimated right ventricular  systolic pressure is 65.7 mmHg.  3. A small pericardial effusion is present. The pericardial effusion is  posterior and lateral to the left ventricle. There is no evidence of  cardiac tamponade.  4. The mitral valve is normal in structure. No evidence of mitral valve  regurgitation. No evidence of mitral stenosis.  5. Tricuspid valve regurgitation is moderate.  6. The aortic valve is normal in structure. Aortic valve regurgitation is  not visualized. Mild to moderate aortic valve sclerosis/calcification is  present, without any evidence of aortic stenosis.  7. The inferior vena cava is normal in size with greater than 50%  respiratory variability, suggesting right atrial pressure of 3 mmHg.   Venous doppler 11/23/19 Summary:  RIGHT:  - There is no evidence of deep vein thrombosis in the lower extremity.    LEFT:  - Findings consistent with acute deep vein thrombosis involving the left  peroneal veins.  Patient Profile     74 y.o. female with recent Covid infection, presenting with cough, fever, shortness of breath. Found to have multifocal pneumonia and pericardial effusion.  Assessment & Plan    Pericardial effusion: -no evidence of tamponade -would repeat echo as  an outpatient once pulmonary issues have improved  Acute respiratory failure with hypoxia Multifocal pneumonia -management per primary team  RV dilation/dysfunction -given acute pulmonary illness, suspect findings are secondary to lung disease -no PE on CTA -normal LV function  LE edema: -noted to have acute left DVT in peroneal veins -currently on lovenox. Concern given microcytic anemia with elevated immature reticulocytes. Would convert to [place] with close monitoring of CBC.  Coronary calcifications Aortic atherosclerosis -recommend statin. Can start as an outpatient given acute illness -would hold on aspirin given need for anticoagulation, above -lipid panel reviewed from this admission  [facility] will sign off.   Medication Recommendations:  Change to  DOAC for acute DVT.  Other recommendations (labs, testing, etc):  PCP close follow up of CBC Follow up as an outpatient:  We will arrange for outpatient follow up with Dr. Meda Coffee or a member of her team.  For questions or updates, please contact [facility] Please consult [email or web address] for contact info under        Signed, Buford Dresser, MD  11/24/2019, 11:43 AM

## 2019-11-24 NOTE — Progress Notes (Signed)
[place] for lovenox transition to Eliquis Indication: DVT  No Known Allergies  Patient Measurements: Height: _0  (162.6 cm) Weight: 66.7 kg (147 lb 0.8 oz) IBW/kg (Calculated) : 54.7  Vital Signs: Temp: 99.1 F (37.3 C) (11/08 0512) Temp Source: Oral (11/08 0512) BP: 126/58 (11/08 0512) Pulse Rate: 93 (11/08 0832)  Labs: Recent Labs    11/22/19 0507 11/22/19 0507 11/23/19 0715 11/24/19 0200  HGB 8.3*   < > 8.3* 8.5*  HCT 28.4*  --  27.1* 28.3*  PLT 321  --  335 390  CREATININE 0.86  --  0.85 0.80   < > = values in this interval not displayed.    Assessment: 74 yo F admitted on 11/5 for cough, shortness of breath with recent COVID infection in 09/2019 for which she did not require hospitalization. Pt has known history of iron deficiency anemia. Pharmacy consulted to dose therapeutic dose lovenox after doppler study showed thrombosis of the left peroneal vein. No anticoagulants prior to admission were reported on med rec, fill history, or per chart review.   Pt did receive lovenox 54m SQ on 11/6 at 2127 for DVT prophylaxis CBC: Hgb 8.3; Plt 335.   Pharmacy consulted to switch from Lovenox to Eliquis.  Last dose of Lovenox 11/7 at midnight.  Goal of Therapy:  Monitor platelets by anticoagulation protocol: Yes   Plan:  Discontinue lovenox 752mSQ q12h  Start Apixaban 10 mg po bid x 7 days, then 5 mg po bid Monitor CBC, s/sx bleeding, and clinical progress  CaAlanda SlimPharmD, FCBingham Memorial Hospitallinical Pharmacist Please see AMION for all Pharmacists' Contact Phone Numbers 11/24/2019, 12:13 PM

## 2019-11-24 NOTE — Evaluation (Signed)
Occupational Therapy Evaluation Patient Details Name: Jacqueline Harris MRN: [id number] DOB: 04/06/1945 Today's Date: 11/24/2019    History of Present Illness Pt is a 74 y/o female presenting to the ED on 11/5 for cough, SOB, and fever. pt with PNA, sepsis and LLE DVT. Pt had COVID in September but recovered. Pt has a PMH of prediabetes, anemia, and right ventricular failure.   Clinical Impression   PTA patient independent and driving. Admitted for above and limited by problem list below, including generalized weakness, decreased activity tolerance.  Patient currently requires supervision for mobility and transfers in room, ADLs with supervision. Initiated educated on energy conservation techniques and requires cueing for PLB throughout session. She is on RA during session with SpO2 at 95% at rest, desaturation to 82% during ADL tasks in room. Patient will benefit from continued OT services while admitted to optimize independence, activity tolerance and safety with ADls, mobility.  Will follow.     Follow Up Recommendations  Home health OT;Supervision - Intermittent    Equipment Recommendations  3 in 1 bedside commode    Recommendations for Other Services       Precautions / Restrictions Precautions Precautions: Other (comment) Precaution Comments: watch sats Restrictions Weight Bearing Restrictions: No      Mobility Bed Mobility               General bed mobility comments: OOB in recliner upon entry     Transfers Overall transfer level: Needs assistance   Transfers: Sit to/from Stand Sit to Stand: Supervision         General transfer comment: safety    Balance Overall balance assessment: Mild deficits observed, not formally tested                                         ADL either performed or assessed with clinical judgement   ADL Overall ADL's : Needs assistance/impaired     Grooming: Supervision/safety;Standing   Upper Body Bathing:  Supervision/ safety;Sitting   Lower Body Bathing: Supervison/ safety;Sit to/from stand   Upper Body Dressing : Supervision/safety;Sitting   Lower Body Dressing: Supervision/safety;Sit to/from stand   Toilet Transfer: Supervision/safety;Ambulation Toilet Transfer Details (indicate cue type and reason): simulated in room     Tub/ Shower Transfer: Walk-in shower;Supervision/safety;Ambulation;Grab bars;Shower Scientist, research (medical) Details (indicate cue type and reason): simulated in room  Functional mobility during ADLs: Supervision/safety General ADL Comments: educated initated on Social research officer, government      Pertinent Vitals/Pain Pain Assessment: No/denies pain     Hand Dominance Right   Extremity/Trunk Assessment Upper Extremity Assessment Upper Extremity Assessment: Overall WFL for tasks assessed   Lower Extremity Assessment Lower Extremity Assessment: Defer to PT evaluation   Cervical / Trunk Assessment Cervical / Trunk Assessment: Normal   Communication Communication Communication: No difficulties   Cognition Arousal/Alertness: Awake/alert Behavior During Therapy: WFL for tasks assessed/performed Overall Cognitive Status: Within Functional Limits for tasks assessed                                     General Comments  SpO2 on RA 95% at rest, desaturation to 82% during minimal ADLs in room given cueing for PLB throughout session; reviewed  use of incentive spirometer     Exercises     Shoulder Instructions      Home Living Family/patient expects to be discharged to:: Private residence Living Arrangements: Other relatives (2 granddaughter) Available Help at Discharge: Family (granddaguthers; son lives 5 minutes away ) Type of Home: House Home Access: Stairs to enter Technical brewer of Steps: 13 Entrance Stairs-Rails: Can reach both Home Layout: One level;Other (Comment)  (granddaughter in basement )     Bathroom Shower/Tub: Walk-in Psychologist, prison and probation services: Standard     Home Equipment: Grab bars - tub/shower;Shower seat - built in          Prior Functioning/Environment Level of Independence: Independent        Comments: driving         OT Problem List: Decreased strength;Decreased activity tolerance;Impaired balance (sitting and/or standing);Cardiopulmonary status limiting activity;Decreased knowledge of use of DME or AE;Decreased knowledge of precautions      OT Treatment/Interventions: Self-care/ADL training;Energy conservation;DME and/or AE instruction;Therapeutic activities;Patient/family education;Balance training    OT Goals(Current goals can be found in the care plan section) Acute Rehab OT Goals Patient Stated Goal: return home and to church OT Goal Formulation: With patient Time For Goal Achievement: 12/08/19 Potential to Achieve Goals: Good  OT Frequency: Min 2X/week   Barriers to D/C:            Co-evaluation              AM-PAC OT "6 Clicks" Daily Activity     Outcome Measure Help from another person eating meals?: None Help from another person taking care of personal grooming?: A Little Help from another person toileting, which includes using toliet, bedpan, or urinal?: A Little Help from another person bathing (including washing, rinsing, drying)?: A Little Help from another person to put on and taking off regular upper body clothing?: A Little Help from another person to put on and taking off regular lower body clothing?: A Little 6 Click Score: 19   End of Session Nurse Communication: Mobility status  Activity Tolerance: Patient tolerated treatment well Patient left: in chair;with call bell/phone within reach;with chair alarm set;with family/visitor present  OT Visit Diagnosis: Muscle weakness (generalized) (M62.81)                Time: 0940-7680 OT Time Calculation (min): 23 min Charges:  OT General  Charges $OT Visit: 1 Visit OT Evaluation $OT Eval Moderate Complexity: 1 Mod OT Treatments $Self Care/Home Management : 8-22 mins  Jolaine Artist, OT Acute Rehabilitation Services Pager [phone] Office [phone]   Delight Stare 11/24/2019, 12:56 PM

## 2019-11-24 NOTE — Progress Notes (Signed)
Initial Nutrition Assessment  DOCUMENTATION CODES:   Non-severe (moderate) malnutrition in context of acute illness/injury  INTERVENTION:   - Continue Ensure Enlive po BID, each supplement provides 350 kcal and 20 grams of protein  - MVI with minerals daily  - Liberalize diet to Regular, verbal with readback order placed per MD  - Discussed ways to increase kcal and protein  - Encourage adequate PO intake  NUTRITION DIAGNOSIS:   Moderate Malnutrition related to acute illness as evidenced by mild muscle depletion, mild fat depletion.  GOAL:   Patient will meet greater than or equal to 90% of their needs  MONITOR:   PO intake, Supplement acceptance, Labs, Weight trends  REASON FOR ASSESSMENT:   Malnutrition Screening Tool    ASSESSMENT:   74 year old female who presented to the ED on 11/05 with fever, cough, SOB. PMH of prediabetes, iron deficiency anemia, COVID-19 infection in September 2021. Pt admitted with sepsis due to multifocal pneumonia, moderate pericardial effusion.   Spoke with pt at bedside. Pt reports decreased appetite since she retired about 2 years ago. Pt reports a significantly decreased appetite and PO intake when she had COVID-19 in September of this year. Pt reports that during this time she was consuming Boost shakes and electrolyte replacement beverages but not eating much solid food for about 2 weeks. Pt states that during this time that she lost about 5 lbs.  Weight history in chart is limited. Last available weight PTA is from 2016.  Pt reports that she did eat some for breakfast and lunch today. Pt had some soup from [employer] for lunch today and tolerated that well. She has consumed some Ensure Enlive. Will continue with current order of Ensure Enlive BID and order MVI with minerals as well.  Pt reports that she has had some taste changes since COVID. She reports that food does not taste as good as it used to and uses meatloaf as an  example.  Discussed diet liberalization with MD who agreed. Verbal with readback order placed for Regular diet.  Discussed with pt ways to increase kcal and protein by eating smaller, more frequent meals. Discussed food sources of protein.  Medications reviewed and include: Ensure Enlive BID, SSI, IV abx  Labs reviewed: hemoglobin 8.5 CBG's: 121-188 x 24 hours  NUTRITION - FOCUSED PHYSICAL EXAM:    Most Recent Value  Orbital Region Mild depletion  Upper Arm Region No depletion  Thoracic and Lumbar Region No depletion  Buccal Region Moderate depletion  Temple Region Moderate depletion  Clavicle Bone Region Mild depletion  Clavicle and Acromion Bone Region Mild depletion  Scapular Bone Region Mild depletion  Dorsal Hand No depletion  Patellar Region Mild depletion  Anterior Thigh Region Mild depletion  Posterior Calf Region Mild depletion       Diet Order:   Diet Order            Diet regular Room service appropriate? Yes; Fluid consistency: Thin  Diet effective now                 EDUCATION NEEDS:   Education needs have been addressed  Skin:  Skin Assessment: Reviewed RN Assessment  Last BM:  11/23/19  Height:   Ht Readings from Last 1 Encounters:  11/22/19 _0  (1.626 m)    Weight:   Wt Readings from Last 1 Encounters:  11/23/19 66.7 kg    Ideal Body Weight:  54.5 kg  BMI:  Body mass index is 25.24 kg/m.  Estimated  Nutritional Needs:   Kcal:  1800-2000  Protein:  85-100 grams  Fluid:  1.8-2.0 L    Gaynell Face, MS, RD, LDN Inpatient Clinical Dietitian Please see AMiON for contact information.

## 2019-11-24 NOTE — Evaluation (Signed)
Physical Therapy Evaluation Patient Details Name: Jacqueline Harris MRN: [id number] DOB: 12-04-1945 Today's Date: 11/24/2019   History of Present Illness  Pt is a 74 y/o female presenting to the ED on 11/5 for cough, SOB, and fever. pt with PNA, sepsis and LLE DVT. Pt had COVID in September but recovered. Pt has a PMH of prediabetes, anemia, and right ventricular failure.  Clinical Impression  Pt very pleasant and agreeable to all mobility. Pt able to walk short distance in room with fatigue and hallway with SpO2 dropping to 86%. Pt educated for pursed lip breathing, energy conservation, use of IS as pt only able to achieve 500cc and activity progression. Pt has flight of stairs to enter home and family assist at D/C. Pt with decreased activity tolerance, pulmonary function, and mobility who will benefit from acute therapy to maximize mobility, safety and independence. Pt encouraged to walk to bathroom throughout the day and increase activity with SpO2 monitored.  86% on RA with gait 91% on RA at rest    Follow Up Recommendations Home health PT    Equipment Recommendations  3in1 (PT)    Recommendations for Other Services       Precautions / Restrictions Precautions Precautions: Other (comment) Precaution Comments: watch sats      Mobility  Bed Mobility Overal bed mobility: Modified Independent             General bed mobility comments: HOB 30 degrees    Transfers Overall transfer level: Modified independent               General transfer comment: pt able to stand from bed and bSC  Ambulation/Gait Ambulation/Gait assistance: Supervision Gait Distance (Feet): 120 Feet Assistive device: None Gait Pattern/deviations: Step-through pattern;Decreased stride length   Gait velocity interpretation: 1.31 - 2.62 ft/sec, indicative of limited community ambulator General Gait Details: pt with decreased stride with pt reaching out for environmental support initially but then  able to walk without support, pt able to walk to and from bathroom but limited by fatigue. pt then walked in hallway with sats dropping to 86% with cues for pursed lip breathing, energy conservation and standing rest during gait. 91% on RA with return to sitting , HR 120  Stairs            Wheelchair Mobility    Modified Rankin (Stroke Patients Only)       Balance Overall balance assessment: Mild deficits observed, not formally tested                                           Pertinent Vitals/Pain Pain Assessment: Faces Faces Pain Scale: Hurts a little bit Pain Location: left shoulder Pain Descriptors / Indicators: Aching;Guarding Pain Intervention(s): Limited activity within patient's tolerance;Repositioned    Home Living Family/patient expects to be discharged to:: Private residence Living Arrangements: Other relatives (lives with 2 granddaughters) Available Help at Discharge: Family (2 granddaughters; son lives 5 minutes away; 1 granddaughter works third shift) Type of Home: House Home Access: Stairs to enter Entrance Stairs-Rails: Can reach both Technical brewer of Steps: 13 Home Layout: Two level Home Equipment: Grab bars - tub/shower      Prior Function Level of Independence: Independent               Hand Dominance        Extremity/Trunk Assessment   Upper Extremity  Assessment Upper Extremity Assessment: Overall WFL for tasks assessed    Lower Extremity Assessment Lower Extremity Assessment: Overall WFL for tasks assessed    Cervical / Trunk Assessment Cervical / Trunk Assessment: Normal  Communication   Communication: No difficulties  Cognition Arousal/Alertness: Awake/alert Behavior During Therapy: WFL for tasks assessed/performed Overall Cognitive Status: Within Functional Limits for tasks assessed                                        General Comments      Exercises     Assessment/Plan     PT Assessment Patient needs continued PT services  PT Problem List Decreased activity tolerance;Decreased mobility;Decreased balance;Cardiopulmonary status limiting activity       PT Treatment Interventions Gait training;Functional mobility training;Stair training;Balance training;Therapeutic activities;Therapeutic exercise;Patient/family education;DME instruction    PT Goals (Current goals can be found in the Care Plan section)  Acute Rehab PT Goals Patient Stated Goal: return home and to church PT Goal Formulation: With patient Time For Goal Achievement: 12/08/19 Potential to Achieve Goals: Good    Frequency     Barriers to discharge        Co-evaluation               AM-PAC PT "6 Clicks" Mobility  Outcome Measure Help needed turning from your back to your side while in a flat bed without using bedrails?: None Help needed moving from lying on your back to sitting on the side of a flat bed without using bedrails?: None Help needed moving to and from a bed to a chair (including a wheelchair)?: None Help needed standing up from a chair using your arms (e.g., wheelchair or bedside chair)?: A Little Help needed to walk in hospital room?: A Little Help needed climbing 3-5 steps with a railing? : A Little 6 Click Score: 21    End of Session Equipment Utilized During Treatment: Gait belt Activity Tolerance: Patient limited by fatigue Patient left: in chair;with call bell/phone within reach;with chair alarm set Nurse Communication: Mobility status PT Visit Diagnosis: Other abnormalities of gait and mobility (R26.89);Muscle weakness (generalized) (M62.81);Difficulty in walking, not elsewhere classified (R26.2)    Time: 1505-6979 PT Time Calculation (min) (ACUTE ONLY): 30 min   Charges:   PT Evaluation $PT Eval Moderate Complexity: 1 Mod PT Treatments $Gait Training: 8-22 mins        Toneshia Coello P, PT Acute Rehabilitation Services Pager: [phone] Office:  [place] 11/24/2019, 9:34 AM

## 2019-11-24 NOTE — Discharge Instructions (Signed)
Information on my medicine - ELIQUIS (apixaban)  This medication education was reviewed with me or my healthcare representative as part of my discharge preparation.    Why was Eliquis prescribed for you? Eliquis was prescribed to treat blood clots that may have been found in the veins of your legs (deep vein thrombosis) or in your lungs (pulmonary embolism) and to reduce the risk of them occurring again.  What do You need to know about Eliquis ? The starting dose is 10 mg (two 5 mg tablets) taken TWICE daily for the FIRST SEVEN (7) DAYS, then on 12/01/19  the dose is reduced to ONE 5 mg tablet taken TWICE daily.  Eliquis may be taken with or without food.   Try to take the dose about the same time in the morning and in the evening. If you have difficulty swallowing the tablet whole please discuss with your pharmacist how to take the medication safely.  Take Eliquis exactly as prescribed and DO NOT stop taking Eliquis without talking to the doctor who prescribed the medication.  Stopping may increase your risk of developing a new blood clot.  Refill your prescription before you run out.  After discharge, you should have regular check-up appointments with your healthcare provider that is prescribing your Eliquis.    What do you do if you miss a dose? If a dose of ELIQUIS is not taken at the scheduled time, take it as soon as possible on the same day and twice-daily administration should be resumed. The dose should not be doubled to make up for a missed dose.  Important Safety Information A possible side effect of Eliquis is bleeding. You should call your healthcare provider right away if you experience any of the following: ? Bleeding from an injury or your nose that does not stop. ? Unusual colored urine (red or dark brown) or unusual colored stools (red or black). ? Unusual bruising for unknown reasons. ? A serious fall or if you hit your head (even if there is no bleeding).  Some  medicines may interact with Eliquis and might increase your risk of bleeding or clotting while on Eliquis. To help avoid this, consult your healthcare provider or pharmacist prior to using any new prescription or non-prescription medications, including herbals, vitamins, non-steroidal anti-inflammatory drugs (NSAIDs) and supplements.  This website has more information on Eliquis (apixaban): http://[email or web address].com/eliquis/home

## 2019-11-24 NOTE — Plan of Care (Signed)
Pt seeks for help.

## 2019-11-24 NOTE — Progress Notes (Signed)
PROGRESS NOTE    Jacqueline Harris  ZOX:[id number] DOB: 02-20-1945 DOA: 11/21/2019 PCP: Glenda Chroman, MD   Chief Complaint  Patient presents with  . Cough   Brief Narrative: 74 year old female with history of prediabetes, iron deficiency anemia, COVID-19 infection in September 2021 presented with cough shortness of breath fever for past 3 days.  She did not require hospitalization for her recent Covid infection. As per report in the ED hypoxic 88% room air tachypneic, tachycardic with stable blood pressure EKG sinus tachycardia , CXR-demonstrates enlarged cardiac silhouette with slight vascular congestion and basilar infiltrates.  CTA chest is negative for PE but notable for moderate pericardial effusion and patchy airspace opacities concerning for multifocal pneumonia.  Labs with leukocytosis, microcytic anemia, slightly positive BNP 492.Blood cultures were collected in the ED, patient was treated with acetaminophen, Rocephin, and azithromycin, and admitted She is found to have DVT on the left leg and initiated on anticoagulation.  Subjective: Resting comfortably on room air at bedside chair.  Shortness of breath much better but did get dyspneic with activity.  Working with PT and pulse ox dropped to 86% on room air with gait and 91% at rest No fever, no leukocytosis. family at the bedside  Assessment & Plan:  Sepsis POA due to multifocal pneumonia: Sepsis parameters resolved.  Continue ceftriaxone/azithromycin, sputum culture with few gram-positive rods, rare gram-positive cocci, Legionella antigen in process.  Increase activity.  Wean off oxygen.  Acute respiratory failure with hypoxia due to pneumonia, no PE on CTA.  Echo shows RV dilation and dysfunction.  Able to cough oxygen at rest but did drop to 86% with activity. Will ambulate again tomorrow  Recent COVID-19 infection did not require hospitalization, COVID-19 negative on admission.    small Pericardial effusion by CT scan, echo  showed very small pleural pericardial effusion and cardiology input appreciated.  Right ventricular dilatation and dysfunction with severely elevated pulmonary artery pressure,there was a small pericardial effusion, CTA with no PE, ? Etiology-?pulmonary etiology/recent Covid infection, I wonder if she had recent PE given left leg acute DVT/left leg edema and edema has improved and it may be possible PE has cleared and not detected on current CTA.  She will need outpatient follow-up with pulmonary/cardiology.  Left leg edema echo shows acute DVT startied on Lovenox-we will switch to Eliquis.   Chronic Iron deficiency anemia: Hemoglobin 8 to 9 g. F/u anemia panel/low iron, and iron supplementation.  Monitor closely while on anticoagulation Recent Labs  Lab 11/21/19 1445 11/22/19 0507 11/23/19 0715 11/24/19 0200  HGB 9.2* 8.3* 8.3* 8.5*  HCT 30.9* 28.4* 27.1* 28.3*   Prediabetes stable hemoglobin A1c 6.1.  Dietary counseling  Abnormal thyroid function tsh 6.1-checked free T4 and is normal.  Nutrition: Diet Order            Diet Heart Room service appropriate? Yes; Fluid consistency: Thin  Diet effective now                 Body mass index is 25.24 kg/m.  DVT prophylaxis:  Code Status:   Code Status: Full Code  Family Communication: plan of care discussed with patient at bedside.called Maudie Mercury and updated. Spoke with patient's daughter Maudie Mercury at the bedside today.  Status is: Inpatient Remains inpatient appropriate because:Ongoing diagnostic testing needed not appropriate for outpatient work up, IV treatments appropriate due to intensity of illness or inability to take PO and Inpatient level of care appropriate due to severity of illness  Dispo: The patient is  from: Home              Anticipated d/c is to: Home, continue with PT OT, wean off oxygen.  Evaluate for home oxygen with ambulation              Anticipated d/c date is: 1 days              Patient currently is not medically  stable to d/c.   Consultants:see note  Procedures:see note  1.Left ventricular ejection fraction, by estimation, is 50 to 55%. The  left ventricle has low normal function. The left ventricle demonstrates  global hypokinesis. Left ventricular diastolic parameters are consistent  with Grade I diastolic dysfunction  (impaired relaxation).There is the interventricular septum is flattened  in systole and diastole, consistent with right ventricular pressure and  volume overload.  2.Right ventricular systolic function is moderately reduced. The right  ventricular size is moderately enlarged.There is severely elevated  pulmonary artery systolic pressure.The estimated right ventricular  systolic pressure is 46.2 mmHg.  3. A small pericardial effusion is present.The pericardial effusion is  posterior and lateral to the left ventricle. There is no evidence of  cardiac tamponade.  4. The mitral valve is normal in structure.No evidence of mitral valve  regurgitation. No evidence of mitral stenosis.  5. Tricuspid valve regurgitation is moderate.  6. The aortic valve is normal in structure.Aortic valve regurgitation is  not visualized. Mild to moderate aortic valve sclerosis/calcification is  present, without any evidence of aortic stenosis.  7. The inferior vena cava is normal in size with greater than 50%  respiratory variability, suggesting right atrial pressure of 3 mmHg.  Culture/Microbiology    Component Value Date/Time   SDES Expect. Sput 11/21/2019 2132   SDES Expect. Sput 11/21/2019 2132   SPECREQUEST NONE 11/21/2019 2132   SPECREQUEST NONE Reflexed from [id number] 11/21/2019 2132   CULT PENDING 11/21/2019 2132   REPTSTATUS 11/22/2019 FINAL 11/21/2019 2132   REPTSTATUS PENDING 11/21/2019 2132    Other culture-see note  Medications: Scheduled Meds: . enoxaparin (LOVENOX) injection  70 mg Subcutaneous Q12H  . feeding supplement  237 mL Oral BID BM  . influenza vaccine adjuvanted   0.5 mL Intramuscular Tomorrow-1000  . insulin aspart  0-5 Units Subcutaneous QHS  . insulin aspart  0-9 Units Subcutaneous TID WC  . sodium chloride flush  3 mL Intravenous Q12H  . sodium chloride flush  3 mL Intravenous Q12H   Continuous Infusions: . sodium chloride 50 mL/hr at 11/21/19 1730  . sodium chloride 250 mL (11/22/19 1738)  . azithromycin 500 mg (11/23/19 1759)  . cefTRIAXone (ROCEPHIN)  IV 2 g (11/23/19 1716)    Antimicrobials: Anti-infectives (From admission, onward)   Start     Dose/Rate Route Frequency Ordered Stop   11/22/19 1730  cefTRIAXone (ROCEPHIN) 2 g in sodium chloride 0.9 % 100 mL IVPB        2 g 200 mL/hr over 30 Minutes Intravenous Every 24 hours 11/21/19 2134 11/26/19 1729   11/22/19 1730  azithromycin (ZITHROMAX) 500 mg in sodium chloride 0.9 % 250 mL IVPB        500 mg 250 mL/hr over 60 Minutes Intravenous Every 24 hours 11/21/19 2134 11/26/19 1729   11/21/19 1645  cefTRIAXone (ROCEPHIN) 1 g in sodium chloride 0.9 % 100 mL IVPB        1 g 200 mL/hr over 30 Minutes Intravenous  Once 11/21/19 1631 11/21/19 1806   11/21/19 1645  azithromycin (ZITHROMAX) 500  mg in sodium chloride 0.9 % 250 mL IVPB        500 mg 250 mL/hr over 60 Minutes Intravenous  Once 11/21/19 1631 11/22/19 0159     Objective: Vitals: Today's Vitals   11/23/19 2002 11/23/19 2054 11/24/19 0512 11/24/19 0832  BP:  (!) 116/54 (!) 126/58   Pulse:  (!) 103 (!) 106 93  Resp:   17   Temp:   99.1 F (37.3 C)   TempSrc:   Oral   SpO2:  92% 92% 90%  Weight:      Height:      PainSc: 0-No pain       Intake/Output Summary (Last 24 hours) at 11/24/2019 1143 Last data filed at 11/24/2019 0500 Gross per 24 hour  Intake --  Output 400 ml  Net -400 ml   Filed Weights   11/21/19 1330 11/22/19 1215 11/23/19 0228  Weight: 63.5 kg 63.5 kg 66.7 kg   Weight change:   Intake/Output from previous day: 11/07 0701 - 11/08 0700 In: -  Out: 400 [Urine:400] Intake/Output this shift: No  intake/output data recorded.  Examination: General exam: AAOx3 , NAD, weak appearing. HEENT:Oral mucosa moist, Ear/Nose WNL grossly, dentition normal. Respiratory system: bilaterally basal crackles,no use of accessory muscle Cardiovascular system: S1 & S2 +, No JVD,. Gastrointestinal system: Abdomen soft, NT,ND, BS+ Nervous System:Alert, awake, moving extremities and grossly nonfocal Extremities: No edema, distal peripheral pulses palpable.  Skin: No rashes,no icterus. MSK: Normal muscle bulk,tone, power  Data Reviewed: I have personally reviewed following labs and imaging studies CBC: Recent Labs  Lab 11/21/19 1445 11/22/19 0507 11/23/19 0715 11/24/19 0200  WBC 13.6* 10.8* 9.2 8.2  NEUTROABS 10.7*  --   --   --   HGB 9.2* 8.3* 8.3* 8.5*  HCT 30.9* 28.4* 27.1* 28.3*  MCV 76.5* 77.8* 75.7* 76.5*  PLT 364 321 335 415   Basic Metabolic Panel: Recent Labs  Lab 11/21/19 1445 11/22/19 0507 11/23/19 0715 11/24/19 0200  NA 132* 137 136 135  K 4.2 4.3 4.3 4.4  CL 101 105 105 104  CO2 _0 GLUCOSE 214* 123* 161* 153*  BUN _1 CREATININE 0.86 0.86 0.85 0.80  CALCIUM 8.2* 8.4* 8.3* 8.3*   GFR: Estimated Creatinine Clearance: 59.7 mL/min (by C-G formula based on SCr of 0.8 mg/dL). Liver Function Tests: Recent Labs  Lab 11/21/19 1445  AST 25  ALT 11  ALKPHOS 58  BILITOT 0.7  PROT 7.3  ALBUMIN 2.8*   No results for input(s): LIPASE, AMYLASE in the last 168 hours. No results for input(s): AMMONIA in the last 168 hours. Coagulation Profile: No results for input(s): INR, PROTIME in the last 168 hours. Cardiac Enzymes: No results for input(s): CKTOTAL, CKMB, CKMBINDEX, TROPONINI in the last 168 hours. BNP (last 3 results) No results for input(s): PROBNP in the last 8760 hours. HbA1C: Recent Labs    11/22/19 0507  HGBA1C 6.1*   CBG: Recent Labs  Lab 11/23/19 0748 11/23/19 1156 11/23/19 1730 11/23/19 2055 11/24/19 0822  GLUCAP 126* 188* 121*  173* 146*   Lipid Profile: Recent Labs    11/23/19 0715  CHOL 123  HDL 14*  LDLCALC 90  TRIG 97  CHOLHDL 8.8   Thyroid Function Tests: Recent Labs    11/22/19 0507 11/23/19 0715  TSH 6.024*  --   FREET4  --  0.97   Anemia Panel: Recent Labs    11/22/19 1448 11/23/19 0715  VITAMINB12  --  940*  FOLATE  --  11.8  FERRITIN  --  43  TIBC 276 256  IRON 9* 10*  RETICCTPCT  --  1.6   Sepsis Labs: Recent Labs  Lab 11/21/19 1723 11/22/19 0507 11/23/19 0715  PROCALCITON  --  0.59 0.43  LATICACIDVEN 1.5  --   --     Recent Results (from the past 240 hour(s))  Respiratory Panel by RT PCR (Flu A&B, Covid) - Nasopharyngeal Swab     Status: None   Collection Time: 11/21/19  2:52 PM   Specimen: Nasopharyngeal Swab  Result Value Ref Range Status   SARS Coronavirus 2 by RT PCR NEGATIVE NEGATIVE Final    Comment: (NOTE) SARS-CoV-2 target nucleic acids are NOT DETECTED.  The SARS-CoV-2 RNA is generally detectable in upper respiratoy specimens during the acute phase of infection. The lowest concentration of SARS-CoV-2 viral copies this assay can detect is 131 copies/mL. A negative result does not preclude SARS-Cov-2 infection and should not be used as the sole basis for treatment or other patient management decisions. A negative result may occur with  improper specimen collection/handling, submission of specimen other than nasopharyngeal swab, presence of viral mutation(s) within the areas targeted by this assay, and inadequate number of viral copies (<131 copies/mL). A negative result must be combined with clinical observations, patient history, and epidemiological information. The expected result is Negative.  Fact Sheet for Patients:  PinkCheek.be  Fact Sheet for Healthcare Providers:  GravelBags.it  This test is no t yet approved or cleared by the [place] FDA and  has been authorized for detection  and/or diagnosis of SARS-CoV-2 by FDA under an Emergency Use Authorization (EUA). This EUA will remain  in effect (meaning this test can be used) for the duration of the COVID-19 declaration under Section 564(b)(1) of the Act, 21 U.S.C. section 360bbb-3(b)(1), unless the authorization is terminated or revoked sooner.     Influenza A by PCR NEGATIVE NEGATIVE Final   Influenza B by PCR NEGATIVE NEGATIVE Final    Comment: (NOTE) The Xpert Xpress SARS-CoV-2/FLU/RSV assay is intended as an aid in  the diagnosis of influenza from Nasopharyngeal swab specimens and  should not be used as a sole basis for treatment. Nasal washings and  aspirates are unacceptable for Xpert Xpress SARS-CoV-2/FLU/RSV  testing.  Fact Sheet for Patients: PinkCheek.be  Fact Sheet for Healthcare Providers: GravelBags.it  This test is not yet approved or cleared by the [place] FDA and  has been authorized for detection and/or diagnosis of SARS-CoV-2 by  FDA under an Emergency Use Authorization (EUA). This EUA will remain  in effect (meaning this test can be used) for the duration of the  Covid-19 declaration under Section 564(b)(1) of the Act, 21  U.S.C. section 360bbb-3(b)(1), unless the authorization is  terminated or revoked. Performed at [facility], [address] [place], [place] [address]   Blood culture (routine x 2)     Status: None (Preliminary result)   Collection Time: 11/21/19  5:25 PM   Specimen: BLOOD RIGHT HAND  Result Value Ref Range Status   Specimen Description   Final    BLOOD RIGHT HAND Performed at [facility], [address] [place], [place] [address]    Special Requests   Final    BOTTLES DRAWN AEROBIC AND ANAEROBIC Blood Culture adequate volume Performed at [facility], [address] [place], [place] [address]    Culture   Final  NO GROWTH 3 DAYS Performed at [facility], [place] [address] [place], [place] [address]    Report Status PENDING  Incomplete  Blood culture (routine x 2)     Status: None (Preliminary result)   Collection Time: 11/21/19  5:25 PM   Specimen: BLOOD LEFT FOREARM  Result Value Ref Range Status   Specimen Description   Final    BLOOD LEFT FOREARM Performed at [facility], [address] [place], [place] [address]    Special Requests   Final    BOTTLES DRAWN AEROBIC AND ANAEROBIC Blood Culture adequate volume Performed at [facility], [address] [place], [place] [address]    Culture   Final    NO GROWTH 3 DAYS Performed at [facility], [place] [address], [place], [place] [address]    Report Status PENDING  Incomplete  Sputum culture     Status: None   Collection Time: 11/21/19  9:32 PM   Specimen: Expectorated Sputum  Result Value Ref Range Status   Specimen Description Expect. Sput  Final   Special Requests NONE  Final   Sputum evaluation   Final    THIS SPECIMEN IS ACCEPTABLE FOR SPUTUM CULTURE Performed at [facility], [address], [place], [place] [address]    Report Status 11/22/2019 FINAL  Final  Culture, respiratory     Status: None (Preliminary result)   Collection Time: 11/21/19  9:32 PM  Result Value Ref Range Status   Specimen Description Expect. Sput  Final   Special Requests NONE Reflexed from [id number]  Final   Gram Stain   Final    RARE WBC PRESENT, PREDOMINANTLY PMN FEW GRAM POSITIVE RODS RARE GRAM POSITIVE COCCI IN CLUSTERS Performed at [facility], [place] [address], [place], [place] [address]    Culture PENDING  Incomplete   Report Status PENDING  Incomplete     Radiology Studies: DG Shoulder 1V Left  Result Date: 11/22/2019 CLINICAL DATA:  Anterior shoulder pain EXAM: LEFT SHOULDER COMPARISON:  None. FINDINGS: There is no evidence of fracture or dislocation. There is no evidence of arthropathy or other focal bone abnormality. Soft tissues are  unremarkable. IMPRESSION: Negative. Electronically Signed   By: Prudencio Pair M.D.   On: 11/22/2019 20:02   VAS [place] LOWER EXTREMITY VENOUS (DVT)  Result Date: 11/23/2019  Lower Venous DVT Study Indications: Pain, and Swelling.  Comparison Study: No prior study Performing Technologist: Sharion Dove RVS  Examination Guidelines: A complete evaluation includes B-mode imaging, spectral Doppler, color Doppler, and power Doppler as needed of all accessible portions of each vessel. Bilateral testing is considered an integral part of a complete examination. Limited examinations for reoccurring indications may be performed as noted. The reflux portion of the exam is performed with the patient in reverse Trendelenburg.  +---------+---------------+---------+-----------+----------+--------------+ RIGHT    CompressibilityPhasicitySpontaneityPropertiesThrombus Aging +---------+---------------+---------+-----------+----------+--------------+ CFV      Full           Yes      Yes                                 +---------+---------------+---------+-----------+----------+--------------+ SFJ      Full                                                        +---------+---------------+---------+-----------+----------+--------------+  FV Prox  Full                                                        +---------+---------------+---------+-----------+----------+--------------+ FV Mid   Full                                                        +---------+---------------+---------+-----------+----------+--------------+ FV DistalFull                                                        +---------+---------------+---------+-----------+----------+--------------+ PFV      Full                                                        +---------+---------------+---------+-----------+----------+--------------+ POP      Full           Yes      Yes                                  +---------+---------------+---------+-----------+----------+--------------+ PTV      Full                                                        +---------+---------------+---------+-----------+----------+--------------+ PERO     Full                                                        +---------+---------------+---------+-----------+----------+--------------+   +---------+---------------+---------+-----------+----------+--------------+ LEFT     CompressibilityPhasicitySpontaneityPropertiesThrombus Aging +---------+---------------+---------+-----------+----------+--------------+ CFV      Full           Yes      Yes                                 +---------+---------------+---------+-----------+----------+--------------+ SFJ      Full                                                        +---------+---------------+---------+-----------+----------+--------------+ FV Prox  Full                                                        +---------+---------------+---------+-----------+----------+--------------+  FV Mid   Full                                                        +---------+---------------+---------+-----------+----------+--------------+ FV DistalFull                                                        +---------+---------------+---------+-----------+----------+--------------+ PFV      Full                                                        +---------+---------------+---------+-----------+----------+--------------+ POP      Full           Yes      Yes                                 +---------+---------------+---------+-----------+----------+--------------+ PTV      Full                                                        +---------+---------------+---------+-----------+----------+--------------+ PERO     None                                         Acute           +---------+---------------+---------+-----------+----------+--------------+     Summary: RIGHT: - There is no evidence of deep vein thrombosis in the lower extremity.  LEFT: - Findings consistent with acute deep vein thrombosis involving the left peroneal veins.  *See table(s) above for measurements and observations. Electronically signed by Servando Snare MD on 11/23/2019 at 8:36:58 PM.    Final      LOS: 3 days   Antonieta Pert, MD [facility]  11/24/2019, 11:43 AM

## 2019-11-25 ENCOUNTER — Other Ambulatory Visit (HOSPITAL_COMMUNITY): Payer: Self-pay | Admitting: Internal Medicine

## 2019-11-25 DIAGNOSIS — E440 Moderate protein-calorie malnutrition: Secondary | ICD-10-CM | POA: Insufficient documentation

## 2019-11-25 LAB — BASIC METABOLIC PANEL
Anion gap: 10 (ref 5–15)
BUN: 10 mg/dL (ref 8–23)
CO2: 23 mmol/L (ref 22–32)
Calcium: 8.6 mg/dL — ABNORMAL LOW (ref 8.9–10.3)
Chloride: 103 mmol/L (ref 98–111)
Creatinine, Ser: 0.85 mg/dL (ref 0.44–1.00)
GFR, Estimated: >60 mL/min (ref >60)
Glucose, Bld: 173 mg/dL — ABNORMAL HIGH (ref 70–99)
Potassium: 4.2 mmol/L (ref 3.5–5.1)
Sodium: 136 mmol/L (ref 135–145)

## 2019-11-25 LAB — CBC
HCT: 30.4 % — ABNORMAL LOW (ref 36.0–46.0)
Hemoglobin: 9 g/dL — ABNORMAL LOW (ref 12.0–15.0)
MCH: 22.7 pg — ABNORMAL LOW (ref 26.0–34.0)
MCHC: 29.6 g/dL — ABNORMAL LOW (ref 30.0–36.0)
MCV: 76.6 fL — ABNORMAL LOW (ref 80.0–100.0)
Platelets: 443 10*3/uL — ABNORMAL HIGH (ref 150–400)
RBC: 3.97 MIL/uL (ref 3.87–5.11)
RDW: 17.6 % — ABNORMAL HIGH (ref 11.5–15.5)
WBC: 7.7 10*3/uL (ref 4.0–10.5)
nRBC: 0 % (ref 0.0–0.2)

## 2019-11-25 LAB — GLUCOSE, CAPILLARY: Glucose-Capillary: 144 mg/dL — ABNORMAL HIGH (ref 70–99)

## 2019-11-25 MED ORDER — APIXABAN 5 MG PO TABS: 10.0000 mg | ORAL_TABLET | Freq: Two times a day (BID) | ORAL | 0 refills | Status: DC

## 2019-11-25 MED ORDER — APIXABAN 5 MG PO TABS: 5.0000 mg | ORAL_TABLET | Freq: Two times a day (BID) | ORAL | 0 refills | Status: DC

## 2019-11-25 MED ORDER — CEFDINIR 300 MG PO CAPS: 300.0000 mg | ORAL_CAPSULE | Freq: Two times a day (BID) | ORAL | 0 refills | Status: DC

## 2019-11-25 MED ORDER — AZITHROMYCIN 500 MG PO TABS: 500.0000 mg | ORAL_TABLET | Freq: Every day | ORAL | 0 refills | Status: DC

## 2019-11-25 MED FILL — ELIQUIS STARTER PACK 5 MG T: 5 | 30 days supply | Qty: 74 | Fill #0

## 2019-11-25 MED FILL — AZITHROMYCIN 500 MG TABLET: 500 | 2 days supply | Qty: 2 | Fill #0

## 2019-11-25 MED FILL — CEFDINIR 300 MG CAPSULE: 300 | 3 days supply | Qty: 6 | Fill #0

## 2019-11-25 NOTE — TOC Progression Note (Signed)
Transition of Care [facility]) - Progression Note    Patient Details  Name: Jacqueline Harris MRN: [id number] Date of Birth: [birth year]  Transition of Care [facility]) CM/SW [place], RN Phone Number: [phone]  11/25/2019, 12:13 PM  Clinical Narrative:    [facility] consulted for DME and [facility] PT. Patient is refusing DME states that she will not use 3 in 1 because she can walk around her home and get to the bathroom without difficulty. Patient states that PT will not be helpful and that she is able to manage and has 2 grandchildren that live with her. TOC will sign off. Message sent to MD to make him aware.      Barriers to Discharge: No Barriers Identified  Expected Discharge Plan and Services           Expected Discharge Date: 11/25/19               DME Arranged: 3-N-1 DME Agency: [employer] Date DME Agency Contacted: 11/25/19 Time DME Agency Contacted: [phone]               Social Determinants of Health (SDOH) Interventions    Readmission Risk Interventions No flowsheet data found.

## 2019-11-25 NOTE — Care Management Important Message (Signed)
Important Message  Patient Details  Name: Jacqueline Harris MRN: [id number] Date of Birth: 08-24-1945   Medicare Important Message Given:  Yes     Orbie Pyo 11/25/2019, 8:46 AM

## 2019-11-25 NOTE — Discharge Summary (Addendum)
Physician Discharge Summary  AAMYA ORELLANA OMV:[id number] DOB: Nov 13, 1945 DOA: 11/21/2019  PCP: Glenda Chroman, MD  Admit date: 11/21/2019 Discharge date: 11/25/2019  Admitted From: home Disposition:  home  Recommendations for Outpatient Follow-up:  1. Follow up with PCP in 1-2 weeks 2. Please obtain BMP/CBC in one week 3. Please follow up on the following pending results:  Home Health:yes-refused  Equipment/Devices: none  Discharge Condition: Stable Code Status:   Code Status: Full Code Diet recommendation:  Diet Order            Diet - low sodium heart healthy           Diet regular Room service appropriate? Yes; Fluid consistency: Thin  Diet effective now                  Brief/Interim Summary:  74 year old female with history of prediabetes, iron deficiency anemia, COVID-19 infection in September 2021 presented with cough shortness of breath fever for past 3 days.  She did not require hospitalization for her recent Covid infection. As per report in the ED hypoxic 88% room air tachypneic, tachycardic with stable blood pressure EKG sinus tachycardia , CXR-demonstrates enlarged cardiac silhouette with slight vascular congestion and basilar infiltrates. CTA chest is negative for PE but notable for moderate pericardial effusion and patchy airspace opacities concerning for multifocal pneumonia.  Labs with leukocytosis, microcytic anemia, slightly positive BNP 492.Blood cultures were collected in the ED, patient was treated with acetaminophen, Rocephin, and azithromycin, and admitted She is found to have DVT on the left leg and initiated on anticoagulation. Patient slowly improved, at this time sepsis parameters resolved, culture data unremarkable. Sputum culture with gram-positive cocci in clusters and gram-positive rods, he has clinically improved ceftriaxone)-we will discharge on oral antibiotics to complete the course.  See tolerated Eliquis well hemoglobin which is stable and  will need to follow-up with her PCP regarding her anemia especially in the setting of anticoagulation Cardiology has signed off and will arrange outpatient follow-up. ambulated and did not drop o2 below 90.  Discharge Diagnoses:   Severe Sepsis POA due to multifocal pneumonia: she met severe sepsis parameters with tachycardia, leucocytosis, tachypnea and also Hypoxic ( Target organ damage). Severe sepsis parameters now resolved. Cont oral antibiotics to complete the course.  Acute respiratory failure with hypoxia due to pneumonia, no PE on CTA.  Echo shows RV dilation and dysfunction.Weaned off oxygen-checked ambulatory pulse ox prior to discharge and did not qualify.  Recent COVID-19 infection did not require hospitalization, COVID-19 negative on admission.    small Pericardial effusion by CT scan, echo showed very small pleural pericardial effusion and cardiology input appreciated.  Right ventricular dilatation and dysfunction with severely elevated pulmonary artery pressure,there was a small pericardial effusion, CTA with no PE, ? Etiology-?pulmonary etiology/recent Covid infection, I wonder if she had recent PE given left leg acute DVT/left leg edema and edema has improved and it may be possible PE has cleared and not detected on current CTA.  She will need outpatient follow-up with pulmonary/cardiology.  Left leg edema echo shows acute DVT startied on Lovenox now tolerating Eliquis.   Chronic Iron deficiency anemia: Hemoglobin 8 to 9 g. F/u anemia panel/low iron, and iron supplementation.  Monitor closely while on anticoagulation.  Prediabetes stable hemoglobin A1c 6.1.  Dietary counseling  Abnormal thyroid function tsh 6.1-checked free T4 and is normal.  Consults: Cardiology  Subjective: AAOx3, no chest pain or shortness of breath. On RA  Discharge Exam: Vitals:  11/25/19 0444 11/25/19 0925  BP: (!) 156/59 (!) 130/53  Pulse: (!) 111 97  Resp: 20 14  Temp: 98.4 F  (36.9 C) 98.5 F (36.9 C)  SpO2: 90% 94%   General: Pt is alert, awake, not in acute distress Cardiovascular: RRR, S1/S2 +, no rubs, no gallops Respiratory: CTA bilaterally, no wheezing, no rhonchi Abdominal: Soft, NT, ND, bowel sounds + Extremities: no edema, no cyanosis  Discharge Instructions  Discharge Instructions    Diet - low sodium heart healthy   Complete by: As directed    Discharge instructions   Complete by: As directed    Check CBC in 1 week, follow-up with cardiology pulmonary PCP  Please call call MD or return to ER for similar or worsening recurring problem that brought you to hospital or if any fever,nausea/vomiting,abdominal pain, uncontrolled pain, chest pain,  shortness of breath or any other alarming symptoms.  Please follow-up your doctor as instructed in a week time and call the office for appointment.  Please avoid alcohol, smoking, or any other illicit substance and maintain healthy habits including taking your regular medications as prescribed.  You were cared for by a hospitalist during your hospital stay. If you have any questions about your discharge medications or the care you received while you were in the hospital after you are discharged, you can call the unit and ask to speak with the hospitalist on call if the hospitalist that took care of you is not available.  Once you are discharged, your primary care physician will handle any further medical issues. Please note that NO REFILLS for any discharge medications will be authorized once you are discharged, as it is imperative that you return to your primary care physician (or establish a relationship with a primary care physician if you do not have one) for your aftercare needs so that they can reassess your need for medications and monitor your lab values   For home use only DME oxygen   Complete by: As directed    2l [place] while ambulating   Length of Need: Lifetime   Mode or (Route): Nasal cannula    Liters per Minute: 2   Frequency: Continuous (stationary and portable oxygen unit needed)   Oxygen delivery system: Gas   Increase activity slowly   Complete by: As directed      Allergies as of 11/25/2019   No Known Allergies     Medication List    TAKE these medications   apixaban 5 MG Tabs tablet Commonly known as: ELIQUIS Take 2 tablets (10 mg total) by mouth 2 (two) times daily for 11 doses.   apixaban 5 MG Tabs tablet Commonly known as: ELIQUIS Take 1 tablet (5 mg total) by mouth 2 (two) times daily. Start taking on: December 01, 2019   ascorbic acid 500 MG tablet Commonly known as: VITAMIN C Take 500-1,000 mg by mouth daily.   azithromycin 500 MG tablet Commonly known as: Zithromax Take 1 tablet (500 mg total) by mouth daily for 2 days.   cefdinir 300 MG capsule Commonly known as: OMNICEF Take 1 capsule (300 mg total) by mouth 2 (two) times daily for 3 days.   lisinopril 2.5 MG tablet Commonly known as: ZESTRIL Take 2.5 mg by mouth daily.   VITAMIN B-12 PO Take 1 tablet by mouth daily.   VITAMIN D-3 PO Take 1 capsule by mouth daily.   ZINC PO Take 1 tablet by mouth daily.  Durable Medical Equipment  (From admission, onward)         Start     Ordered   11/25/19 0935  DME 3-in-1  Once        11/25/19 0935   11/25/19 0000  For home use only DME oxygen       Comments: 2l [place] while ambulating  Question Answer Comment  Length of Need Lifetime   Mode or (Route) Nasal cannula   Liters per Minute 2   Frequency Continuous (stationary and portable oxygen unit needed)   Oxygen delivery system Gas      11/25/19 0935          Follow-up Information    Vyas, Dhruv B, MD Follow up in 1 week(s).   Specialty: Internal Medicine Contact information: [place]  [phone]        Dorothy Spark, MD .   Specialty: Cardiology Contact information: [place]  [phone]              No Known Allergies  The results of significant diagnostics from this hospitalization (including imaging, microbiology, ancillary and laboratory) are listed below for reference.    Microbiology: Recent Results (from the past 240 hour(s))  Respiratory Panel by RT PCR (Flu A&B, Covid) - Nasopharyngeal Swab     Status: None   Collection Time: 11/21/19  2:52 PM   Specimen: Nasopharyngeal Swab  Result Value Ref Range Status   SARS Coronavirus 2 by RT PCR NEGATIVE NEGATIVE Final    Comment: (NOTE) SARS-CoV-2 target nucleic acids are NOT DETECTED.  The SARS-CoV-2 RNA is generally detectable in upper respiratoy specimens during the acute phase of infection. The lowest concentration of SARS-CoV-2 viral copies this assay can detect is 131 copies/mL. A negative result does not preclude SARS-Cov-2 infection and should not be used as the sole basis for treatment or other patient management decisions. A negative result may occur with  improper specimen collection/handling, submission of specimen other than nasopharyngeal swab, presence of viral mutation(s) within the areas targeted by this assay, and inadequate number of viral copies (<131 copies/mL). A negative result must be combined with clinical observations, patient history, and epidemiological information. The expected result is Negative.  Fact Sheet for Patients:  PinkCheek.be  Fact Sheet for Healthcare Providers:  GravelBags.it  This test is no t yet approved or cleared by the [place] FDA and  has been authorized for detection and/or diagnosis of SARS-CoV-2 by FDA under an Emergency Use Authorization (EUA). This EUA will remain  in effect (meaning this test can be used) for the duration of the COVID-19 declaration under Section 564(b)(1) of the Act, 21 U.S.C. section 360bbb-3(b)(1), unless the authorization is terminated  or revoked sooner.     Influenza A by PCR NEGATIVE NEGATIVE Final   Influenza B by PCR NEGATIVE NEGATIVE Final    Comment: (NOTE) The Xpert Xpress SARS-CoV-2/FLU/RSV assay is intended as an aid in  the diagnosis of influenza from Nasopharyngeal swab specimens and  should not be used as a sole basis for treatment. Nasal washings and  aspirates are unacceptable for Xpert Xpress SARS-CoV-2/FLU/RSV  testing.  Fact Sheet for Patients: PinkCheek.be  Fact Sheet for Healthcare Providers: GravelBags.it  This test is not yet approved or cleared by the [place] FDA and  has been authorized for detection and/or diagnosis of SARS-CoV-2 by  FDA under an Emergency Use Authorization (EUA). This EUA will remain  in effect (meaning this test can be used) for the duration of the  Covid-19 declaration under Section 564(b)(1) of the Act, 21  U.S.C. section 360bbb-3(b)(1), unless the authorization is  terminated or revoked. Performed at [facility], [address] [place], [place] [address]   Blood culture (routine x 2)     Status: None (Preliminary result)   Collection Time: 11/21/19  5:25 PM   Specimen: BLOOD RIGHT HAND  Result Value Ref Range Status   Specimen Description   Final    BLOOD RIGHT HAND Performed at [facility], [address] [place], [place] [address]    Special Requests   Final    BOTTLES DRAWN AEROBIC AND ANAEROBIC Blood Culture adequate volume Performed at [facility], [address] [place], [place] [address]    Culture   Final    NO GROWTH 4 DAYS Performed at [facility], [place] [address], [place], [place] [address]    Report Status PENDING  Incomplete  Blood culture (routine x 2)     Status: None (Preliminary result)   Collection Time: 11/21/19  5:25 PM   Specimen: BLOOD LEFT FOREARM  Result Value Ref Range Status   Specimen Description   Final    BLOOD LEFT  FOREARM Performed at [facility], [address] [place], [place] [address]    Special Requests   Final    BOTTLES DRAWN AEROBIC AND ANAEROBIC Blood Culture adequate volume Performed at [facility], [address] [place], [place] [address]    Culture   Final    NO GROWTH 4 DAYS Performed at [facility], [place] [address] [place], [place] [address]    Report Status PENDING  Incomplete  Sputum culture     Status: None   Collection Time: 11/21/19  9:32 PM   Specimen: Expectorated Sputum  Result Value Ref Range Status   Specimen Description Expect. Sput  Final   Special Requests NONE  Final   Sputum evaluation   Final    THIS SPECIMEN IS ACCEPTABLE FOR SPUTUM CULTURE Performed at [facility], [address] [place], [place] [address]    Report Status 11/22/2019 FINAL  Final  Culture, respiratory     Status: None (Preliminary result)   Collection Time: 11/21/19  9:32 PM  Result Value Ref Range Status   Specimen Description Expect. Sput  Final   Special Requests NONE Reflexed from [id number]  Final   Gram Stain   Final    RARE WBC PRESENT, PREDOMINANTLY PMN FEW GRAM POSITIVE RODS RARE GRAM POSITIVE COCCI IN CLUSTERS Performed at [facility], [place] [address] [place], [place] [address]    Culture PENDING  Incomplete   Report Status PENDING  Incomplete    Procedures/Studies: DG Shoulder 1V Left  Result Date: 11/22/2019 CLINICAL DATA:  Anterior shoulder pain EXAM: LEFT SHOULDER COMPARISON:  None. FINDINGS: There is no evidence of fracture or dislocation. There is no evidence of arthropathy or other focal bone abnormality. Soft tissues are unremarkable. IMPRESSION: Negative. Electronically Signed   By: Prudencio Pair M.D.   On: 11/22/2019 20:02   CT Angio Chest PE W and/or Wo Contrast  Result Date: 11/21/2019 CLINICAL DATA:  Shortness of breath EXAM: CT ANGIOGRAPHY CHEST WITH CONTRAST TECHNIQUE: Multidetector CT imaging of the chest was  performed using the standard protocol during bolus administration of intravenous contrast. Multiplanar CT  image reconstructions and MIPs were obtained to evaluate the vascular anatomy. CONTRAST:  23m OMNIPAQUE IOHEXOL 350 MG/ML SOLN COMPARISON:  None. FINDINGS: Cardiovascular: There is a optimal opacification of the pulmonary arteries. There is no central,segmental, or subsegmental filling defects within the pulmonary arteries. There is mild cardiomegaly. A moderate pericardial effusion is seen. No evidence right heart strain. There is normal three-vessel brachiocephalic anatomy without proximal stenosis. Scattered aortic atherosclerosis is seen. There is coronary artery calcifications present. Mediastinum/Nodes: No hilar, mediastinal, or axillary adenopathy. Thyroid gland, trachea, and esophagus demonstrate no significant findings. Lungs/Pleura: Patchy airspace opacities are seen at the posterior bilateral lung bases. There is also a small amount of patchy airspace opacity at the posterior left upper lobe. There is a trace left pleural effusion present. Upper Abdomen: No acute abnormalities present in the visualized portions of the upper abdomen. Musculoskeletal: No chest wall abnormality. No acute or significant osseous findings. Review of the MIP images confirms the above findings. IMPRESSION: No central, segmental, or subsegmental pulmonary embolism. Moderate pericardial effusion with mild cardiomegaly. No evidence of right ventricular heart strain. Multifocal patchy airspace opacities, predominantly within both lung bases which may be due to infectious etiology. Trace left pleural effusion. Aortic Atherosclerosis (ICD10-I70.0). Electronically Signed   By: BPrudencio PairM.D.   On: 11/21/2019 17:10   DG Chest Portable 1 View  Result Date: 11/21/2019 CLINICAL DATA:  Cough, fever, shortness of breath, COVID-19 on 10/11/2019 EXAM: PORTABLE CHEST 1 VIEW COMPARISON:  Portable exam 1334 hours without priors  available for comparison FINDINGS: Enlargement of cardiac silhouette with slight pulmonary vascular congestion. Central pulmonary arterial enlargement at the LEFT hilum. Interstitial infiltrates at the mid to lower lungs greater at LEFT base laterally, could represent asymmetric edema over infection. No pleural effusion or pneumothorax. Bones demineralized. IMPRESSION: Enlargement of cardiac silhouette with slight vascular congestion. Bibasilar infiltrates slightly greater on LEFT, could represent asymmetric pulmonary edema or pneumonia. Electronically Signed   By: MLavonia DanaM.D.   On: 11/21/2019 13:58   ECHOCARDIOGRAM COMPLETE  Result Date: 11/22/2019    ECHOCARDIOGRAM REPORT   Patient Name:   MCHARLOTTIE PERAGINEDate of Exam: 11/22/2019 Medical Rec #:  [id number]     Height:       64.0 in Accession #:    [id number]    Weight:       140.0 lb Date of Birth:  [birth year]     BSA:          1.681 m Patient Age:    89years       BP:           114/55 mmHg Patient Gender: F              HR:           100 bpm. Exam Location:  Inpatient Procedure: 2D Echo, Cardiac Doppler and Color Doppler STAT ECHO Indications:    Pericardial effusion 423.9 / I31.3  History:        Patient has no prior history of Echocardiogram examinations.                 Risk Factors:Former Smoker.  Sonographer:    JVickie EpleyRDCS Referring Phys: [id number] 1. Left ventricular ejection fraction, by estimation, is 50 to 55%. The left ventricle has low normal function. The left ventricle demonstrates global hypokinesis. Left ventricular diastolic parameters are consistent with Grade I diastolic dysfunction (impaired relaxation). There is the interventricular  septum is flattened in systole and diastole, consistent with right ventricular pressure and volume overload.  2. Right ventricular systolic function is moderately reduced. The right ventricular size is moderately enlarged. There is severely elevated pulmonary artery  systolic pressure. The estimated right ventricular systolic pressure is 71.0 mmHg.  3. A small pericardial effusion is present. The pericardial effusion is posterior and lateral to the left ventricle. There is no evidence of cardiac tamponade.  4. The mitral valve is normal in structure. No evidence of mitral valve regurgitation. No evidence of mitral stenosis.  5. Tricuspid valve regurgitation is moderate.  6. The aortic valve is normal in structure. Aortic valve regurgitation is not visualized. Mild to moderate aortic valve sclerosis/calcification is present, without any evidence of aortic stenosis.  7. The inferior vena cava is normal in size with greater than 50% respiratory variability, suggesting right atrial pressure of 3 mmHg. FINDINGS  Left Ventricle: Left ventricular ejection fraction, by estimation, is 50 to 55%. The left ventricle has low normal function. The left ventricle demonstrates global hypokinesis. The left ventricular internal cavity size was normal in size. There is no left ventricular hypertrophy. The interventricular septum is flattened in systole and diastole, consistent with right ventricular pressure and volume overload. Left ventricular diastolic parameters are consistent with Grade I diastolic dysfunction (impaired relaxation). Normal left ventricular filling pressure. Right Ventricle: The right ventricular size is moderately enlarged. No increase in right ventricular wall thickness. Right ventricular systolic function is moderately reduced. There is severely elevated pulmonary artery systolic pressure. The tricuspid regurgitant velocity is 4.02 m/s, and with an assumed right atrial pressure of 3 mmHg, the estimated right ventricular systolic pressure is 62.6 mmHg. Left Atrium: Left atrial size was normal in size. Right Atrium: Right atrial size was normal in size. Pericardium: A small pericardial effusion is present. The pericardial effusion is posterior and lateral to the left  ventricle. There is no evidence of cardiac tamponade. Mitral Valve: The mitral valve is normal in structure. Mild mitral annular calcification. No evidence of mitral valve regurgitation. No evidence of mitral valve stenosis. Tricuspid Valve: The tricuspid valve is normal in structure. Tricuspid valve regurgitation is moderate . No evidence of tricuspid stenosis. Aortic Valve: The aortic valve is normal in structure. Aortic valve regurgitation is not visualized. Mild to moderate aortic valve sclerosis/calcification is present, without any evidence of aortic stenosis. Pulmonic Valve: The pulmonic valve was normal in structure. Pulmonic valve regurgitation is not visualized. No evidence of pulmonic stenosis. Aorta: The aortic root is normal in size and structure. Venous: The inferior vena cava is normal in size with greater than 50% respiratory variability, suggesting right atrial pressure of 3 mmHg. IAS/Shunts: No atrial level shunt detected by color flow Doppler.  LEFT VENTRICLE PLAX 2D LVOT diam:     1.70 cm     Diastology LV SV:         35          LV e' medial:    5.98 cm/s LV SV Index:   21          LV E/e' medial:  8.9 LVOT Area:     2.27 cm    LV e' lateral:   8.38 cm/s                            LV E/e' lateral: 6.3  LV Volumes (MOD) LV vol d, MOD A2C: 58.0 ml LV vol d, MOD A4C:  67.9 ml LV vol s, MOD A2C: 37.4 ml LV vol s, MOD A4C: 32.8 ml LV SV MOD A2C:     20.6 ml LV SV MOD A4C:     67.9 ml LV SV MOD BP:      27.7 ml RIGHT VENTRICLE RV S prime:     7.29 cm/s TAPSE (M-mode): 1.3 cm LEFT ATRIUM             Index       RIGHT ATRIUM           Index LA Vol (A2C):   28.5 ml 16.95 ml/m RA Area:     15.30 cm LA Vol (A4C):   28.0 ml 16.65 ml/m RA Volume:   40.50 ml  24.09 ml/m LA Biplane Vol: 29.7 ml 17.67 ml/m  AORTIC VALVE LVOT Vmax:   81.60 cm/s LVOT Vmean:  56.500 cm/s LVOT VTI:    0.155 m  AORTA Ao Root diam: 3.30 cm MITRAL VALVE               TRICUSPID VALVE MV Area (PHT): 9.14 cm    TR Peak grad:   64.6  mmHg MV Decel Time: 83 msec     TR Vmax:        402.00 cm/s MV E velocity: 53.10 cm/s MV A velocity: 87.80 cm/s  SHUNTS MV E/A ratio:  0.60        Systemic VTI:  0.16 m                            Systemic Diam: 1.70 cm Ena Dawley MD Electronically signed by Ena Dawley MD Signature Date/Time: 11/22/2019/9:35:59 AM    Final    VAS [place] LOWER EXTREMITY VENOUS (DVT)  Result Date: 11/23/2019  Lower Venous DVT Study Indications: Pain, and Swelling.  Comparison Study: No prior study Performing Technologist: Sharion Dove RVS  Examination Guidelines: A complete evaluation includes B-mode imaging, spectral Doppler, color Doppler, and power Doppler as needed of all accessible portions of each vessel. Bilateral testing is considered an integral part of a complete examination. Limited examinations for reoccurring indications may be performed as noted. The reflux portion of the exam is performed with the patient in reverse Trendelenburg.  +---------+---------------+---------+-----------+----------+--------------+  RIGHT     Compressibility Phasicity Spontaneity Properties Thrombus Aging  +---------+---------------+---------+-----------+----------+--------------+  CFV       Full            Yes       Yes                                    +---------+---------------+---------+-----------+----------+--------------+  SFJ       Full                                                             +---------+---------------+---------+-----------+----------+--------------+  FV Prox   Full                                                             +---------+---------------+---------+-----------+----------+--------------+  FV Mid    Full                                                             +---------+---------------+---------+-----------+----------+--------------+  FV Distal Full                                                             +---------+---------------+---------+-----------+----------+--------------+  PFV        Full                                                             +---------+---------------+---------+-----------+----------+--------------+  POP       Full            Yes       Yes                                    +---------+---------------+---------+-----------+----------+--------------+  PTV       Full                                                             +---------+---------------+---------+-----------+----------+--------------+  PERO      Full                                                             +---------+---------------+---------+-----------+----------+--------------+   +---------+---------------+---------+-----------+----------+--------------+  LEFT      Compressibility Phasicity Spontaneity Properties Thrombus Aging  +---------+---------------+---------+-----------+----------+--------------+  CFV       Full            Yes       Yes                                    +---------+---------------+---------+-----------+----------+--------------+  SFJ       Full                                                             +---------+---------------+---------+-----------+----------+--------------+  FV Prox   Full                                                             +---------+---------------+---------+-----------+----------+--------------+  FV Mid    Full                                                             +---------+---------------+---------+-----------+----------+--------------+  FV Distal Full                                                             +---------+---------------+---------+-----------+----------+--------------+  PFV       Full                                                             +---------+---------------+---------+-----------+----------+--------------+  POP       Full            Yes       Yes                                    +---------+---------------+---------+-----------+----------+--------------+  PTV       Full                                                              +---------+---------------+---------+-----------+----------+--------------+  PERO      None                                             Acute           +---------+---------------+---------+-----------+----------+--------------+     Summary: RIGHT: - There is no evidence of deep vein thrombosis in the lower extremity.  LEFT: - Findings consistent with acute deep vein thrombosis involving the left peroneal veins.  *See table(s) above for measurements and observations. Electronically signed by Servando Snare MD on 11/23/2019 at 8:36:58 PM.    Final     Labs: BNP (last 3 results) Recent Labs    11/21/19 1458  BNP 638.9*   Basic Metabolic Panel: Recent Labs  Lab 11/21/19 1445 11/22/19 0507 11/23/19 0715 11/24/19 0200 11/25/19 0450  NA 132* 137 136 135 136  K 4.2 4.3 4.3 4.4 4.2  CL 101 105 105 104 103  CO2 _0 GLUCOSE 214* 123* 161* 153* 173*  BUN _1 CREATININE 0.86 0.86 0.85 0.80 0.85  CALCIUM 8.2* 8.4* 8.3* 8.3* 8.6*   Liver Function Tests: Recent Labs  Lab 11/21/19 1445  AST 25  ALT 11  ALKPHOS 58  BILITOT 0.7  PROT 7.3  ALBUMIN 2.8*   No results for input(s): LIPASE, AMYLASE in the last 168 hours. No results for input(s): AMMONIA in  the last 168 hours. CBC: Recent Labs  Lab 11/21/19 1445 11/22/19 0507 11/23/19 0715 11/24/19 0200 11/25/19 0450  WBC 13.6* 10.8* 9.2 8.2 7.7  NEUTROABS 10.7*  --   --   --   --   HGB 9.2* 8.3* 8.3* 8.5* 9.0*  HCT 30.9* 28.4* 27.1* 28.3* 30.4*  MCV 76.5* 77.8* 75.7* 76.5* 76.6*  PLT 364 321 335 390 443*   Cardiac Enzymes: No results for input(s): CKTOTAL, CKMB, CKMBINDEX, TROPONINI in the last 168 hours. BNP: Invalid input(s): POCBNP CBG: Recent Labs  Lab 11/24/19 0822 11/24/19 1239 11/24/19 1639 11/24/19 2110 11/25/19 0922  GLUCAP 146* 202* 111* 206* 144*   D-Dimer No results for input(s): DDIMER in the last 72 hours. Hgb A1c No results for input(s): HGBA1C in the last 72  hours. Lipid Profile Recent Labs    11/23/19 0715  CHOL 123  HDL 14*  LDLCALC 90  TRIG 97  CHOLHDL 8.8   Thyroid function studies No results for input(s): TSH, T4TOTAL, T3FREE, THYROIDAB in the last 72 hours.  Invalid input(s): FREET3 Anemia work up Recent Labs    11/22/19 1448 11/23/19 0715  VITAMINB12  --  940*  FOLATE  --  11.8  FERRITIN  --  43  TIBC 276 256  IRON 9* 10*  RETICCTPCT  --  1.6   Urinalysis    Component Value Date/Time   COLORURINE YELLOW 11/21/2019 1900   APPEARANCEUR CLEAR 11/21/2019 1900   LABSPEC 1.010 11/21/2019 1900   PHURINE 5.5 11/21/2019 1900   GLUCOSEU NEGATIVE 11/21/2019 1900   HGBUR NEGATIVE 11/21/2019 1900   BILIRUBINUR NEGATIVE 11/21/2019 1900   KETONESUR NEGATIVE 11/21/2019 1900   PROTEINUR NEGATIVE 11/21/2019 1900   NITRITE NEGATIVE 11/21/2019 1900   LEUKOCYTESUR NEGATIVE 11/21/2019 1900   Sepsis Labs Invalid input(s): PROCALCITONIN,  WBC,  LACTICIDVEN Microbiology Recent Results (from the past 240 hour(s))  Respiratory Panel by RT PCR (Flu A&B, Covid) - Nasopharyngeal Swab     Status: None   Collection Time: 11/21/19  2:52 PM   Specimen: Nasopharyngeal Swab  Result Value Ref Range Status   SARS Coronavirus 2 by RT PCR NEGATIVE NEGATIVE Final    Comment: (NOTE) SARS-CoV-2 target nucleic acids are NOT DETECTED.  The SARS-CoV-2 RNA is generally detectable in upper respiratoy specimens during the acute phase of infection. The lowest concentration of SARS-CoV-2 viral copies this assay can detect is 131 copies/mL. A negative result does not preclude SARS-Cov-2 infection and should not be used as the sole basis for treatment or other patient management decisions. A negative result may occur with  improper specimen collection/handling, submission of specimen other than nasopharyngeal swab, presence of viral mutation(s) within the areas targeted by this assay, and inadequate number of viral copies (<131 copies/mL). A negative  result must be combined with clinical observations, patient history, and epidemiological information. The expected result is Negative.  Fact Sheet for Patients:  PinkCheek.be  Fact Sheet for Healthcare Providers:  GravelBags.it  This test is no t yet approved or cleared by the [place] FDA and  has been authorized for detection and/or diagnosis of SARS-CoV-2 by FDA under an Emergency Use Authorization (EUA). This EUA will remain  in effect (meaning this test can be used) for the duration of the COVID-19 declaration under Section 564(b)(1) of the Act, 21 U.S.C. section 360bbb-3(b)(1), unless the authorization is terminated or revoked sooner.     Influenza A by PCR NEGATIVE NEGATIVE Final   Influenza B by PCR NEGATIVE NEGATIVE Final  Comment: (NOTE) The Xpert Xpress SARS-CoV-2/FLU/RSV assay is intended as an aid in  the diagnosis of influenza from Nasopharyngeal swab specimens and  should not be used as a sole basis for treatment. Nasal washings and  aspirates are unacceptable for Xpert Xpress SARS-CoV-2/FLU/RSV  testing.  Fact Sheet for Patients: PinkCheek.be  Fact Sheet for Healthcare Providers: GravelBags.it  This test is not yet approved or cleared by the [place] FDA and  has been authorized for detection and/or diagnosis of SARS-CoV-2 by  FDA under an Emergency Use Authorization (EUA). This EUA will remain  in effect (meaning this test can be used) for the duration of the  Covid-19 declaration under Section 564(b)(1) of the Act, 21  U.S.C. section 360bbb-3(b)(1), unless the authorization is  terminated or revoked. Performed at [facility], [address] [place], [place] [address]   Blood culture (routine x 2)     Status: None (Preliminary result)   Collection Time: 11/21/19  5:25 PM   Specimen: BLOOD RIGHT HAND  Result Value Ref  Range Status   Specimen Description   Final    BLOOD RIGHT HAND Performed at [facility], [address] [place], [place] [address]    Special Requests   Final    BOTTLES DRAWN AEROBIC AND ANAEROBIC Blood Culture adequate volume Performed at [facility], [address] [place], [place] [address]    Culture   Final    NO GROWTH 4 DAYS Performed at [facility], [place] [address], [place], [place] [address]    Report Status PENDING  Incomplete  Blood culture (routine x 2)     Status: None (Preliminary result)   Collection Time: 11/21/19  5:25 PM   Specimen: BLOOD LEFT FOREARM  Result Value Ref Range Status   Specimen Description   Final    BLOOD LEFT FOREARM Performed at [facility], [address] [place], [place] [address]    Special Requests   Final    BOTTLES DRAWN AEROBIC AND ANAEROBIC Blood Culture adequate volume Performed at [facility], [address] [place], [place] [address]    Culture   Final    NO GROWTH 4 DAYS Performed at [facility], [place] [address], [place], [place] [address]    Report Status PENDING  Incomplete  Sputum culture     Status: None   Collection Time: 11/21/19  9:32 PM   Specimen: Expectorated Sputum  Result Value Ref Range Status   Specimen Description Expect. Sput  Final   Special Requests NONE  Final   Sputum evaluation   Final    THIS SPECIMEN IS ACCEPTABLE FOR SPUTUM CULTURE Performed at [facility], [address] [place], [place] [address]    Report Status 11/22/2019 FINAL  Final  Culture, respiratory     Status: None (Preliminary result)   Collection Time: 11/21/19  9:32 PM  Result Value Ref Range Status   Specimen Description Expect. Sput  Final   Special Requests NONE Reflexed from [id number]  Final   Gram Stain   Final    RARE WBC PRESENT, PREDOMINANTLY PMN FEW GRAM POSITIVE RODS RARE GRAM POSITIVE COCCI IN CLUSTERS Performed at [facility], [place] [address], [place], [place] [address]    Culture PENDING  Incomplete   Report Status PENDING  Incomplete     Time coordinating discharge: 35 minutes SIGNED: Antonieta Pert,  MD  [facility] 11/25/2019, 12:48 PM  If 7PM-7AM, please contact night-coverage [email or web address]

## 2019-11-25 NOTE — Progress Notes (Signed)
Occupational Therapy Treatment Patient Details Name: Jacqueline Harris MRN: [id number] DOB: 05/21/1945 Today's Date: 11/25/2019    History of present illness Pt is a 74 y/o female presenting to the ED on 11/5 for cough, SOB, and fever. pt with PNA, sepsis and LLE DVT. Pt had COVID in September but recovered. Pt has a PMH of prediabetes, anemia, and right ventricular failure.   OT comments  Patient eager for dc home today. Completes ADLs with supervision, functional mobility with supervision. Continues to require cueing for PLB techniques during daily activities and mobility, but Spo2 maintained on RA.  Limited activity tolerance and reviewed energy conservation techniques and activity progression.  Will follow acutely. Updated dc plan to no OT after dc.    Follow Up Recommendations  No OT follow up;Supervision - Intermittent    Equipment Recommendations  3 in 1 bedside commode    Recommendations for Other Services      Precautions / Restrictions Precautions Precautions: Other (comment) Precaution Comments: watch sats Restrictions Weight Bearing Restrictions: No       Mobility Bed Mobility Overal bed mobility: Modified Independent                Transfers Overall transfer level: Needs assistance   Transfers: Sit to/from Stand Sit to Stand: Supervision         General transfer comment: safety    Balance Overall balance assessment: Mild deficits observed, not formally tested                                         ADL either performed or assessed with clinical judgement   ADL Overall ADL's : Needs assistance/impaired     Grooming: Supervision/safety;Standing;Oral care;Wash/dry face;Brushing hair Grooming Details (indicate cue type and reason): cueing for PLB techniques     Lower Body Bathing: Supervison/ safety;Sit to/from stand Lower Body Bathing Details (indicate cue type and reason): increased time to don socks         Toilet  Transfer: Supervision/safety;Ambulation Toilet Transfer Details (indicate cue type and reason): simulated in room         Functional mobility during ADLs: Supervision/safety General ADL Comments: reviewed energy conservation techniques and provided handout      Vision       Perception     Praxis      Cognition Arousal/Alertness: Awake/alert Behavior During Therapy: WFL for tasks assessed/performed Overall Cognitive Status: Within Functional Limits for tasks assessed                                          Exercises Exercises: Other exercises Other Exercises Other Exercises: IS during session for lung expansion when feeling SOB, reviewed use of pulse ox at home    Shoulder Instructions       General Comments SpO2 maintained on RA 95% at rest, decreased to 90% during activity with cueing for PLB     Pertinent Vitals/ Pain       Pain Assessment: No/denies pain  Home Living                                          Prior Functioning/Environment  Frequency  Min 2X/week        Progress Toward Goals  OT Goals(current goals can now be found in the care plan section)  Progress towards OT goals: Progressing toward goals  Acute Rehab OT Goals Patient Stated Goal: return home and to church OT Goal Formulation: With patient  Plan Frequency remains appropriate;Discharge plan needs to be updated    Co-evaluation                 AM-PAC OT "6 Clicks" Daily Activity     Outcome Measure   Help from another person eating meals?: None Help from another person taking care of personal grooming?: A Little Help from another person toileting, which includes using toliet, bedpan, or urinal?: A Little Help from another person bathing (including washing, rinsing, drying)?: A Little Help from another person to put on and taking off regular upper body clothing?: A Little Help from another person to put on and taking off  regular lower body clothing?: A Little 6 Click Score: 19    End of Session    OT Visit Diagnosis: Muscle weakness (generalized) (M62.81)   Activity Tolerance Patient tolerated treatment well   Patient Left in chair;with call bell/phone within reach;with chair alarm set   Nurse Communication Mobility status        Time: 7262-0355 OT Time Calculation (min): 21 min  Charges: OT General Charges $OT Visit: 1 Visit OT Treatments $Self Care/Home Management : 8-22 mins  Jolaine Artist, [place] Pager [phone] Office [phone]    Delight Stare 11/25/2019, 10:16 AM

## 2019-11-26 LAB — CULTURE, BLOOD (ROUTINE X 2)
Culture: NO GROWTH
Culture: NO GROWTH
Special Requests: ADEQUATE
Special Requests: ADEQUATE

## 2019-11-28 DIAGNOSIS — J189 Pneumonia, unspecified organism: Secondary | ICD-10-CM | POA: Diagnosis not present

## 2019-11-28 DIAGNOSIS — I82402 Acute embolism and thrombosis of unspecified deep veins of left lower extremity: Secondary | ICD-10-CM | POA: Diagnosis not present

## 2019-11-28 DIAGNOSIS — E1165 Type 2 diabetes mellitus with hyperglycemia: Secondary | ICD-10-CM | POA: Diagnosis not present

## 2019-11-28 DIAGNOSIS — Z299 Encounter for prophylactic measures, unspecified: Secondary | ICD-10-CM | POA: Diagnosis not present

## 2019-11-28 DIAGNOSIS — M353 Polymyalgia rheumatica: Secondary | ICD-10-CM | POA: Diagnosis not present

## 2019-11-29 LAB — CULTURE, RESPIRATORY W GRAM STAIN: Culture: NORMAL

## 2019-12-16 NOTE — Progress Notes (Signed)
Cardiology Office Note    Date:  12/17/2019   ID:  Meaghann, Choo [birth year], MRN [id number]  PCP:  Glenda Chroman, MD  Cardiologist: Ena Dawley, MD EPS: None  Chief Complaint  Patient presents with  . Hospitalization Follow-up    History of Present Illness:  Jacqueline Harris is a 74 y.o. female with no prior cardiac history who was seen in the hospital 11/22/2019 after Covid infection in September. she presented with cough fever shortness of breath and was found to have multifocal pneumonia and pericardial effusion.  There was no evidence of tamponade and plan was to repeat echo once pulmonary issues  Resolved.  CT was negative for PE.  She had coronary calcifications and aortic atherosclerosis on her chest CTA.  Recommend statin.  Cautious with aspirin as she has microcytic anemia that needs to be evaluated. Had DVT and is on Eliquis.  Patient comes in for post hospital f/u. Very weak. Breathing has improved, no dyspnea, chest pain, palpitations, dizziness or presyncope. Seeing PCP for blue toes this afternoon. Started in August.     Past Medical History:  Diagnosis Date  . Prediabetes     Past Surgical History:  Procedure Laterality Date  . CHOLECYSTECTOMY    . COLONOSCOPY N/A 12/03/2014   Procedure: COLONOSCOPY;  Surgeon: Rogene Houston, MD;  Location: [facility];  Service: Endoscopy;  Laterality: N/A;  830    Current Medications: Current Meds  Medication Sig  . apixaban (ELIQUIS) 5 MG TABS tablet Take 1 tablet (5 mg total) by mouth 2 (two) times daily.  Marland Kitchen ascorbic acid (VITAMIN C) 500 MG tablet Take 500-1,000 mg by mouth daily.  . Cholecalciferol (VITAMIN D-3 PO) Take 1 capsule by mouth daily.  . Cyanocobalamin (VITAMIN B-12 PO) Take 1 tablet by mouth daily.  . Multiple Vitamins-Minerals (ZINC PO) Take 1 tablet by mouth daily.  . [DISCONTINUED] lisinopril (ZESTRIL) 2.5 MG tablet Take 2.5 mg by mouth daily.     Allergies:   Patient has no known allergies.     Social History   Socioeconomic History  . Marital status: Divorced    Spouse name: Not on file  . Number of children: Not on file  . Years of education: Not on file  . Highest education level: Not on file  Occupational History  . Not on file  Tobacco Use  . Smoking status: Former Research scientist (life sciences)  . Smokeless tobacco: Never Used  Vaping Use  . Vaping Use: Never used  Substance and Sexual Activity  . Alcohol use: No  . Drug use: No  . Sexual activity: Not on file  Other Topics Concern  . Not on file  Social History Narrative  . Not on file   Social Determinants of Health   Financial Resource Strain:   . Difficulty of Paying Living Expenses: Not on file  Food Insecurity:   . Worried About Charity fundraiser in the Last Year: Not on file  . Ran Out of Food in the Last Year: Not on file  Transportation Needs:   . Lack of Transportation (Medical): Not on file  . Lack of Transportation (Non-Medical): Not on file  Physical Activity:   . Days of Exercise per Week: Not on file  . Minutes of Exercise per Session: Not on file  Stress:   . Feeling of Stress : Not on file  Social Connections:   . Frequency of Communication with Friends and Family: Not on file  .  Frequency of Social Gatherings with Friends and Family: Not on file  . Attends Religious Services: Not on file  . Active Member of Clubs or Organizations: Not on file  . Attends Archivist Meetings: Not on file  . Marital Status: Not on file     Family History:  The patient's family history includes Cancer in her father; Cancer - Colon in her brother; Cancer - Lung in her mother; Diabetes in her sister; Hypertension in her sister.   ROS:   Please see the history of present illness.    ROS All other systems reviewed and are negative.   PHYSICAL EXAM:   VS:  BP (!) 118/56   Pulse (!) 105   Ht 5' 4" (1.626 m)   Wt 135 lb 3.2 oz (61.3 kg)   SpO2 99%   BMI 23.21 kg/m   Physical Exam  WNU:UVOZ, in no acute  distress  Neck: no JVD, carotid bruits, or masses Cardiac:RRR; no murmurs, rubs, or gallops  Respiratory:  clear to auscultation bilaterally, normal work of breathing GI: soft, nontender, nondistended, + BS Ext:  Cyanosis of toes and cold, no clubbing, or edema, Good distal pulses bilaterally Neuro:  Alert and Oriented x 3 Psych: euthymic mood, full affect  Wt Readings from Last 3 Encounters:  12/17/19 135 lb 3.2 oz (61.3 kg)  11/25/19 146 lb 9.7 oz (66.5 kg)  12/03/14 155 lb (70.3 kg)      Studies/Labs Reviewed:   EKG:  EKG is  ordered today.  The ekg ordered today demonstrates sinus tachycardia with LAFB  Recent Labs: 11/21/2019: ALT 11; B Natriuretic Peptide 492.4 11/22/2019: TSH 6.024 11/25/2019: BUN 10; Creatinine, Ser 0.85; Hemoglobin 9.0; Platelets 443; Potassium 4.2; Sodium 136   Lipid Panel    Component Value Date/Time   CHOL 123 11/23/2019 0715   TRIG 97 11/23/2019 0715   HDL 14 (L) 11/23/2019 0715   CHOLHDL 8.8 11/23/2019 0715   VLDL 19 11/23/2019 0715   [place] 90 11/23/2019 0715    Additional studies/ records that were reviewed today include:  ECHOCARDIOGRAM COMPLETE 11/22/2019   1. Left ventricular ejection fraction, by estimation, is 50 to 55%. The  left ventricle has low normal function. The left ventricle demonstrates  global hypokinesis. Left ventricular diastolic parameters are consistent  with Grade I diastolic dysfunction  (impaired relaxation). There is the interventricular septum is flattened  in systole and diastole, consistent with right ventricular pressure and  volume overload.   2. Right ventricular systolic function is moderately reduced. The right  ventricular size is moderately enlarged. There is severely elevated  pulmonary artery systolic pressure. The estimated right ventricular  systolic pressure is 36.6 mmHg.   3. A small pericardial effusion is present. The pericardial effusion is  posterior and lateral to the left ventricle. There  is no evidence of  cardiac tamponade.   4. The mitral valve is normal in structure. No evidence of mitral valve  regurgitation. No evidence of mitral stenosis.   5. Tricuspid valve regurgitation is moderate.          ASSESSMENT:    1. Pericardial effusion   2. RVF (right ventricular failure) ([place])   3. Coronary artery calcification seen on CT scan   4. Deep vein thrombosis (DVT) of lower extremity, unspecified chronicity, unspecified laterality, unspecified vein (HCC)      PLAN:  In order of problems listed above:  Small pericardial effusion without evidence of tamponade on echo post Covid infection.  Will repeat echo in January  RV dilatation and dysfunction most likely secondary to pulmonary etiology reevaluate with echo  Coronary calcifications and aortic atherosclerosis on chest CTA recommend statin.  Hold off on aspirin because of microcytic anemia. Will start low dose lipitor 10 mg once daily. Didn't tolerate a statin in the past but can't remember which one. LDL was 90.  DVT on Eliquis. Hgb 9.0, cyanotic toes but good DP pulses bilaterally. To be evaluated by PCP today.     Medication Adjustments/Labs and Tests Ordered: Current medicines are reviewed at length with the patient today.  Concerns regarding medicines are outlined above.  Medication changes, Labs and Tests ordered today are listed in the Patient Instructions below. There are no Patient Instructions on file for this visit.   Signed, Ermalinda Barrios, PA-C  12/17/2019 12:22 PM    [facility] [address], [place], [place]  [address] Phone: [phone]; Fax: [phone]

## 2019-12-17 ENCOUNTER — Ambulatory Visit: Payer: Medicare HMO | Admitting: Physician Assistant

## 2019-12-17 ENCOUNTER — Encounter: Payer: Self-pay | Admitting: Physician Assistant

## 2019-12-17 ENCOUNTER — Other Ambulatory Visit: Payer: Self-pay

## 2019-12-17 VITALS — BP 118/56 | HR 105 | Ht 64.0 in | Wt 135.2 lb

## 2019-12-17 DIAGNOSIS — Z0000 Encounter for general adult medical examination without abnormal findings: Secondary | ICD-10-CM | POA: Diagnosis not present

## 2019-12-17 DIAGNOSIS — Z79899 Other long term (current) drug therapy: Secondary | ICD-10-CM | POA: Diagnosis not present

## 2019-12-17 DIAGNOSIS — I50810 Right heart failure, unspecified: Secondary | ICD-10-CM

## 2019-12-17 DIAGNOSIS — I313 Pericardial effusion (noninflammatory): Secondary | ICD-10-CM

## 2019-12-17 DIAGNOSIS — E7800 Pure hypercholesterolemia, unspecified: Secondary | ICD-10-CM | POA: Diagnosis not present

## 2019-12-17 DIAGNOSIS — R5383 Other fatigue: Secondary | ICD-10-CM | POA: Diagnosis not present

## 2019-12-17 DIAGNOSIS — Z1339 Encounter for screening examination for other mental health and behavioral disorders: Secondary | ICD-10-CM | POA: Diagnosis not present

## 2019-12-17 DIAGNOSIS — Z1331 Encounter for screening for depression: Secondary | ICD-10-CM | POA: Diagnosis not present

## 2019-12-17 DIAGNOSIS — Z7189 Other specified counseling: Secondary | ICD-10-CM | POA: Diagnosis not present

## 2019-12-17 DIAGNOSIS — I3139 Other pericardial effusion (noninflammatory): Secondary | ICD-10-CM

## 2019-12-17 DIAGNOSIS — I2510 Atherosclerotic heart disease of native coronary artery without angina pectoris: Secondary | ICD-10-CM | POA: Diagnosis not present

## 2019-12-17 DIAGNOSIS — I82409 Acute embolism and thrombosis of unspecified deep veins of unspecified lower extremity: Secondary | ICD-10-CM

## 2019-12-17 DIAGNOSIS — Z6823 Body mass index (BMI) 23.0-23.9, adult: Secondary | ICD-10-CM | POA: Diagnosis not present

## 2019-12-17 DIAGNOSIS — Z299 Encounter for prophylactic measures, unspecified: Secondary | ICD-10-CM | POA: Diagnosis not present

## 2019-12-17 MED ORDER — ATORVASTATIN CALCIUM 10 MG PO TABS: 10.0000 mg | ORAL_TABLET | Freq: Every day | ORAL | 3 refills | Status: DC

## 2019-12-17 MED ORDER — METOPROLOL SUCCINATE ER 25 MG PO TB24: 25.0000 mg | ORAL_TABLET | Freq: Every day | ORAL | 3 refills | Status: DC

## 2019-12-17 NOTE — Patient Instructions (Signed)
Medication Instructions:  Your physician has recommended you make the following change in your medication:   STOP: Lisinopril START: Metoprolol Succinate 32m daily START: Atorvastatin 171mdaily  *If you need a refill on your cardiac medications before your next appointment, please call your pharmacy*   Lab Work: Your physician recommends that you return for a FASTING lipid profile and LFT on 03/16/2020  If you have labs (blood work) drawn today and your tests are completely normal, you will receive your results only by: . Marland KitchenyChart Message (if you have MyChart) OR . A paper copy in the mail If you have any lab test that is abnormal or we need to change your treatment, we will call you to review the results.   Testing/Procedures: Your physician has requested that you have an echocardiogram. Echocardiography is a painless test that uses sound waves to create images of your heart. It provides your doctor with information about the size and shape of your heart and how well your heart's chambers and valves are working. This procedure takes approximately one hour. There are no restrictions for this procedure.   Follow-Up: [facility].  As part of our continuing mission to provide you with exceptional heart care, we have created designated Provider Care Teams.  These Care Teams include your primary Cardiologist (physician) and Advanced Practice Providers (APPs -  Physician Assistants and Nurse Practitioners) who all work together to provide you with the care you need, when you need it.  Your next appointment:   January 2022 same day as Echo  The format for your next appointment:   In Person  Provider:   You may see KaEna DawleyMD or one of the following Advanced Practice Providers on your designated Care Team:    DaMelina CopaPA-C  MiErmalinda BarriosPA-C

## 2020-01-13 DIAGNOSIS — I82402 Acute embolism and thrombosis of unspecified deep veins of left lower extremity: Secondary | ICD-10-CM | POA: Diagnosis not present

## 2020-01-13 DIAGNOSIS — E7800 Pure hypercholesterolemia, unspecified: Secondary | ICD-10-CM | POA: Diagnosis not present

## 2020-01-13 DIAGNOSIS — Z299 Encounter for prophylactic measures, unspecified: Secondary | ICD-10-CM | POA: Diagnosis not present

## 2020-01-13 DIAGNOSIS — I700 Atherosclerosis of aorta: Secondary | ICD-10-CM | POA: Diagnosis not present

## 2020-01-13 DIAGNOSIS — E1165 Type 2 diabetes mellitus with hyperglycemia: Secondary | ICD-10-CM | POA: Diagnosis not present

## 2020-01-15 DIAGNOSIS — Z1231 Encounter for screening mammogram for malignant neoplasm of breast: Secondary | ICD-10-CM | POA: Diagnosis not present

## 2020-02-04 ENCOUNTER — Ambulatory Visit: Payer: Medicare HMO | Admitting: Physician Assistant

## 2020-02-04 ENCOUNTER — Other Ambulatory Visit (HOSPITAL_COMMUNITY): Payer: Medicare HMO

## 2020-02-11 ENCOUNTER — Other Ambulatory Visit (INDEPENDENT_AMBULATORY_CARE_PROVIDER_SITE_OTHER): Payer: Self-pay

## 2020-02-11 ENCOUNTER — Encounter (INDEPENDENT_AMBULATORY_CARE_PROVIDER_SITE_OTHER): Payer: Self-pay

## 2020-02-11 ENCOUNTER — Telehealth (INDEPENDENT_AMBULATORY_CARE_PROVIDER_SITE_OTHER): Payer: Self-pay

## 2020-02-11 DIAGNOSIS — Z1211 Encounter for screening for malignant neoplasm of colon: Secondary | ICD-10-CM

## 2020-02-11 MED ORDER — NA SULFATE-K SULFATE-MG SULF 17.5-3.13-1.6 GM/177ML PO SOLN: 354.0000 mL | Freq: Once | ORAL | 0 refills | Status: AC

## 2020-02-11 NOTE — Telephone Encounter (Signed)
Referring MD/PCP: Woody Seller   Procedure: Tcs  Reason/Indication:  Hx of polyps, fam hx of colon ca  Has patient had this procedure before?  yes  If so, when, by whom and where?  2016  Is there a family history of colon cancer?  yes  Who?  What age when diagnosed?  Brother  Is patient diabetic?   Yes, doesn't take meds      Does patient have prosthetic heart valve or mechanical valve?  no  Do you have a pacemaker/defibrillator?  no  Has patient ever had endocarditis/atrial fibrillation? no  Have you had a stroke/heart attack last 6 mths? no  Does patient use oxygen? no  Has patient had joint replacement within last 12 months?  no  Is patient constipated or do they take laxatives? no  Does patient have a history of alcohol/drug use?  no  Is patient on blood thinner such as Coumadin, Plavix and/or Aspirin? yes  Medications: Eliquis 35m daily(vyas), Lisinopril 2.5 mg daily, atorvastatin 10 mg daily  Allergies: nkda  Medication Adjustment per Dr RLaural Golden Eliquis 2 days prior  Procedure date & time: 02/25/20     9:00

## 2020-02-11 NOTE — Telephone Encounter (Signed)
Chase Caller, CMA

## 2020-02-12 ENCOUNTER — Encounter (INDEPENDENT_AMBULATORY_CARE_PROVIDER_SITE_OTHER): Payer: Self-pay

## 2020-02-23 ENCOUNTER — Other Ambulatory Visit (HOSPITAL_COMMUNITY): Payer: Medicare HMO

## 2020-02-23 ENCOUNTER — Encounter (HOSPITAL_COMMUNITY): Admission: RE | Admit: 2020-02-23 | Payer: Medicare HMO | Source: Ambulatory Visit

## 2020-02-24 DIAGNOSIS — R7309 Other abnormal glucose: Secondary | ICD-10-CM | POA: Diagnosis not present

## 2020-02-24 DIAGNOSIS — I700 Atherosclerosis of aorta: Secondary | ICD-10-CM | POA: Diagnosis not present

## 2020-02-24 DIAGNOSIS — M353 Polymyalgia rheumatica: Secondary | ICD-10-CM | POA: Diagnosis not present

## 2020-02-24 DIAGNOSIS — Z299 Encounter for prophylactic measures, unspecified: Secondary | ICD-10-CM | POA: Diagnosis not present

## 2020-02-24 DIAGNOSIS — R000 Tachycardia, unspecified: Secondary | ICD-10-CM | POA: Diagnosis not present

## 2020-02-25 ENCOUNTER — Ambulatory Visit (HOSPITAL_COMMUNITY): Admit: 2020-02-25 | Payer: Medicare HMO | Admitting: Internal Medicine

## 2020-02-25 ENCOUNTER — Encounter (HOSPITAL_COMMUNITY): Payer: Self-pay

## 2020-02-25 SURGERY — COLONOSCOPY WITH PROPOFOL
Anesthesia: Monitor Anesthesia Care

## 2020-03-04 NOTE — Patient Instructions (Signed)
Jacqueline Harris  03/04/2020     _0 @   Your procedure is scheduled on  03/10/2020   Report to [facility] at  1200 P.M.   Call this number if you have problems the morning of surgery:  [phone]   Remember:  Follow the diet and prep instructions given to you by the office.                       Take these medicines the morning of surgery with A SIP OF WATER  Metoprolol.    Please brush your teeth.  Do not wear jewelry, make-up or nail polish.  Do not wear lotions, powders, or perfumes, or deodorant.  Do not shave 48 hours prior to surgery.  Men may shave face and neck.  Do not bring valuables to the hospital.  [facility] is not responsible for any belongings or valuables.  Contacts, dentures or bridgework may not be worn into surgery.  Leave your suitcase in the car.  After surgery it may be brought to your room.  For patients admitted to the hospital, discharge time will be determined by your treatment team.  Patients discharged the day of surgery will not be allowed to drive home and must have someone with them for 24 hours.   Special instructions:  DO NOT smoke tobacco or vape the morning of your procedure.   Please read over the following fact sheets that you were given. Anesthesia Post-op Instructions and Care and Recovery After Surgery       Colonoscopy, Adult, Care After This sheet gives you information about how to care for yourself after your procedure. Your health care provider may also give you more specific instructions. If you have problems or questions, contact your health care provider. What can I expect after the procedure? After the procedure, it is common to have:  A small amount of blood in your stool for 24 hours after the procedure.  Some gas.  Mild cramping or bloating of your abdomen. Follow these instructions at home: Eating and drinking  Drink enough fluid to keep your urine pale yellow.  Follow  instructions from your health care provider about eating or drinking restrictions.  Resume your normal diet as instructed by your health care provider. Avoid heavy or fried foods that are hard to digest.   Activity  Rest as told by your health care provider.  Avoid sitting for a long time without moving. Get up to take short walks every 1-2 hours. This is important to improve blood flow and breathing. Ask for help if you feel weak or unsteady.  Return to your normal activities as told by your health care provider. Ask your health care provider what activities are safe for you. Managing cramping and bloating  Try walking around when you have cramps or feel bloated.  Apply heat to your abdomen as told by your health care provider. Use the heat source that your health care provider recommends, such as a moist heat pack or a heating pad. ? Place a towel between your skin and the heat source. ? Leave the heat on for 20-30 minutes. ? Remove the heat if your skin turns bright red. This is especially important if you are unable to feel pain, heat, or cold. You may have a greater risk of getting burned.   General instructions  If you were given a sedative during the procedure, it can affect you for  several hours. Do not drive or operate machinery until your health care provider says that it is safe.  For the first 24 hours after the procedure: ? Do not sign important documents. ? Do not drink alcohol. ? Do your regular daily activities at a slower pace than normal. ? Eat soft foods that are easy to digest.  Take over-the-counter and prescription medicines only as told by your health care provider.  Keep all follow-up visits as told by your health care provider. This is important. Contact a health care provider if:  You have blood in your stool 2-3 days after the procedure. Get help right away if you have:  More than a small spotting of blood in your stool.  Large blood clots in your  stool.  Swelling of your abdomen.  Nausea or vomiting.  A fever.  Increasing pain in your abdomen that is not relieved with medicine. Summary  After the procedure, it is common to have a small amount of blood in your stool. You may also have mild cramping and bloating of your abdomen.  If you were given a sedative during the procedure, it can affect you for several hours. Do not drive or operate machinery until your health care provider says that it is safe.  Get help right away if you have a lot of blood in your stool, nausea or vomiting, a fever, or increased pain in your abdomen. This information is not intended to replace advice given to you by your health care provider. Make sure you discuss any questions you have with your health care provider. Document Revised: 12/27/2018 Document Reviewed: 07/29/2018 Elsevier Patient Education  2021 [place] After This sheet gives you information about how to care for yourself after your procedure. Your health care provider may also give you more specific instructions. If you have problems or questions, contact your health care provider. What can I expect after the procedure? After the procedure, it is common to have:  Tiredness.  Forgetfulness about what happened after the procedure.  Impaired judgment for important decisions.  Nausea or vomiting.  Some difficulty with balance. Follow these instructions at home: For the time period you were told by your health care provider:  Rest as needed.  Do not participate in activities where you could fall or become injured.  Do not drive or use machinery.  Do not drink alcohol.  Do not take sleeping pills or medicines that cause drowsiness.  Do not make important decisions or sign legal documents.  Do not take care of children on your own.      Eating and drinking  Follow the diet that is recommended by your health care provider.  Drink  enough fluid to keep your urine pale yellow.  If you vomit: ? Drink water, juice, or soup when you can drink without vomiting. ? Make sure you have little or no nausea before eating solid foods. General instructions  Have a responsible adult stay with you for the time you are told. It is important to have someone help care for you until you are awake and alert.  Take over-the-counter and prescription medicines only as told by your health care provider.  If you have sleep apnea, surgery and certain medicines can increase your risk for breathing problems. Follow instructions from your health care provider about wearing your sleep device: ? Anytime you are sleeping, including during daytime naps. ? While taking prescription pain medicines, sleeping medicines, or medicines that make  you drowsy.  Avoid smoking.  Keep all follow-up visits as told by your health care provider. This is important. Contact a health care provider if:  You keep feeling nauseous or you keep vomiting.  You feel light-headed.  You are still sleepy or having trouble with balance after 24 hours.  You develop a rash.  You have a fever.  You have redness or swelling around the IV site. Get help right away if:  You have trouble breathing.  You have new-onset confusion at home. Summary  For several hours after your procedure, you may feel tired. You may also be forgetful and have poor judgment.  Have a responsible adult stay with you for the time you are told. It is important to have someone help care for you until you are awake and alert.  Rest as told. Do not drive or operate machinery. Do not drink alcohol or take sleeping pills.  Get help right away if you have trouble breathing, or if you suddenly become confused. This information is not intended to replace advice given to you by your health care provider. Make sure you discuss any questions you have with your health care provider. Document Revised:  09/18/2019 Document Reviewed: 12/05/2018 Elsevier Patient Education  2021 [employer].

## 2020-03-08 ENCOUNTER — Other Ambulatory Visit: Payer: Self-pay

## 2020-03-08 ENCOUNTER — Telehealth (INDEPENDENT_AMBULATORY_CARE_PROVIDER_SITE_OTHER): Payer: Self-pay

## 2020-03-08 ENCOUNTER — Encounter (HOSPITAL_COMMUNITY)
Admission: RE | Admit: 2020-03-08 | Discharge: 2020-03-08 | Disposition: A | Discharge status: Home / Self Care | Payer: Medicare HMO | Source: Ambulatory Visit | Attending: Internal Medicine | Admitting: Internal Medicine

## 2020-03-08 ENCOUNTER — Encounter (HOSPITAL_COMMUNITY): Payer: Self-pay

## 2020-03-08 ENCOUNTER — Other Ambulatory Visit (HOSPITAL_COMMUNITY)
Admission: RE | Admit: 2020-03-08 | Discharge: 2020-03-08 | Disposition: A | Discharge status: Home / Self Care | Payer: Medicare HMO | Source: Ambulatory Visit | Attending: Internal Medicine | Admitting: Internal Medicine

## 2020-03-08 DIAGNOSIS — Z01812 Encounter for preprocedural laboratory examination: Secondary | ICD-10-CM | POA: Diagnosis present

## 2020-03-08 DIAGNOSIS — Z20822 Contact with and (suspected) exposure to covid-19: Secondary | ICD-10-CM | POA: Diagnosis not present

## 2020-03-08 DIAGNOSIS — Z1211 Encounter for screening for malignant neoplasm of colon: Secondary | ICD-10-CM

## 2020-03-08 HISTORY — DX: Essential (primary) hypertension: I10

## 2020-03-08 LAB — SARS CORONAVIRUS 2 (TAT 6-24 HRS): SARS Coronavirus 2: NEGATIVE

## 2020-03-08 NOTE — Telephone Encounter (Signed)
Patient with diagnosis of DVT on Eliquis for anticoagulation.    Procedure: colonoscopy Date of procedure: 03/10/20  Patient found to have a DVT on 11/23/19.  Patient is a little over 3 months out from DVT ? Provoked due to COVID?  Will defer to MD if he she is ok with a 2 day hold

## 2020-03-08 NOTE — Telephone Encounter (Signed)
   Primary Cardiologist: Ena Dawley, MD  Chart reviewed as part of pre-operative protocol coverage.   Colonoscopy is scheduled for 03/10/20 with request to hold clearance 2 days prior received at 03/08/20 11:27 AM.   Jacqueline Harris is a 75 year old female with history of COVID19 09/2019 requiring hospitalization complicated by multifocal pneumonia and pericardial effusion with no evidence of tamponade, CT chest negative for PE though did not coronary calcification and aortic atherosclerosis. She diagnosed with DVT and started on Eliquis.   Her Eliquis was initially filled by the hospitalist and since that time has been filled by her primary care provider Dr. Golden Pop.   As such, requests to hold anticoagulation should be sent to their office. Will route to requesting party so they are aware and remove from preop pool.   Loel Dubonnet, NP

## 2020-03-08 NOTE — Telephone Encounter (Signed)
Primary Cardiologist: Ena Dawley, MD  Chart reviewed as part of pre-operative protocol coverage. Miss Jacqueline Harris may hold Eliquis 2 days prior to planned procedure per her primary cardiologist.   Will route to requesting party.   Future requests regarding holding Eliquis should go to the prescribing provider. Will route to requesting party for their review.   Loel Dubonnet, NP 03/08/2020, 2:35 PM

## 2020-03-08 NOTE — Telephone Encounter (Signed)
Its ok to hold for two days, thank you, KN

## 2020-03-08 NOTE — Telephone Encounter (Signed)
Jacqueline Harris, CMA  

## 2020-03-08 NOTE — Telephone Encounter (Signed)
Noted thank you so much

## 2020-03-08 NOTE — Telephone Encounter (Signed)
   Request for surgical clearance:  1. What type of surgery is being performed? colonoscopy   2. When is this surgery scheduled? 03/10/20   3. Are there any medications that need to be held prior to surgery and how long?Eliquis 2 days prior   4. Name of physician performing surgery? Dr. Laural Golden   5. What is the office phone and fax number? [facility]. Phone [phone]. No fax listed.

## 2020-03-08 NOTE — Telephone Encounter (Signed)
Jacqueline Harris will need to Hold her Eliquis 2 days  prior to her Colonoscopy with Dr Laural Golden is this ok to do, Please advise? Thank you!!

## 2020-03-10 ENCOUNTER — Encounter (HOSPITAL_COMMUNITY): Payer: Self-pay | Admitting: Internal Medicine

## 2020-03-10 ENCOUNTER — Encounter (HOSPITAL_COMMUNITY)
Admission: RE | Disposition: A | Discharge status: Home / Self Care | Payer: Self-pay | Source: Ambulatory Visit | Attending: Internal Medicine

## 2020-03-10 ENCOUNTER — Ambulatory Visit (HOSPITAL_COMMUNITY)
Admission: RE | Admit: 2020-03-10 | Discharge: 2020-03-10 | Disposition: A | Discharge status: Home / Self Care | Payer: Medicare HMO | Source: Ambulatory Visit | Attending: Internal Medicine | Admitting: Internal Medicine

## 2020-03-10 ENCOUNTER — Other Ambulatory Visit: Payer: Self-pay

## 2020-03-10 ENCOUNTER — Ambulatory Visit (HOSPITAL_COMMUNITY): Payer: Medicare HMO | Admitting: Anesthesiology

## 2020-03-10 DIAGNOSIS — Z800 Family history of malignant neoplasm of digestive organs: Secondary | ICD-10-CM | POA: Insufficient documentation

## 2020-03-10 DIAGNOSIS — Z833 Family history of diabetes mellitus: Secondary | ICD-10-CM | POA: Insufficient documentation

## 2020-03-10 DIAGNOSIS — Z8249 Family history of ischemic heart disease and other diseases of the circulatory system: Secondary | ICD-10-CM | POA: Diagnosis not present

## 2020-03-10 DIAGNOSIS — Z1211 Encounter for screening for malignant neoplasm of colon: Secondary | ICD-10-CM | POA: Insufficient documentation

## 2020-03-10 DIAGNOSIS — Z86718 Personal history of other venous thrombosis and embolism: Secondary | ICD-10-CM | POA: Diagnosis not present

## 2020-03-10 DIAGNOSIS — Z801 Family history of malignant neoplasm of trachea, bronchus and lung: Secondary | ICD-10-CM | POA: Diagnosis not present

## 2020-03-10 DIAGNOSIS — Z888 Allergy status to other drugs, medicaments and biological substances status: Secondary | ICD-10-CM | POA: Insufficient documentation

## 2020-03-10 DIAGNOSIS — I2510 Atherosclerotic heart disease of native coronary artery without angina pectoris: Secondary | ICD-10-CM | POA: Diagnosis not present

## 2020-03-10 DIAGNOSIS — R7303 Prediabetes: Secondary | ICD-10-CM | POA: Insufficient documentation

## 2020-03-10 DIAGNOSIS — Z8371 Family history of colonic polyps: Secondary | ICD-10-CM | POA: Insufficient documentation

## 2020-03-10 DIAGNOSIS — K644 Residual hemorrhoidal skin tags: Secondary | ICD-10-CM | POA: Insufficient documentation

## 2020-03-10 DIAGNOSIS — D120 Benign neoplasm of cecum: Secondary | ICD-10-CM | POA: Insufficient documentation

## 2020-03-10 DIAGNOSIS — Z79899 Other long term (current) drug therapy: Secondary | ICD-10-CM | POA: Diagnosis not present

## 2020-03-10 DIAGNOSIS — Z09 Encounter for follow-up examination after completed treatment for conditions other than malignant neoplasm: Secondary | ICD-10-CM | POA: Diagnosis not present

## 2020-03-10 DIAGNOSIS — Z7901 Long term (current) use of anticoagulants: Secondary | ICD-10-CM | POA: Diagnosis not present

## 2020-03-10 DIAGNOSIS — Z86010 Personal history of colonic polyps: Secondary | ICD-10-CM | POA: Diagnosis not present

## 2020-03-10 DIAGNOSIS — Z87891 Personal history of nicotine dependence: Secondary | ICD-10-CM | POA: Diagnosis not present

## 2020-03-10 DIAGNOSIS — K635 Polyp of colon: Secondary | ICD-10-CM | POA: Diagnosis not present

## 2020-03-10 HISTORY — PX: COLONOSCOPY WITH PROPOFOL: SHX5780

## 2020-03-10 HISTORY — PX: POLYPECTOMY: SHX5525

## 2020-03-10 LAB — HM COLONOSCOPY

## 2020-03-10 SURGERY — COLONOSCOPY WITH PROPOFOL
Anesthesia: General

## 2020-03-10 MED ORDER — PROPOFOL 500 MG/50ML IV EMUL
INTRAVENOUS | Status: DC | PRN
  Administered 2020-03-10 (×2): 100 ug/kg/min via INTRAVENOUS

## 2020-03-10 MED ORDER — STERILE WATER FOR IRRIGATION IR SOLN
Status: DC | PRN
  Administered 2020-03-10: 1.5 mL

## 2020-03-10 MED ORDER — PROPOFOL 10 MG/ML IV BOLUS
INTRAVENOUS | Status: DC | PRN
  Administered 2020-03-10: 100 mg via INTRAVENOUS
  Administered 2020-03-10: 30 mg via INTRAVENOUS

## 2020-03-10 MED ORDER — LACTATED RINGERS IV SOLN: INTRAVENOUS | Status: DC | PRN

## 2020-03-10 NOTE — Discharge Instructions (Signed)
Resume Eliquis on 03/11/2020 Resume other medications and diet as before No driving for 24 hours Physician will call with biopsy results.   Colonoscopy, Adult, Care After This sheet gives you information about how to care for yourself after your procedure. Your doctor may also give you more specific instructions. If you have problems or questions, call your doctor. What can I expect after the procedure? After the procedure, it is common to have:  A small amount of blood in your poop (stool) for 24 hours.  Some gas.  Mild cramping or bloating in your belly (abdomen). Follow these instructions at home: Eating and drinking  Drink enough fluid to keep your pee (urine) pale yellow.  Follow instructions from your doctor about what you cannot eat or drink.  Return to your normal diet as told by your doctor. Avoid heavy or fried foods that are hard to digest.   Activity  Rest as told by your doctor.  Do not sit for a long time without moving. Get up to take short walks every 1-2 hours. This is important. Ask for help if you feel weak or unsteady.  Return to your normal activities as told by your doctor. Ask your doctor what activities are safe for you. To help cramping and bloating:  Try walking around.  Put heat on your belly as told by your doctor. Use the heat source that your doctor recommends, such as a moist heat pack or a heating pad. ? Put a towel between your skin and the heat source. ? Leave the heat on for 20-30 minutes. ? Remove the heat if your skin turns bright red. This is very important if you are unable to feel pain, heat, or cold. You may have a greater risk of getting burned.   General instructions  If you were given a medicine to help you relax (sedative) during your procedure, it can affect you for many hours. Do not drive or use machinery until your doctor says that it is safe.  For the first 24 hours after the procedure: ? Do not sign important  documents. ? Do not drink alcohol. ? Do your daily activities more slowly than normal. ? Eat foods that are soft and easy to digest.  Take over-the-counter or prescription medicines only as told by your doctor.  Keep all follow-up visits as told by your doctor. This is important. Contact a doctor if:  You have blood in your poop 2-3 days after the procedure. Get help right away if:  You have more than a small amount of blood in your poop.  You see large clumps of tissue (blood clots) in your poop.  Your belly is swollen.  You feel like you may vomit (nauseous).  You vomit.  You have a fever.  You have belly pain that gets worse, and medicine does not help your pain. Summary  After the procedure, it is common to have a small amount of blood in your poop. You may also have mild cramping and bloating in your belly.  If you were given a medicine to help you relax (sedative) during your procedure, it can affect you for many hours. Do not drive or use machinery until your doctor says that it is safe.  Get help right away if you have a lot of blood in your poop, feel like you may vomit, have a fever, or have more belly pain. This information is not intended to replace advice given to you by your health care provider.  Make sure you discuss any questions you have with your health care provider. Document Revised: 11/08/2018 Document Reviewed: 07/29/2018 Elsevier Patient Education  2021 [place].  Hemorrhoids Hemorrhoids are swollen veins in and around the rectum or anus. There are two types of hemorrhoids:  Internal hemorrhoids. These occur in the veins that are just inside the rectum. They may poke through to the outside and become irritated and painful.  External hemorrhoids. These occur in the veins that are outside the anus and can be felt as a painful swelling or hard lump near the anus. Most hemorrhoids do not cause serious problems, and they can be managed with home  treatments such as diet and lifestyle changes. If home treatments do not help the symptoms, procedures can be done to shrink or remove the hemorrhoids. What are the causes? This condition is caused by increased pressure in the anal area. This pressure may result from various things, including:  Constipation.  Straining to have a bowel movement.  Diarrhea.  Pregnancy.  Obesity.  Sitting for long periods of time.  Heavy lifting or other activity that causes you to strain.  Anal sex.  Riding a bike for a long period of time. What are the signs or symptoms? Symptoms of this condition include:  Pain.  Anal itching or irritation.  Rectal bleeding.  Leakage of stool (feces).  Anal swelling.  One or more lumps around the anus. How is this diagnosed? This condition can often be diagnosed through a visual exam. Other exams or tests may also be done, such as:  An exam that involves feeling the rectal area with a gloved hand (digital rectal exam).  An exam of the anal canal that is done using a small tube (anoscope).  A blood test, if you have lost a significant amount of blood.  A test to look inside the colon using a flexible tube with a camera on the end (sigmoidoscopy or colonoscopy). How is this treated? This condition can usually be treated at home. However, various procedures may be done if dietary changes, lifestyle changes, and other home treatments do not help your symptoms. These procedures can help make the hemorrhoids smaller or remove them completely. Some of these procedures involve surgery, and others do not. Common procedures include:  Rubber band ligation. Rubber bands are placed at the base of the hemorrhoids to cut off their blood supply.  Sclerotherapy. Medicine is injected into the hemorrhoids to shrink them.  Infrared coagulation. A type of light energy is used to get rid of the hemorrhoids.  Hemorrhoidectomy surgery. The hemorrhoids are surgically  removed, and the veins that supply them are tied off.  Stapled hemorrhoidopexy surgery. The surgeon staples the base of the hemorrhoid to the rectal wall. Follow these instructions at home: Eating and drinking  Eat foods that have a lot of fiber in them, such as whole grains, beans, nuts, fruits, and vegetables.  Ask your health care provider about taking products that have added fiber (fiber supplements).  Reduce the amount of fat in your diet. You can do this by eating low-fat dairy products, eating less red meat, and avoiding processed foods.  Drink enough fluid to keep your urine pale yellow.   Managing pain and swelling  Take warm sitz baths for 20 minutes, 3-4 times a day to ease pain and discomfort. You may do this in a bathtub or using a portable sitz bath that fits over the toilet.  If directed, apply ice to the affected area.  Using ice packs between sitz baths may be helpful. ? Put ice in a plastic bag. ? Place a towel between your skin and the bag. ? Leave the ice on for 20 minutes, 2-3 times a day.   General instructions  Take over-the-counter and prescription medicines only as told by your health care provider.  Use medicated creams or suppositories as told.  Get regular exercise. Ask your health care provider how much and what kind of exercise is best for you. In general, you should do moderate exercise for at least 30 minutes on most days of the week (150 minutes each week). This can include activities such as walking, biking, or yoga.  Go to the bathroom when you have the urge to have a bowel movement. Do not wait.  Avoid straining to have bowel movements.  Keep the anal area dry and clean. Use wet toilet paper or moist towelettes after a bowel movement.  Do not sit on the toilet for long periods of time. This increases blood pooling and pain.  Keep all follow-up visits as told by your health care provider. This is important. Contact a health care provider if you  have:  Increasing pain and swelling that are not controlled by treatment or medicine.  Difficulty having a bowel movement, or you are unable to have a bowel movement.  Pain or inflammation outside the area of the hemorrhoids. Get help right away if you have:  Uncontrolled bleeding from your rectum. Summary  Hemorrhoids are swollen veins in and around the rectum or anus.  Most hemorrhoids can be managed with home treatments such as diet and lifestyle changes.  Taking warm sitz baths can help ease pain and discomfort.  In severe cases, procedures or surgery can be done to shrink or remove the hemorrhoids. This information is not intended to replace advice given to you by your health care provider. Make sure you discuss any questions you have with your health care provider. Document Revised: 05/31/2018 Document Reviewed: 05/24/2017 Elsevier Patient Education  [place].   Colon Polyps  Colon polyps are tissue growths inside the colon, which is part of the large intestine. They are one of the types of polyps that can grow in the body. A polyp may be a round bump or a mushroom-shaped growth. You could have one polyp or more than one. Most colon polyps are noncancerous (benign). However, some colon polyps can become cancerous over time. Finding and removing the polyps early can help prevent this. What are the causes? The exact cause of colon polyps is not known. What increases the risk? The following factors may make you more likely to develop this condition:  Having a family history of colorectal cancer or colon polyps.  Being older than 73 years of age.  Being younger than 73 years of age and having a significant family history of colorectal cancer or colon polyps or a genetic condition that puts you at higher risk of getting colon polyps.  Having inflammatory bowel disease, such as ulcerative colitis or Crohn's disease.  Having certain conditions passed from parent to  child (hereditary conditions), such as: ? Familial adenomatous polyposis (FAP). ? Lynch syndrome. ? Turcot syndrome. ? Peutz-Jeghers syndrome. ? MUTYH-associated polyposis (MAP).  Being overweight.  Certain lifestyle factors. These include smoking cigarettes, drinking too much alcohol, not getting enough exercise, and eating a diet that is high in fat and red meat and low in fiber.  Having had childhood cancer that was treated with radiation  of the abdomen. What are the signs or symptoms? Many times, there are no symptoms. If you have symptoms, they may include:  Blood coming from the rectum during a bowel movement.  Blood in the stool (feces). The blood may be bright red or very dark in color.  Pain in the abdomen.  A change in bowel habits, such as constipation or diarrhea. How is this diagnosed? This condition is diagnosed with a colonoscopy. This is a procedure in which a lighted, flexible scope is inserted into the opening between the buttocks (anus) and then passed into the colon to examine the area. Polyps are sometimes found when a colonoscopy is done as part of routine cancer screening tests. How is this treated? This condition is treated by removing any polyps that are found. Most polyps can be removed during a colonoscopy. Those polyps will then be tested for cancer. Additional treatment may be needed depending on the results of testing. Follow these instructions at home: Eating and drinking  Eat foods that are high in fiber, such as fruits, vegetables, and whole grains.  Eat foods that are high in calcium and vitamin D, such as milk, cheese, yogurt, eggs, liver, fish, and broccoli.  Limit foods that are high in fat, such as fried foods and desserts.  Limit the amount of red meat, precooked or cured meat, or other processed meat that you eat, such as hot dogs, sausages, bacon, or meat loaves.  Limit sugary drinks.   Lifestyle  Maintain a healthy weight, or lose  weight if recommended by your health care provider.  Exercise every day or as told by your health care provider.  Do not use any products that contain nicotine or tobacco, such as cigarettes, e-cigarettes, and chewing tobacco. If you need help quitting, ask your health care provider.  Do not drink alcohol if: ? Your health care provider tells you not to drink. ? You are pregnant, may be pregnant, or are planning to become pregnant.  If you drink alcohol: ? Limit how much you use to:  0-1 drink a day for women.  0-2 drinks a day for men. ? Know how much alcohol is in your drink. In the [place] one drink equals one 12 oz bottle of beer (355 mL), one 5 oz glass of wine (148 mL), or one 1 oz glass of hard liquor (44 mL). General instructions  Take over-the-counter and prescription medicines only as told by your health care provider.  Keep all follow-up visits. This is important. This includes having regularly scheduled colonoscopies. Talk to your health care provider about when you need a colonoscopy. Contact a health care provider if:  You have new or worsening bleeding during a bowel movement.  You have new or increased blood in your stool.  You have a change in bowel habits.  You lose weight for no known reason. Summary  Colon polyps are tissue growths inside the colon, which is part of the large intestine. They are one type of polyp that can grow in the body.  Most colon polyps are noncancerous (benign), but some can become cancerous over time.  This condition is diagnosed with a colonoscopy.  This condition is treated by removing any polyps that are found. Most polyps can be removed during a colonoscopy. This information is not intended to replace advice given to you by your health care provider. Make sure you discuss any questions you have with your health care provider. Document Revised: 04/23/2019 Document Reviewed: 04/23/2019 Elsevier Patient  Education  2021 [employer].

## 2020-03-10 NOTE — Anesthesia Procedure Notes (Signed)
Date/Time: 03/10/2020 1:06 PM Performed by: Vista Deck, CRNA Pre-anesthesia Checklist: Patient identified, Emergency Drugs available, Suction available, Timeout performed and Patient being monitored Patient Re-evaluated:Patient Re-evaluated prior to induction Oxygen Delivery Method: Nasal Cannula

## 2020-03-10 NOTE — Transfer of Care (Signed)
Immediate Anesthesia Transfer of Care Note  Patient: Jacqueline Harris  Procedure(s) Performed: COLONOSCOPY WITH PROPOFOL (N/A ) POLYPECTOMY  Patient Location: Short Stay  Anesthesia Type:General  Level of Consciousness: awake and patient cooperative  Airway & Oxygen Therapy: Patient Spontanous Breathing  Post-op Assessment: Report given to RN and Post -op Vital signs reviewed and stable  Post vital signs: Reviewed and stable  Last Vitals:  Vitals Value Taken Time  BP 91/67 03/10/20 1341  Temp 36.6 C 03/10/20 1341  Pulse    Resp 18 03/10/20 1341  SpO2 100 % 03/10/20 1341    Last Pain:  Vitals:   03/10/20 1341  TempSrc: Oral  PainSc: 0-No pain         Complications: No complications documented.

## 2020-03-10 NOTE — H&P (Signed)
Jacqueline Harris is an 75 y.o. female.   Chief Complaint: Patient is here for colonoscopy HPI: This 75 year old Caucasian female was history of colonic adenomas and family history of CRC in her brother who is here for surveillance exam.  She denies abdominal pain change in bowel habits or rectal bleeding.  She says since she has been on iron her stool has been black. Last colonoscopy was in November 2016 and she did not have any polyps. Last Eliquis dose was 3 days ago. Her brother was diagnosed with colon carcinoma at age 10 and died of metastatic disease 2 years later.  Past Medical History:  Diagnosis Date  . Hypertension   . Prediabetes        History of left DVT  Past Surgical History:  Procedure Laterality Date  . CHOLECYSTECTOMY    . COLONOSCOPY N/A 12/03/2014   Procedure: COLONOSCOPY;  Surgeon: Rogene Houston, MD;  Location: [facility];  Service: Endoscopy;  Laterality: N/A;  34    Family History  Problem Relation Age of Onset  . Cancer - Lung Mother   . Cancer Father   . Cancer - Colon Brother   . Diabetes Sister   . Hypertension Sister    Social History:  reports that she has quit smoking. She has never used smokeless tobacco. She reports that she does not drink alcohol and does not use drugs.  Allergies:  Allergies  Allergen Reactions  . 5-Alpha Reductase Inhibitors     Medications Prior to Admission  Medication Sig Dispense Refill  . acetaminophen (TYLENOL) 650 MG CR tablet Take 650 mg by mouth every 8 (eight) hours as needed for pain.    Marland Kitchen apixaban (ELIQUIS) 5 MG TABS tablet Take 1 tablet (5 mg total) by mouth 2 (two) times daily. 60 tablet 0  . Ferrous Sulfate (SLOW FE) 142 (45 Fe) MG TBCR Take 142 mg by mouth daily.    . metoprolol succinate (TOPROL XL) 25 MG 24 hr tablet Take 1 tablet (25 mg total) by mouth daily. (Patient taking differently: Take 12.5 mg by mouth daily as needed (If blood pressure is 114 or under do not take).) 90 tablet 3  . rosuvastatin  (CRESTOR) 5 MG tablet Take 5 mg by mouth every Friday.    Marland Kitchen atorvastatin (LIPITOR) 10 MG tablet Take 1 tablet (10 mg total) by mouth daily. (Patient not taking: Reported on 03/01/2020) 90 tablet 3    No results found for this or any previous visit (from the past 48 hour(s)). No results found.  Review of Systems  Blood pressure (!) 126/55, pulse (!) 104, temperature 97.7 F (36.5 C), temperature source Oral, resp. rate (!) 22, height _0  (1.626 m), SpO2 95 %. Physical Exam HENT:     Mouth/Throat:     Mouth: Mucous membranes are moist.     Pharynx: Oropharynx is clear.  Eyes:     General: No scleral icterus.    Conjunctiva/sclera: Conjunctivae normal.  Cardiovascular:     Rate and Rhythm: Normal rate and regular rhythm.     Heart sounds: Normal heart sounds. No murmur heard.   Pulmonary:     Effort: Pulmonary effort is normal.     Breath sounds: Normal breath sounds.  Abdominal:     General: There is no distension.     Palpations: Abdomen is soft. There is no mass.     Tenderness: There is no abdominal tenderness.  Musculoskeletal:        General:  No swelling.     Cervical back: Neck supple.  Lymphadenopathy:     Cervical: No cervical adenopathy.  Skin:    General: Skin is warm and dry.  Neurological:     Mental Status: She is alert.      Assessment/Plan  History of colonic adenomas Family history of CRC in first-degree relative Williams colonoscopy  Hildred Laser, MD 03/10/2020, 1:04 PM

## 2020-03-10 NOTE — Anesthesia Postprocedure Evaluation (Signed)
Anesthesia Post Note  Patient: Jacqueline Harris  Procedure(s) Performed: COLONOSCOPY WITH PROPOFOL (N/A ) POLYPECTOMY  Patient location during evaluation: Phase II Anesthesia Type: General Level of consciousness: awake and alert and patient cooperative Pain management: satisfactory to patient Vital Signs Assessment: post-procedure vital signs reviewed and stable Respiratory status: patient connected to nasal cannula oxygen Cardiovascular status: stable Postop Assessment: no apparent nausea or vomiting Anesthetic complications: no   No complications documented.   Last Vitals:  Vitals:   03/10/20 1228 03/10/20 1341  BP: (!) 126/55 91/67  Pulse: (!) 104   Resp: (!) 22 18  Temp: 36.5 C 36.6 C  SpO2: 95% 100%    Last Pain:  Vitals:   03/10/20 1341  TempSrc: Oral  PainSc: 0-No pain                 Dorr Perrot

## 2020-03-10 NOTE — Anesthesia Preprocedure Evaluation (Signed)
Anesthesia Evaluation  Patient identified by MRN, date of birth, ID band Patient awake    Reviewed: Allergy & Precautions, H&P , NPO status , Patient's Chart, lab work & pertinent test results, reviewed documented beta blocker date and time   Airway Mallampati: II  TM Distance: >3 FB Neck ROM: full    Dental no notable dental hx.    Pulmonary pneumonia, resolved, former smoker,    Pulmonary exam normal breath sounds clear to auscultation       Cardiovascular Exercise Tolerance: Good hypertension, + CAD   Rhythm:regular Rate:Normal     Neuro/Psych negative neurological ROS  negative psych ROS   GI/Hepatic negative GI ROS, Neg liver ROS,   Endo/Other  negative endocrine ROS  Renal/GU negative Renal ROS  negative genitourinary   Musculoskeletal   Abdominal   Peds  Hematology  (+) Blood dyscrasia, anemia ,   Anesthesia Other Findings   Reproductive/Obstetrics negative OB ROS                             Anesthesia Physical Anesthesia Plan  ASA: III  Anesthesia Plan: General   Post-op Pain Management:    Induction:   PONV Risk Score and Plan: Propofol infusion  Airway Management Planned:   Additional Equipment:   Intra-op Plan:   Post-operative Plan:   Informed Consent: I have reviewed the patients History and Physical, chart, labs and discussed the procedure including the risks, benefits and alternatives for the proposed anesthesia with the patient or authorized representative who has indicated his/her understanding and acceptance.     Dental Advisory Given  Plan Discussed with: CRNA  Anesthesia Plan Comments:         Anesthesia Quick Evaluation

## 2020-03-10 NOTE — Op Note (Signed)
[facility] Patient Name: Jacqueline Harris Procedure Date: 03/10/2020 12:58 PM MRN: [id number] Date of Birth: [birth year] Attending MD: Hildred Laser , MD CSN: [id number] Age: 75 Admit Type: Outpatient Procedure:                Colonoscopy Indications:              High risk colon cancer surveillance: Personal                            history of colonic polyps Providers:                Hildred Laser, MD, Caprice Kluver, Randa Spike,                            Technician Referring MD:             Glenda Chroman, MD Medicines:                Propofol per Anesthesia Complications:            No immediate complications. Estimated Blood Loss:     Estimated blood loss was minimal. Procedure:                Pre-Anesthesia Assessment:                           - Prior to the procedure, a History and Physical                            was performed, and patient medications and                            allergies were reviewed. The patient's tolerance of                            previous anesthesia was also reviewed. The risks                            and benefits of the procedure and the sedation                            options and risks were discussed with the patient.                            All questions were answered, and informed consent                            was obtained. Prior Anticoagulants: The patient                            last took Eliquis (apixaban) 3 days prior to the                            procedure. ASA Grade Assessment: II - A patient  with mild systemic disease. After reviewing the                            risks and benefits, the patient was deemed in                            satisfactory condition to undergo the procedure.                           After obtaining informed consent, the colonoscope                            was passed under direct vision. Throughout the                            procedure, the  patient's blood pressure, pulse, and                            oxygen saturations were monitored continuously. The                            [id number]-[id number] ([id number]) scope was introduced through                            the anus and advanced to the the cecum, identified                            by appendiceal orifice and ileocecal valve. The                            colonoscopy was technically difficult and complex                            due to significant looping. Successful completion                            of the procedure was aided by using manual pressure                            and scope guide. The patient tolerated the                            procedure well. The quality of the bowel                            preparation was adequate. The ileocecal valve,                            appendiceal orifice, and rectum were photographed. Scope In: 1:12:29 PM Scope Out: 1:35:15 PM Scope Withdrawal Time: 0 hours 8 minutes 10 seconds  Total Procedure Duration: 0 hours 22 minutes 46 seconds  Findings:      The perianal and digital rectal examinations were normal.      A 4 mm polyp was  found in the cecum. The polyp was sessile. The polyp       was removed with a cold snare. Resection and retrieval were complete.      The exam was otherwise normal throughout the examined colon.      External hemorrhoids were found during retroflexion. The hemorrhoids       were small. Impression:               - One 4 mm polyp in the cecum, removed with a cold                            snare. Resected and retrieved.                           - External hemorrhoids. Moderate Sedation:      Per Anesthesia Care Recommendation:           - Patient has a contact number available for                            emergencies. The signs and symptoms of potential                            delayed complications were discussed with the                            patient. Return to normal activities  tomorrow.                            Written discharge instructions were provided to the                            patient.                           - Resume previous diet today.                           - Continue present medications.                           - Resume Eliquis (apixaban) at prior dose tomorrow.                           - Await pathology results.                           - Repeat colonoscopy is recommended. The                            colonoscopy date will be determined after pathology                            results from today's exam become available for                            review. Procedure Code(s):        ---  Professional ---                           [phone], Colonoscopy, flexible; with removal of                            tumor(s), polyp(s), or other lesion(s) by snare                            technique Diagnosis Code(s):        --- Professional ---                           Z86.010, Personal history of colonic polyps                           K63.5, Polyp of colon                           K64.4, Residual hemorrhoidal skin tags CPT copyright 2019 [employer]. All rights reserved. The codes documented in this report are preliminary and upon coder review may  be revised to meet current compliance requirements. Hildred Laser, MD Hildred Laser, MD 03/10/2020 1:45:20 PM This report has been signed electronically. Number of Addenda: 0

## 2020-03-12 ENCOUNTER — Encounter (HOSPITAL_COMMUNITY): Payer: Self-pay | Admitting: Internal Medicine

## 2020-03-12 LAB — SURGICAL PATHOLOGY

## 2020-03-16 ENCOUNTER — Other Ambulatory Visit: Payer: Self-pay

## 2020-03-16 ENCOUNTER — Other Ambulatory Visit: Payer: Medicare HMO

## 2020-03-16 ENCOUNTER — Encounter (INDEPENDENT_AMBULATORY_CARE_PROVIDER_SITE_OTHER): Payer: Self-pay | Admitting: *Deleted

## 2020-03-16 DIAGNOSIS — I3139 Other pericardial effusion (noninflammatory): Secondary | ICD-10-CM

## 2020-03-16 DIAGNOSIS — I82409 Acute embolism and thrombosis of unspecified deep veins of unspecified lower extremity: Secondary | ICD-10-CM

## 2020-03-16 DIAGNOSIS — I50810 Right heart failure, unspecified: Secondary | ICD-10-CM | POA: Diagnosis not present

## 2020-03-16 DIAGNOSIS — I313 Pericardial effusion (noninflammatory): Secondary | ICD-10-CM | POA: Diagnosis not present

## 2020-03-16 DIAGNOSIS — I2510 Atherosclerotic heart disease of native coronary artery without angina pectoris: Secondary | ICD-10-CM | POA: Diagnosis not present

## 2020-03-16 LAB — LIPID PANEL
Chol/HDL Ratio: 5.4 ratio — ABNORMAL HIGH (ref 0.0–4.4)
Cholesterol, Total: 136 mg/dL (ref 100–199)
HDL: 25 mg/dL — ABNORMAL LOW (ref >39)
LDL Chol Calc (NIH): 92 mg/dL (ref 0–99)
Triglycerides: 99 mg/dL (ref 0–149)
VLDL Cholesterol Cal: 19 mg/dL (ref 5–40)

## 2020-03-16 LAB — HEPATIC FUNCTION PANEL
ALT: 8 IU/L (ref 0–32)
AST: 13 IU/L (ref 0–40)
Albumin: 2.9 g/dL — ABNORMAL LOW (ref 3.7–4.7)
Alkaline Phosphatase: 70 IU/L (ref 44–121)
Bilirubin Total: 0.2 mg/dL (ref 0.0–1.2)
Bilirubin, Direct: <0.1 mg/dL (ref 0.00–0.40)
Total Protein: 6.4 g/dL (ref 6.0–8.5)

## 2020-03-29 NOTE — Progress Notes (Signed)
Cardiology Office Note    Date:  03/31/2020   ID:  Jacqueline Harris, Jacqueline Harris Feb 24, 1945, MRN [id number]   PCP:  Glenda Chroman, MD   [place]  Cardiologist:  Ena Dawley, MD  Advanced Practice Provider:  No care team member to display Electrophysiologist:  None   [phone]   No chief complaint on file.   History of Present Illness:  Jacqueline Harris is a 75 y.o. female with no prior cardiac history who was seen in the hospital 11/22/2019 after Covid infection in September. she presented with cough fever shortness of breath and was found to have multifocal pneumonia and pericardial effusion.  There was no evidence of tamponade and plan was to repeat echo once pulmonary issues  Resolved.  CT was negative for PE.  She had coronary calcifications and aortic atherosclerosis on her chest CTA.  Recommend statin.  Cautious with aspirin as she has microcytic anemia that needs to be evaluated. Had DVT and is on Eliquis.   I saw the patient 12/17/2019 at which time she was stable from a cardiac standpoint.  With seeing PCP for cyanotic toes.  Follow-up echo done today.  Patient comes in for f/u accompanied by her daughter. BP running 113/60-130/70, P 90-104. Gets short of breath if she overexerts herself, stairs are hard   Past Medical History:  Diagnosis Date  . Hypertension   . Prediabetes     Past Surgical History:  Procedure Laterality Date  . CHOLECYSTECTOMY    . COLONOSCOPY N/A 12/03/2014   Procedure: COLONOSCOPY;  Surgeon: Rogene Houston, MD;  [facility]: [facility];  Service: Endoscopy;  Laterality: N/A;  830  . COLONOSCOPY WITH PROPOFOL N/A 03/10/2020   Procedure: COLONOSCOPY WITH PROPOFOL;  Surgeon: Rogene Houston, MD;  Location: [facility];  Service: Endoscopy;  Laterality: N/A;  1:15  . POLYPECTOMY  03/10/2020   Procedure: POLYPECTOMY;  Surgeon: Rogene Houston, MD;  Location: [facility];  Service: Endoscopy;;    Current  Medications: Current Meds  Medication Sig  . acetaminophen (TYLENOL) 650 MG CR tablet Take 650 mg by mouth every 8 (eight) hours as needed for pain.  Marland Kitchen apixaban (ELIQUIS) 5 MG TABS tablet Take 1 tablet (5 mg total) by mouth 2 (two) times daily.  . Ferrous Sulfate (SLOW FE) 142 (45 Fe) MG TBCR Take 142 mg by mouth daily.  . metoprolol succinate (TOPROL XL) 25 MG 24 hr tablet Take 1 tablet (25 mg total) by mouth daily. (Patient taking differently: Take 12.5 mg by mouth daily as needed (If blood pressure is 114 or under do not take).)  . rosuvastatin (CRESTOR) 5 MG tablet Take 5 mg by mouth every Friday.  . [DISCONTINUED] furosemide (LASIX) 20 MG tablet Take 1 tablet (20 mg total) by mouth daily.  . [DISCONTINUED] potassium chloride (KLOR-CON) 10 MEQ tablet Take 1 tablet (10 mEq total) by mouth daily.     Allergies:   Patient has no active allergies.   Social History   Socioeconomic History  . Marital status: Divorced    Spouse name: Not on file  . Number of children: Not on file  . Years of education: Not on file  . Highest education level: Not on file  Occupational History  . Not on file  Tobacco Use  . Smoking status: Former Research scientist (life sciences)  . Smokeless tobacco: Never Used  Vaping Use  . Vaping Use: Never used  Substance and Sexual Activity  . Alcohol  use: No  . Drug use: No  . Sexual activity: Not on file  Other Topics Concern  . Not on file  Social History Narrative  . Not on file   Social Determinants of Health   Financial Resource Strain: Not on file  Food Insecurity: Not on file  Transportation Needs: Not on file  Physical Activity: Not on file  Stress: Not on file  Social Connections: Not on file     Family History:  The patient's   family history includes Cancer in her father; Cancer - Colon in her brother; Cancer - Lung in her mother; Diabetes in her sister; Hypertension in her sister.   ROS:   Please see the history of present illness.    ROS All other systems  reviewed and are negative.   PHYSICAL EXAM:   VS:  BP 130/62   Pulse 100   Ht _0  (1.626 m)   Wt 135 lb (61.2 kg)   SpO2 98%   BMI 23.17 kg/m   Physical Exam  GEN: Thin, in no acute distress  Neck: slight increased JVD, no carotid bruits, or masses Cardiac:RRR; no murmurs, rubs, or gallops  Respiratory:  clear to auscultation bilaterally, normal work of breathing GI: soft, nontender, nondistended, + BS Ext: without cyanosis, clubbing, or edema, Good distal pulses bilaterally Neuro:  Alert and Oriented x 3 Psych: euthymic mood, full affect  Wt Readings from Last 3 Encounters:  03/31/20 135 lb (61.2 kg)  03/08/20 135 lb (61.2 kg)  12/17/19 135 lb 3.2 oz (61.3 kg)      Studies/Labs Reviewed:   EKG:  EKG is not ordered today.   Recent Labs: 11/21/2019: B Natriuretic Peptide 492.4 11/22/2019: TSH 6.024 11/25/2019: BUN 10; Creatinine, Ser 0.85; Hemoglobin 9.0; Platelets 443; Potassium 4.2; Sodium 136 03/16/2020: ALT 8   Lipid Panel    Component Value Date/Time   CHOL 136 03/16/2020 0956   TRIG 99 03/16/2020 0956   HDL 25 (L) 03/16/2020 0956   CHOLHDL 5.4 (H) 03/16/2020 0956   CHOLHDL 8.8 11/23/2019 0715   VLDL 19 11/23/2019 0715   LDLCALC 92 03/16/2020 0956    Additional studies/ records that were reviewed today include:   Echo 03/31/20   ECHOCARDIOGRAM COMPLETE 11/22/2019   1. Left ventricular ejection fraction, by estimation, is 50 to 55%. The  left ventricle has low normal function. The left ventricle demonstrates  global hypokinesis. Left ventricular diastolic parameters are consistent  with Grade I diastolic dysfunction  (impaired relaxation). There is the interventricular septum is flattened  in systole and diastole, consistent with right ventricular pressure and  volume overload.   2. Right ventricular systolic function is moderately reduced. The right  ventricular size is moderately enlarged. There is severely elevated  pulmonary artery systolic pressure.  The estimated right ventricular  systolic pressure is 85.4 mmHg.   3. A small pericardial effusion is present. The pericardial effusion is  posterior and lateral to the left ventricle. There is no evidence of  cardiac tamponade.   4. The mitral valve is normal in structure. No evidence of mitral valve  regurgitation. No evidence of mitral stenosis.   5. Tricuspid valve regurgitation is moderate.               Risk Assessment/Calculations:         ASSESSMENT:    1. Pericardial effusion   2. RVF (right ventricular failure) ([place])   3. Coronary artery calcification   4. Deep vein thrombosis (DVT) of  lower extremity, unspecified chronicity, unspecified laterality, unspecified vein (HCC)      PLAN:  In order of problems listed above: Small pericardial effusion without evidence of tamponade on echo post Covid infection 11/22/19.  Marland Kitchen Chronic DOE, suspect a bit of fluid overload. Will give her low dose lasix and potassium. Check labs today including bnp.- Dr. Gilman Schmidt just read her echo that shows normal LVEF 60-65% mild LVH, severely dilated PASP RVSP 93.6, mod pericardial effusion, no tamponade. Will diurese, get her in with pulm and have her f/u with pulm and Dr. Domenic Polite since she lives  in [place] and has trouble getting to [facility]   RV dilatation and dysfunction most likely secondary to pulmonary etiology ? Recent Covid  infection, DVT ? Possible PE but not detected on CTA 11/21/19. repeat echo today-see above.  Coronary calcification and aortic atherosclerosis on chest CTA low-dose Lipitor started did not tolerate in the past but she did not remember which one.  DVT on Eliquis   Shared Decision Making/Informed Consent        Medication Adjustments/Labs and Tests Ordered: Current medicines are reviewed at length with the patient today.  Concerns regarding medicines are outlined above.  Medication changes, Labs and Tests ordered today are listed in the Patient Instructions  below. Patient Instructions   Medication Instructions:  1) Start Furosemide (Lasix) 20 mg daily, take 40 mg by mouth daily for 3 days then decrease to 20 mg daily   2) Start Potassium Chloride 10 meq daily, take 20 meq daily for 3 days then decrease to 10 meq daily   *If you need a refill on your cardiac medications before your next appointment, please call your pharmacy*   Lab Work: Pro Bnp, Bmp, Cbc- today   If you have labs (blood work) drawn today and your tests are completely normal, you will receive your results only by: Marland Kitchen MyChart Message (if you have MyChart) OR . A paper copy in the mail If you have any lab test that is abnormal or we need to change your treatment, we will call you to review the results.   Testing/Procedures: None ordered    Follow-Up: Follow up with Dr. Rozann Lesches on 04/05/2020 @ 3:00 PM   Your physician has referred you to see Dr. Christinia Gully, MD at [facility]   Other Instructions Two Gram Sodium Diet 2000 mg  What is Sodium? Sodium is a mineral found naturally in many foods. The most significant source of sodium in the diet is table salt, which is about 40% sodium.  Processed, convenience, and preserved foods also contain a large amount of sodium.  The body needs only 500 mg of sodium daily to function,  A normal diet provides more than enough sodium even if you do not use salt.  Why Limit Sodium? A build up of sodium in the body can cause thirst, increased blood pressure, shortness of breath, and water retention.  Decreasing sodium in the diet can reduce edema and risk of heart attack or stroke associated with high blood pressure.  Keep in mind that there are many other factors involved in these health problems.  Heredity, obesity, lack of exercise, cigarette smoking, stress and what you eat all play a role.  General Guidelines:  Do not add salt at the table or in cooking.  One teaspoon of salt contains over 2 grams of  sodium.  Read food labels  Avoid processed and convenience foods  Ask your dietitian before eating any foods  not dicussed in the menu planning guidelines  Consult your physician if you wish to use a salt substitute or a sodium containing medication such as antacids.  Limit milk and milk products to 16 oz (2 cups) per day.  Shopping Hints:  READ LABELS!! "Dietetic" does not necessarily mean low sodium.  Salt and other sodium ingredients are often added to foods during processing.   Menu Planning Guidelines Food Group Choose More Often Avoid  Beverages (see also the milk group All fruit juices, low-sodium, salt-free vegetables juices, low-sodium carbonated beverages Regular vegetable or tomato juices, commercially softened water used for drinking or cooking  Breads and Cereals Enriched white, wheat, rye and pumpernickel bread, hard rolls and dinner rolls; muffins, cornbread and waffles; most dry cereals, cooked cereal without added salt; unsalted crackers and breadsticks; low sodium or homemade bread crumbs Bread, rolls and crackers with salted tops; quick breads; instant hot cereals; pancakes; commercial bread stuffing; self-rising flower and biscuit mixes; regular bread crumbs or cracker crumbs  Desserts and Sweets Desserts and sweets mad with mild should be within allowance Instant pudding mixes and cake mixes  Fats Butter or margarine; vegetable oils; unsalted salad dressings, regular salad dressings limited to 1 Tbs; light, sour and heavy cream Regular salad dressings containing bacon fat, bacon bits, and salt pork; snack dips made with instant soup mixes or processed cheese; salted nuts  Fruits Most fresh, frozen and canned fruits Fruits processed with salt or sodium-containing ingredient (some dried fruits are processed with sodium sulfites        Vegetables Fresh, frozen vegetables and low- sodium canned vegetables Regular canned vegetables, sauerkraut, pickled vegetables, and  others prepared in brine; frozen vegetables in sauces; vegetables seasoned with ham, bacon or salt pork  Condiments, Sauces, Miscellaneous  Salt substitute with physician's approval; pepper, herbs, spices; vinegar, lemon or lime juice; hot pepper sauce; garlic powder, onion powder, low sodium soy sauce (1 Tbs.); low sodium condiments (ketchup, chili sauce, mustard) in limited amounts (1 tsp.) fresh ground horseradish; unsalted tortilla chips, pretzels, potato chips, popcorn, salsa (1/4 cup) Any seasoning made with salt including garlic salt, celery salt, onion salt, and seasoned salt; sea salt, rock salt, kosher salt; meat tenderizers; monosodium glutamate; mustard, regular soy sauce, barbecue, sauce, chili sauce, teriyaki sauce, steak sauce, Worcestershire sauce, and most flavored vinegars; canned gravy and mixes; regular condiments; salted snack foods, olives, picles, relish, horseradish sauce, catsup   Food preparation: Try these seasonings Meats:    Pork Sage, onion Serve with applesauce  Chicken Poultry seasoning, thyme, parsley Serve with cranberry sauce  Lamb Curry powder, rosemary, garlic, thyme Serve with mint sauce or jelly  Veal Marjoram, basil Serve with current jelly, cranberry sauce  Beef Pepper, bay leaf Serve with dry mustard, unsalted chive butter  Fish [place] leaf, dill Serve with unsalted lemon butter, unsalted parsley butter  Vegetables:    Asparagus Lemon juice   Broccoli Lemon juice   Carrots Mustard dressing parsley, mint, nutmeg, glazed with unsalted butter and sugar   Green beans Marjoram, lemon juice, nutmeg,dill seed   Tomatoes Basil, marjoram, onion   Spice /blend for [employer]" 4 tsp ground thyme 1 tsp ground sage 3 tsp ground rosemary 4 tsp ground marjoram   Test your knowledge 1. A product that says "Salt Free" may still contain sodium. True or False 2. Garlic Powder and Hot Pepper Sauce an be used as alternative seasonings.True or False 3. Processed foods have  more sodium than fresh foods.  True  or False 4. Canned Vegetables have less sodium than froze True or False  WAYS TO DECREASE YOUR SODIUM INTAKE 1. Avoid the use of added salt in cooking and at the table.  Table salt (and other prepared seasonings which contain salt) is probably one of the greatest sources of sodium in the diet.  Unsalted foods can gain flavor from the sweet, sour, and butter taste sensations of herbs and spices.  Instead of using salt for seasoning, try the following seasonings with the foods listed.  Remember: how you use them to enhance natural food flavors is limited only by your creativity... Allspice-Meat, fish, eggs, fruit, peas, red and yellow vegetables Almond Extract-Fruit baked goods Anise Seed-Sweet breads, fruit, carrots, beets, cottage cheese, cookies (tastes like licorice) Basil-Meat, fish, eggs, vegetables, rice, vegetables salads, soups, sauces Bay Leaf-Meat, fish, stews, poultry Burnet-Salad, vegetables (cucumber-like flavor) Caraway Seed-Bread, cookies, cottage cheese, meat, vegetables, cheese, rice Cardamon-Baked goods, fruit, soups Celery Powder or seed-Salads, salad dressings, sauces, meatloaf, soup, bread.Do not use  celery salt Chervil-Meats, salads, fish, eggs, vegetables, cottage cheese (parsley-like flavor) Chili Power-Meatloaf, chicken cheese, corn, eggplant, egg dishes Chives-Salads cottage cheese, egg dishes, soups, vegetables, sauces Cilantro-Salsa, casseroles Cinnamon-Baked goods, fruit, pork, lamb, chicken, carrots Cloves-Fruit, baked goods, fish, pot roast, green beans, beets, carrots Coriander-Pastry, cookies, meat, salads, cheese (lemon-orange flavor) Cumin-Meatloaf, fish,cheese, eggs, cabbage,fruit pie (caraway flavor) [employer], fruit, eggs, fish, poultry, cottage cheese, vegetables Dill Seed-Meat, cottage cheese, poultry, vegetables, fish, salads, bread Fennel Seed-Bread, cookies, apples, pork, eggs, fish, beets, cabbage, cheese,  Licorice-like flavor Garlic-(buds or powder) Salads, meat, poultry, fish, bread, butter, vegetables, potatoes.Do not  use garlic salt Ginger-Fruit, vegetables, baked goods, meat, fish, poultry Horseradish Root-Meet, vegetables, butter Lemon Juice or Extract-Vegetables, fruit, tea, baked goods, fish salads Mace-Baked goods fruit, vegetables, fish, poultry (taste like nutmeg) Maple Extract-Syrups Marjoram-Meat, chicken, fish, vegetables, breads, green salads (taste like Sage) Mint-Tea, lamb, sherbet, vegetables, desserts, carrots, cabbage Mustard, Dry or Seed-Cheese, eggs, meats, vegetables, poultry Nutmeg-Baked goods, fruit, chicken, eggs, vegetables, desserts Onion Powder-Meat, fish, poultry, vegetables, cheese, eggs, bread, rice salads (Do not use   Onion salt) Orange Extract-Desserts, baked goods Oregano-Pasta, eggs, cheese, onions, pork, lamb, fish, chicken, vegetables, green salads Paprika-Meat, fish, poultry, eggs, cheese, vegetables Parsley Flakes-Butter, vegetables, meat fish, poultry, eggs, bread, salads (certain forms may   Contain sodium Pepper-Meat fish, poultry, vegetables, eggs Peppermint Extract-Desserts, baked goods Poppy Seed-Eggs, bread, cheese, fruit dressings, baked goods, noodles, vegetables, cottage  [employer], poultry, meat, fish, cauliflower, turnips,eggs bread Saffron-Rice, bread, veal, chicken, fish, eggs Sage-Meat, fish, poultry, onions, eggplant, tomateos, pork, stews Savory-Eggs, salads, poultry, meat, rice, vegetables, soups, pork Tarragon-Meat, poultry, fish, eggs, butter, vegetables (licorice-like flavor)  Thyme-Meat, poultry, fish, eggs, vegetables, (clover-like flavor), sauces, soups Tumeric-Salads, butter, eggs, fish, rice, vegetables (saffron-like flavor) Vanilla Extract-Baked goods, candy Vinegar-Salads, vegetables, meat marinades Walnut Extract-baked goods, candy  2. Choose your Foods Wisely   The  following is a list of foods to avoid which are high in sodium:  Meats-Avoid all smoked, canned, salt cured, dried and kosher meat and fish as well as Anchovies   [employer] meats:Bologna, Liverwurst, Pastrami Canned meat or fish  Marinated herring Caviar    Pepperoni Corned Beef   Pizza Dried chipped beef  Salami Frozen breaded fish or meat Salt pork Frankfurters or hot dogs  Sardines Gefilte fish   Sausage Ham (boiled ham, Proscuitto Smoked butt    spiced ham)   Spam  TV Dinners Vegetables Canned vegetables (Regular) Relish Canned mushrooms  Sauerkraut Olives    Tomato juice Pickles  Bakery and Dessert Products Canned puddings  Cream pies Cheesecake   Decorated cakes Cookies  Beverages/Juices Tomato juice, regular  Gatorade   V-8 vegetable juice, regular  Breads and Cereals Biscuit mixes   Salted potato chips, corn chips, pretzels Bread stuffing mixes  Salted crackers and rolls Pancake and waffle mixes Self-rising flour  Seasonings Accent    Meat sauces Barbecue sauce  Meat tenderizer Catsup    Monosodium glutamate (MSG) Celery salt   Onion salt Chili sauce   Prepared mustard Garlic salt   Salt, seasoned salt, sea salt Gravy mixes   Soy sauce Horseradish   Steak sauce Ketchup   Tartar sauce Lite salt    Teriyaki sauce Marinade mixes   Worcestershire sauce  Others Baking powder   Cocoa and cocoa mixes Baking soda   Commercial casserole mixes Candy-caramels, chocolate  Dehydrated soups    Bars, fudge,nougats  Instant rice and pasta mixes Canned broth or soup  Maraschino cherries Cheese, aged and processed cheese and cheese spreads  Learning Assessment Quiz  Indicated T (for True) or F (for False) for each of the following statements:  1. _____ Fresh fruits and vegetables and unprocessed grains are generally low in sodium 2. _____ Water may contain a considerable amount of sodium, depending on the source 3. _____ You can always tell if a  food is high in sodium by tasting it 4. _____ Certain laxatives my be high in sodium and should be avoided unless prescribed   by a physician or pharmacist 5. _____ Salt substitutes may be used freely by anyone on a sodium restricted diet 6. _____ Sodium is present in table salt, food additives and as a natural component of   most foods 7. _____ Table salt is approximately 90% sodium 8. _____ Limiting sodium intake may help prevent excess fluid accumulation in the body 9. _____ On a sodium-restricted diet, seasonings such as bouillon soy sauce, and    cooking wine should be used in place of table salt 10. _____ On an ingredient list, a product which lists monosodium glutamate as the first   ingredient is an appropriate food to include on a low sodium diet  Circle the best answer(s) to the following statements (Hint: there may be more than one correct answer)  11. On a low-sodium diet, some acceptable snack items are:    A. Olives  F. Bean dip   K. Grapefruit juice    B. Salted Pretzels G. Commercial Popcorn   L. Canned peaches    C. Carrot Sticks  H. Bouillon   M. Unsalted nuts   D. [place] fries  I. Peanut butter crackers N. Salami   E. Sweet pickles J. Tomato Juice   O. Pizza  12.  Seasonings that may be used freely on a reduced - sodium diet include   A. Lemon wedges F.Monosodium glutamate K. Celery seed    B.Soysauce   G. Pepper   L. Mustard powder   C. Sea salt  H. Cooking wine  M. Onion flakes   D. Vinegar  E. Prepared horseradish N. Salsa   E. Sage   J. Worcestershire sauce  O. Chutney      Signed, Ermalinda Barrios, PA-C  03/31/2020 1:16 PM    [facility] [address], [place], [place]  [address] Phone: [phone]; Fax: [phone]

## 2020-03-31 ENCOUNTER — Ambulatory Visit: Payer: Medicare HMO | Admitting: Physician Assistant

## 2020-03-31 ENCOUNTER — Ambulatory Visit (HOSPITAL_COMMUNITY): Payer: Medicare HMO | Attending: Cardiology

## 2020-03-31 ENCOUNTER — Encounter: Payer: Self-pay | Admitting: Physician Assistant

## 2020-03-31 ENCOUNTER — Other Ambulatory Visit: Payer: Self-pay

## 2020-03-31 VITALS — BP 130/62 | HR 100 | Ht 64.0 in | Wt 135.0 lb

## 2020-03-31 DIAGNOSIS — I50810 Right heart failure, unspecified: Secondary | ICD-10-CM

## 2020-03-31 DIAGNOSIS — I3139 Other pericardial effusion (noninflammatory): Secondary | ICD-10-CM

## 2020-03-31 DIAGNOSIS — I313 Pericardial effusion (noninflammatory): Secondary | ICD-10-CM | POA: Insufficient documentation

## 2020-03-31 DIAGNOSIS — I2584 Coronary atherosclerosis due to calcified coronary lesion: Secondary | ICD-10-CM | POA: Diagnosis not present

## 2020-03-31 DIAGNOSIS — I82409 Acute embolism and thrombosis of unspecified deep veins of unspecified lower extremity: Secondary | ICD-10-CM

## 2020-03-31 DIAGNOSIS — D509 Iron deficiency anemia, unspecified: Secondary | ICD-10-CM | POA: Diagnosis not present

## 2020-03-31 DIAGNOSIS — R7303 Prediabetes: Secondary | ICD-10-CM | POA: Diagnosis not present

## 2020-03-31 DIAGNOSIS — I2510 Atherosclerotic heart disease of native coronary artery without angina pectoris: Secondary | ICD-10-CM | POA: Diagnosis not present

## 2020-03-31 LAB — ECHOCARDIOGRAM COMPLETE
Area-P 1/2: 6.17 cm2
S' Lateral: 2 cm

## 2020-03-31 MED ORDER — POTASSIUM CHLORIDE ER 10 MEQ PO TBCR: 10.0000 meq | EXTENDED_RELEASE_TABLET | Freq: Every day | ORAL | 3 refills | Status: DC

## 2020-03-31 MED ORDER — FUROSEMIDE 20 MG PO TABS: 20.0000 mg | ORAL_TABLET | Freq: Every day | ORAL | 3 refills | Status: DC

## 2020-03-31 NOTE — Addendum Note (Signed)
Addended by: Mendel Ryder on: 03/31/2020 02:13 PM   Modules accepted: Orders

## 2020-03-31 NOTE — Progress Notes (Signed)
Dr. Haroldine Laws and Nira Conn,  We are trying to get this patient in with you for pulmonary HTN workup and possible RHC. Can you help get her in sooner than later? Dr. Domenic Polite reviewed and advised based on echo today.   Thanks for your help, Selinda Eon

## 2020-03-31 NOTE — Patient Instructions (Addendum)
Medication Instructions:  1) Start Furosemide (Lasix) 20 mg daily, take 40 mg by mouth daily for 3 days then decrease to 20 mg daily   2) Start Potassium Chloride 10 meq daily, take 20 meq daily for 3 days then decrease to 10 meq daily   *If you need a refill on your cardiac medications before your next appointment, please call your pharmacy*   Lab Work: Pro Bnp, Bmp, Cbc- today   If you have labs (blood work) drawn today and your tests are completely normal, you will receive your results only by: Marland Kitchen MyChart Message (if you have MyChart) OR . A paper copy in the mail If you have any lab test that is abnormal or we need to change your treatment, we will call you to review the results.   Testing/Procedures: None ordered    Follow-Up: Follow up with Dr. Rozann Lesches on 04/05/2020 @ 3:00 PM   Your physician has referred you to see Dr. Kara Mead, MD at [facility]monary care   Other Instructions Two Gram Sodium Diet 2000 mg  What is Sodium? Sodium is a mineral found naturally in many foods. The most significant source of sodium in the diet is table salt, which is about 40% sodium.  Processed, convenience, and preserved foods also contain a large amount of sodium.  The body needs only 500 mg of sodium daily to function,  A normal diet provides more than enough sodium even if you do not use salt.  Why Limit Sodium? A build up of sodium in the body can cause thirst, increased blood pressure, shortness of breath, and water retention.  Decreasing sodium in the diet can reduce edema and risk of heart attack or stroke associated with high blood pressure.  Keep in mind that there are many other factors involved in these health problems.  Heredity, obesity, lack of exercise, cigarette smoking, stress and what you eat all play a role.  General Guidelines:  Do not add salt at the table or in cooking.  One teaspoon of salt contains over 2 grams of sodium.  Read food labels  Avoid processed  and convenience foods  Ask your dietitian before eating any foods not dicussed in the menu planning guidelines  Consult your physician if you wish to use a salt substitute or a sodium containing medication such as antacids.  Limit milk and milk products to 16 oz (2 cups) per day.  Shopping Hints:  READ LABELS!! "Dietetic" does not necessarily mean low sodium.  Salt and other sodium ingredients are often added to foods during processing.   Menu Planning Guidelines Food Group Choose More Often Avoid  Beverages (see also the milk group All fruit juices, low-sodium, salt-free vegetables juices, low-sodium carbonated beverages Regular vegetable or tomato juices, commercially softened water used for drinking or cooking  Breads and Cereals Enriched white, wheat, rye and pumpernickel bread, hard rolls and dinner rolls; muffins, cornbread and waffles; most dry cereals, cooked cereal without added salt; unsalted crackers and breadsticks; low sodium or homemade bread crumbs Bread, rolls and crackers with salted tops; quick breads; instant hot cereals; pancakes; commercial bread stuffing; self-rising flower and biscuit mixes; regular bread crumbs or cracker crumbs  Desserts and Sweets Desserts and sweets mad with mild should be within allowance Instant pudding mixes and cake mixes  Fats Butter or margarine; vegetable oils; unsalted salad dressings, regular salad dressings limited to 1 Tbs; light, sour and heavy cream Regular salad dressings containing bacon fat, bacon bits, and salt  pork; snack dips made with instant soup mixes or processed cheese; salted nuts  Fruits Most fresh, frozen and canned fruits Fruits processed with salt or sodium-containing ingredient (some dried fruits are processed with sodium sulfites        Vegetables Fresh, frozen vegetables and low- sodium canned vegetables Regular canned vegetables, sauerkraut, pickled vegetables, and others prepared in brine; frozen vegetables in  sauces; vegetables seasoned with ham, bacon or salt pork  Condiments, Sauces, Miscellaneous  Salt substitute with physician's approval; pepper, herbs, spices; vinegar, lemon or lime juice; hot pepper sauce; garlic powder, onion powder, low sodium soy sauce (1 Tbs.); low sodium condiments (ketchup, chili sauce, mustard) in limited amounts (1 tsp.) fresh ground horseradish; unsalted tortilla chips, pretzels, potato chips, popcorn, salsa (1/4 cup) Any seasoning made with salt including garlic salt, celery salt, onion salt, and seasoned salt; sea salt, rock salt, kosher salt; meat tenderizers; monosodium glutamate; mustard, regular soy sauce, barbecue, sauce, chili sauce, teriyaki sauce, steak sauce, Worcestershire sauce, and most flavored vinegars; canned gravy and mixes; regular condiments; salted snack foods, olives, picles, relish, horseradish sauce, catsup   Food preparation: Try these seasonings Meats:    Pork Sage, onion Serve with applesauce  Chicken Poultry seasoning, thyme, parsley Serve with cranberry sauce  Lamb Curry powder, rosemary, garlic, thyme Serve with mint sauce or jelly  Veal Marjoram, basil Serve with current jelly, cranberry sauce  Beef Pepper, bay leaf Serve with dry mustard, unsalted chive butter  Fish [place] leaf, dill Serve with unsalted lemon butter, unsalted parsley butter  Vegetables:    Asparagus Lemon juice   Broccoli Lemon juice   Carrots Mustard dressing parsley, mint, nutmeg, glazed with unsalted butter and sugar   Green beans Marjoram, lemon juice, nutmeg,dill seed   Tomatoes Basil, marjoram, onion   Spice /blend for [employer]" 4 tsp ground thyme 1 tsp ground sage 3 tsp ground rosemary 4 tsp ground marjoram   Test your knowledge 1. A product that says "Salt Free" may still contain sodium. True or False 2. Garlic Powder and Hot Pepper Sauce an be used as alternative seasonings.True or False 3. Processed foods have more sodium than fresh foods.  True or  False 4. Canned Vegetables have less sodium than froze True or False  WAYS TO DECREASE YOUR SODIUM INTAKE 1. Avoid the use of added salt in cooking and at the table.  Table salt (and other prepared seasonings which contain salt) is probably one of the greatest sources of sodium in the diet.  Unsalted foods can gain flavor from the sweet, sour, and butter taste sensations of herbs and spices.  Instead of using salt for seasoning, try the following seasonings with the foods listed.  Remember: how you use them to enhance natural food flavors is limited only by your creativity... Allspice-Meat, fish, eggs, fruit, peas, red and yellow vegetables Almond Extract-Fruit baked goods Anise Seed-Sweet breads, fruit, carrots, beets, cottage cheese, cookies (tastes like licorice) Basil-Meat, fish, eggs, vegetables, rice, vegetables salads, soups, sauces Bay Leaf-Meat, fish, stews, poultry Burnet-Salad, vegetables (cucumber-like flavor) Caraway Seed-Bread, cookies, cottage cheese, meat, vegetables, cheese, rice Cardamon-Baked goods, fruit, soups Celery Powder or seed-Salads, salad dressings, sauces, meatloaf, soup, bread.Do not use  celery salt Chervil-Meats, salads, fish, eggs, vegetables, cottage cheese (parsley-like flavor) Chili Power-Meatloaf, chicken cheese, corn, eggplant, egg dishes Chives-Salads cottage cheese, egg dishes, soups, vegetables, sauces Cilantro-Salsa, casseroles Cinnamon-Baked goods, fruit, pork, lamb, chicken, carrots Cloves-Fruit, baked goods, fish, pot roast, green beans, beets, carrots Coriander-Pastry, cookies, meat, salads,  cheese (lemon-orange flavor) Cumin-Meatloaf, fish,cheese, eggs, cabbage,fruit pie (caraway flavor) [employer], fruit, eggs, fish, poultry, cottage cheese, vegetables Dill Seed-Meat, cottage cheese, poultry, vegetables, fish, salads, bread Fennel Seed-Bread, cookies, apples, pork, eggs, fish, beets, cabbage, cheese, Licorice-like flavor Garlic-(buds or  powder) Salads, meat, poultry, fish, bread, butter, vegetables, potatoes.Do not  use garlic salt Ginger-Fruit, vegetables, baked goods, meat, fish, poultry Horseradish Root-Meet, vegetables, butter Lemon Juice or Extract-Vegetables, fruit, tea, baked goods, fish salads Mace-Baked goods fruit, vegetables, fish, poultry (taste like nutmeg) Maple Extract-Syrups Marjoram-Meat, chicken, fish, vegetables, breads, green salads (taste like Sage) Mint-Tea, lamb, sherbet, vegetables, desserts, carrots, cabbage Mustard, Dry or Seed-Cheese, eggs, meats, vegetables, poultry Nutmeg-Baked goods, fruit, chicken, eggs, vegetables, desserts Onion Powder-Meat, fish, poultry, vegetables, cheese, eggs, bread, rice salads (Do not use   Onion salt) Orange Extract-Desserts, baked goods Oregano-Pasta, eggs, cheese, onions, pork, lamb, fish, chicken, vegetables, green salads Paprika-Meat, fish, poultry, eggs, cheese, vegetables Parsley Flakes-Butter, vegetables, meat fish, poultry, eggs, bread, salads (certain forms may   Contain sodium Pepper-Meat fish, poultry, vegetables, eggs Peppermint Extract-Desserts, baked goods Poppy Seed-Eggs, bread, cheese, fruit dressings, baked goods, noodles, vegetables, cottage  [employer], poultry, meat, fish, cauliflower, turnips,eggs bread Saffron-Rice, bread, veal, chicken, fish, eggs Sage-Meat, fish, poultry, onions, eggplant, tomateos, pork, stews Savory-Eggs, salads, poultry, meat, rice, vegetables, soups, pork Tarragon-Meat, poultry, fish, eggs, butter, vegetables (licorice-like flavor)  Thyme-Meat, poultry, fish, eggs, vegetables, (clover-like flavor), sauces, soups Tumeric-Salads, butter, eggs, fish, rice, vegetables (saffron-like flavor) Vanilla Extract-Baked goods, candy Vinegar-Salads, vegetables, meat marinades Walnut Extract-baked goods, candy  2. Choose your Foods Wisely   The following is a list of foods to avoid which  are high in sodium:  Meats-Avoid all smoked, canned, salt cured, dried and kosher meat and fish as well as Anchovies   [employer] meats:Bologna, Liverwurst, Pastrami Canned meat or fish  Marinated herring Caviar    Pepperoni Corned Beef   Pizza Dried chipped beef  Salami Frozen breaded fish or meat Salt pork Frankfurters or hot dogs  Sardines Gefilte fish   Sausage Ham (boiled ham, Proscuitto Smoked butt    spiced ham)   Spam      TV Dinners Vegetables Canned vegetables (Regular) Relish Canned mushrooms  Sauerkraut Olives    Tomato juice Pickles  Bakery and Dessert Products Canned puddings  Cream pies Cheesecake   Decorated cakes Cookies  Beverages/Juices Tomato juice, regular  Gatorade   V-8 vegetable juice, regular  Breads and Cereals Biscuit mixes   Salted potato chips, corn chips, pretzels Bread stuffing mixes  Salted crackers and rolls Pancake and waffle mixes Self-rising flour  Seasonings Accent    Meat sauces Barbecue sauce  Meat tenderizer Catsup    Monosodium glutamate (MSG) Celery salt   Onion salt Chili sauce   Prepared mustard Garlic salt   Salt, seasoned salt, sea salt Gravy mixes   Soy sauce Horseradish   Steak sauce Ketchup   Tartar sauce Lite salt    Teriyaki sauce Marinade mixes   Worcestershire sauce  Others Baking powder   Cocoa and cocoa mixes Baking soda   Commercial casserole mixes Candy-caramels, chocolate  Dehydrated soups    Bars, fudge,nougats  Instant rice and pasta mixes Canned broth or soup  Maraschino cherries Cheese, aged and processed cheese and cheese spreads  Learning Assessment Quiz  Indicated T (for True) or F (for False) for each of the following statements:  1. _____ Fresh fruits and vegetables and unprocessed grains are generally  low in sodium 2. _____ Water may contain a considerable amount of sodium, depending on the source 3. _____ You can always tell if a food is high in sodium by tasting  it 4. _____ Certain laxatives my be high in sodium and should be avoided unless prescribed   by a physician or pharmacist 5. _____ Salt substitutes may be used freely by anyone on a sodium restricted diet 6. _____ Sodium is present in table salt, food additives and as a natural component of   most foods 7. _____ Table salt is approximately 90% sodium 8. _____ Limiting sodium intake may help prevent excess fluid accumulation in the body 9. _____ On a sodium-restricted diet, seasonings such as bouillon soy sauce, and    cooking wine should be used in place of table salt 10. _____ On an ingredient list, a product which lists monosodium glutamate as the first   ingredient is an appropriate food to include on a low sodium diet  Circle the best answer(s) to the following statements (Hint: there may be more than one correct answer)  11. On a low-sodium diet, some acceptable snack items are:    A. Olives  F. Bean dip   K. Grapefruit juice    B. Salted Pretzels G. Commercial Popcorn   L. Canned peaches    C. Carrot Sticks  H. Bouillon   M. Unsalted nuts   D. [place] fries  I. Peanut butter crackers N. Salami   E. Sweet pickles J. Tomato Juice   O. Pizza  12.  Seasonings that may be used freely on a reduced - sodium diet include   A. Lemon wedges F.Monosodium glutamate K. Celery seed    B.Soysauce   G. Pepper   L. Mustard powder   C. Sea salt  H. Cooking wine  M. Onion flakes   D. Vinegar  E. Prepared horseradish N. Salsa   E. Sage   J. Worcestershire sauce  O. Chutney

## 2020-03-31 NOTE — Addendum Note (Signed)
Addended by: Mendel Ryder on: 03/31/2020 03:02 PM   Modules accepted: Orders

## 2020-03-31 NOTE — Addendum Note (Signed)
Addended by: Mendel Ryder on: 03/31/2020 01:30 PM   Modules accepted: Orders

## 2020-04-01 ENCOUNTER — Telehealth: Payer: Self-pay | Admitting: Cardiology

## 2020-04-01 LAB — CBC
Hematocrit: 28.3 % — ABNORMAL LOW (ref 34.0–46.6)
Hemoglobin: 8.4 g/dL — ABNORMAL LOW (ref 11.1–15.9)
MCH: 22.3 pg — ABNORMAL LOW (ref 26.6–33.0)
MCHC: 29.7 g/dL — ABNORMAL LOW (ref 31.5–35.7)
MCV: 75 fL — ABNORMAL LOW (ref 79–97)
Platelets: 392 10*3/uL (ref 150–450)
RBC: 3.76 x10E6/uL — ABNORMAL LOW (ref 3.77–5.28)
RDW: 15.2 % (ref 11.7–15.4)
WBC: 6.2 10*3/uL (ref 3.4–10.8)

## 2020-04-01 LAB — BASIC METABOLIC PANEL
BUN/Creatinine Ratio: 14 (ref 12–28)
BUN: 12 mg/dL (ref 8–27)
CO2: 19 mmol/L — ABNORMAL LOW (ref 20–29)
Calcium: 8.5 mg/dL — ABNORMAL LOW (ref 8.7–10.3)
Chloride: 105 mmol/L (ref 96–106)
Creatinine, Ser: 0.85 mg/dL (ref 0.57–1.00)
Glucose: 104 mg/dL — ABNORMAL HIGH (ref 65–99)
Potassium: 4.8 mmol/L (ref 3.5–5.2)
Sodium: 141 mmol/L (ref 134–144)
eGFR: 73 mL/min/{1.73_m2} (ref >59)

## 2020-04-01 LAB — PRO B NATRIURETIC PEPTIDE: NT-Pro BNP: 398 pg/mL — ABNORMAL HIGH (ref 0–301)

## 2020-04-01 LAB — HEMOGLOBIN A1C
Est. average glucose Bld gHb Est-mCnc: 131 mg/dL
Hgb A1c MFr Bld: 6.2 % — ABNORMAL HIGH (ref 4.8–5.6)

## 2020-04-01 NOTE — Telephone Encounter (Signed)
Advised patient that both appointments are needed.

## 2020-04-01 NOTE — Telephone Encounter (Signed)
[name] -daughter called in regards to patient having 2 upcoming appointments. One with Dr. Domenic Polite and the other one with Dr. Sung Amabile. Does patient actually need both appointments?

## 2020-04-05 ENCOUNTER — Ambulatory Visit: Payer: Medicare HMO | Admitting: Cardiology

## 2020-04-05 ENCOUNTER — Encounter: Payer: Self-pay | Admitting: Cardiology

## 2020-04-05 VITALS — BP 136/64 | HR 107 | Wt 134.6 lb

## 2020-04-05 DIAGNOSIS — I313 Pericardial effusion (noninflammatory): Secondary | ICD-10-CM | POA: Diagnosis not present

## 2020-04-05 DIAGNOSIS — I3139 Other pericardial effusion (noninflammatory): Secondary | ICD-10-CM

## 2020-04-05 DIAGNOSIS — I272 Pulmonary hypertension, unspecified: Secondary | ICD-10-CM | POA: Diagnosis not present

## 2020-04-05 NOTE — Patient Instructions (Addendum)
Medication Instructions:   Your physician recommends that you continue on your current medications as directed. Please refer to the Current Medication list given to you today.  Labwork:  none  Testing/Procedures:  none  Follow-Up:  Your physician recommends that you schedule a follow-up appointment in: after treatment and plan with Dr. Sung Amabile is complete.  Any Other Special Instructions Will Be Listed Below (If Applicable).  If you need a refill on your cardiac medications before your next appointment, please call your pharmacy.

## 2020-04-05 NOTE — Progress Notes (Signed)
Cardiology Office Note  Date: 04/05/2020   ID: Jacqueline Harris, Jacqueline Harris, Jacqueline Harris, MRN [id number]  PCP:  Jacqueline Chroman, MD  Cardiologist:  Jacqueline Lesches, MD Electrophysiologist:  None   Chief Complaint  Patient presents with  . Cardiac follow-up    History of Present Illness: Jacqueline Harris is a 75 y.o. female patient of Dr. Meda Harris, seen just last week by Ms. Jacqueline Harris, now presenting to establish follow-up with me.  I reviewed her records and updated the chart. She is here today with her daughter.  History includes COVID-19 pneumonia diagnosed in November 2021.  Cardiology was involved in her care at that time with finding of a small pericardial effusion, also moderate RV dysfunction with severely elevated estimated RVSP of 68 mmHg.  It was suspected that this was related to acute pulmonary process at that point.  She also had an acute left peroneal vein DVT, treated with Eliquis.  Chest CTA at that time showed no obvious pulmonary embolus. She had follow-up recently with Ms. Jacqueline Public PA-C to review follow-up echocardiogram.  Echocardiogram performed on March 16 reported LVEF 60 to 65% with mild LVH, mildly enlarged right ventricle with mildly reduced contraction, severe pulmonary hypertension with estimated RVSP 94 mmHg, moderate pericardial effusion.  She does have a prior history of tobacco use but quit several years ago, no obvious COPD.  She has not undergone any pulmonary function tests.  No known history of collagen vascular disease.  She does not describe any cough or hemoptysis.  Has minor lower leg residual edema on the left.  She is on low-dose diuretics and has continued on Eliquis.  At my direction she has already been referred for evaluation by Dr. Haroldine Harris and further assessment of pulmonary hypertension.  Visit is scheduled for this week.  Past Medical History:  Diagnosis Date  . Acute deep vein thrombosis (DVT) of left peroneal vein [facility])    November 2021  . Essential  hypertension   . Pneumonia due to COVID-19 virus    November 2021  . Prediabetes     Past Surgical History:  Procedure Laterality Date  . CHOLECYSTECTOMY    . COLONOSCOPY N/A 12/03/2014   Procedure: COLONOSCOPY;  Surgeon: Rogene Houston, MD;  Location: [facility];  Service: Endoscopy;  Laterality: N/A;  830  . COLONOSCOPY WITH PROPOFOL N/A 03/10/2020   Procedure: COLONOSCOPY WITH PROPOFOL;  Surgeon: Rogene Houston, MD;  Location: [facility];  Service: Endoscopy;  Laterality: N/A;  1:15  . POLYPECTOMY  03/10/2020   Procedure: POLYPECTOMY;  Surgeon: Rogene Houston, MD;  Location: [facility];  Service: Endoscopy;;    Current Outpatient Medications  Medication Sig Dispense Refill  . acetaminophen (TYLENOL) 650 MG CR tablet Take 650 mg by mouth every 8 (eight) hours as needed for pain.    Marland Kitchen apixaban (ELIQUIS) 5 MG TABS tablet Take 1 tablet (5 mg total) by mouth 2 (two) times daily. 60 tablet 0  . Ferrous Sulfate (SLOW FE) 142 (45 Fe) MG TBCR Take 142 mg by mouth daily.    . furosemide (LASIX) 20 MG tablet Take 1 tablet (20 mg total) by mouth daily. take 40 mg by mouth daily for 3 days then decrease to 20 mg daily 90 tablet 3  . metoprolol succinate (TOPROL XL) 25 MG 24 hr tablet Take 1 tablet (25 mg total) by mouth daily. (Patient taking differently: Take 12.5 mg by mouth daily as needed (If blood pressure is 114  or under do not take).) 90 tablet 3  . potassium chloride (KLOR-CON) Harris MEQ tablet Take 1 tablet (Harris mEq total) by mouth daily. take 20 meq by mouth daily for 3 days then decrease to Harris meq daily 90 tablet 3  . rosuvastatin (CRESTOR) 5 MG tablet Take 5 mg by mouth every Friday.     No current facility-administered medications for this visit.   Allergies:  Patient has no active allergies.   Social History: The patient  reports that she has quit smoking. She has never used smokeless tobacco. She reports that she does not drink alcohol and does not use drugs.   Family  History: The patient's family history includes Cancer in her father; Cancer - Colon in her brother; Cancer - Lung in her mother; Diabetes in her sister; Hypertension in her sister.   ROS: No palpitations, no syncope.  Physical Exam: VS:  BP 136/64 (BP Location: Right Arm, Patient Position: Sitting, Cuff Size: Normal)   Pulse (!) 107   Wt 134 lb 9.6 oz (61.1 kg)   SpO2 99%   BMI 23.Harris kg/m , BMI Body mass index is 23.1 kg/m.  Wt Readings from Last 3 Encounters:  04/05/20 134 lb 9.6 oz (61.1 kg)  03/31/20 135 lb (61.2 kg)  03/08/20 135 lb (61.2 kg)    General: Patient appears comfortable at rest. HEENT: Conjunctiva and lids normal, wearing a mask. Neck: Supple, no elevated JVP or carotid bruits, no thyromegaly. Lungs: Clear to auscultation, nonlabored breathing at rest. Cardiac: Regular rate and rhythm, P2 prominent, no S3, soft apical systolic murmur, no pericardial rub. Extremities: No pitting edema, distal pulses 2+. Skin: Warm and dry. Musculoskeletal: No kyphosis. Neuropsychiatric: Alert and oriented x3, affect grossly appropriate.  ECG:  An ECG dated 12/17/2019 was personally reviewed today and demonstrated:  Sinus tachycardia with left anterior fascicular block, decreased R wave progression.  Recent Labwork: 11/21/2019: B Natriuretic Peptide 492.4 11/22/2019: TSH 6.024 03/16/2020: ALT 8; AST 13 03/31/2020: BUN 12; Creatinine, Ser 0.85; Hemoglobin 8.4; NT-Pro BNP 398; Platelets 392; Potassium 4.8; Sodium 141     Component Value Date/Time   CHOL 136 03/16/2020 0956   TRIG 99 03/16/2020 0956   HDL 25 (L) 03/16/2020 0956   CHOLHDL 5.4 (H) 03/16/2020 0956   CHOLHDL 8.8 11/23/2019 0715   VLDL 19 11/23/2019 0715   Lyman 92 03/16/2020 0956    Other Studies Reviewed Today:  Chest CTA 11/21/2019: IMPRESSION: No central, segmental, or subsegmental pulmonary embolism.  Moderate pericardial effusion with mild cardiomegaly. No evidence of right ventricular heart  strain.  Multifocal patchy airspace opacities, predominantly within both lung bases which may be due to infectious etiology.  Trace left pleural effusion.  Aortic Atherosclerosis (ICD10-I70.0).  Echocardiogram 03/31/2020: 1. Left ventricular ejection fraction, by estimation, is 60 to 65%. The  left ventricle has normal function. The left ventricle has no regional  wall motion abnormalities. There is mild left ventricular hypertrophy.  Left ventricular diastolic parameters  are indeterminate. There is the interventricular septum is flattened in  systole and diastole, consistent with right ventricular pressure and  volume overload.  2. Right ventricular systolic function is mildly reduced. The right  ventricular size is mildly enlarged. There is severely elevated pulmonary  artery systolic pressure. The estimated right ventricular systolic  pressure is 49.7 mmHg.  3. Right atrial size was mildly dilated.  4. Moderate pericardial effusion. There is no evidence of cardiac  tamponade.  5. The mitral valve is normal in structure. No  evidence of mitral valve  regurgitation.  6. The tricuspid valve is abnormal. Tricuspid valve regurgitation is  moderate.  7. The aortic valve is tricuspid. Aortic valve regurgitation is not  visualized. No aortic stenosis is present.  8. The inferior vena cava is normal in size with greater than 50%  respiratory variability, suggesting right atrial pressure of 3 mmHg.   Assessment and Plan:  1.  Severe pulmonary hypertension, estimated RVSP 68 mmHg by echocardiogram in November 2021 and most recently measured at 5 mmHg by follow-up echocardiogram.  She had COVID-19 pneumonia as well as an acute left peroneal vein DVT although no pulmonary embolus by chest CTA in November 2021.  She has been on Eliquis since then.  Previous history of tobacco use but not for several years, no known emphysema, she has not had pulmonary function tests or a sleep  study.  Not hypoxic at baseline.  No known collagen vascular disease.  LV function normal with indeterminate diastolic parameters and no substantial left-sided valvular heart disease.  She has been referred to see Dr. Haroldine Harris for further work-up of pulmonary hypertension.  WHO classification uncertain at this point but wouldconsider class 4 with risk for chronic thromboembolic disease although no pulmonary embolus by chest CTA in November 2021, less likely group 2, and possibly group 1.  Anticipate right heart catheterization, additional serologies, PFTs, and possibly VQ scan.  2.  Pericardial effusion, moderate by recent follow-up echocardiogram. She is asymptomatic.  Medication Adjustments/Labs and Tests Ordered: Current medicines are reviewed at length with the patient today.  Concerns regarding medicines are outlined above.   Tests Ordered: No orders of the defined types were placed in this encounter.   Medication Changes: No orders of the defined types were placed in this encounter.   Disposition:  Follow up for consultation with Dr. Haroldine Harris, can follow-up in the [facility] practice once diagnosis confirmed and stabilized.  Signed, Satira Sark, MD, [facility] 04/05/2020 3:38 PM    [facility] [place], [place], [place] [address] Phone: [phone]; Fax: [phone]

## 2020-04-06 NOTE — H&P (View-Only) (Signed)
ADVANCED HF CLINIC CONSULT NOTE  Referring Physician:Samuel Domenic Polite, MD Primary Care: Glenda Chroman, MD Primary Cardiologist: Rozann Lesches, MD   HPI:  Jacqueline Harris is a 75 y.o. female with h/o HTN, tobacco use  referred by Dr. Domenic Polite for further evaluation of pulmonary HTN.   History includes COVID-19 in 9/21 (unvaccinated) than admitted to [facility] in 11/21 with PNA. Cardiology was involved in her care at that time with finding of a small pericardial effusion, also moderate RV dysfunction with severely elevated estimated RVSP of 68 mmHg.LVEF was normal.  It was suspected that this was related to acute pulmonary process at that point.  She also had an acute left peroneal vein DVT, treated with Eliquis.  Chest CTA at that time showed no obvious pulmonary embolus.  She saw Estella Husk in f/u. Echo 03/31/20 EF 60-65% Mildly dilated RV with mild RV dysfunction. Moderate TR. RVSP 34mHG. Moderate pericardial effusion   Smoked for about 13 years < 1ppd in her 257s Worked at [employer] No environmental exposures. No personal or family h/o CTD. Does have Raynaud's.   Here with her daughter. Previously very healthy with no medical problems. Can do ADLs but gets SOB easily. Mild edema RLE (where she had DVT). No dizziness.   Review of Systems: [y] = yes, _0  = no   General: Weight gain _1 ; Weight loss _2 ; Anorexia _3 ; Fatigue _4 ; Fever _5 ; Chills _6 ; Weakness _7   Cardiac: Chest pain/pressure _8 ; Resting SOB _9 ; Exertional SOB [Blue.Reese]; Orthopnea _10 ; Pedal Edema _11 ; Palpitations _12 ; Syncope _13 ; Presyncope _14 ; Paroxysmal nocturnal dyspnea_15   Pulmonary: Cough _16 ; Wheezing_17 ; Hemoptysis_18 ; Sputum _19 ; Snoring _20   GI: Vomiting_21 ; Dysphagia_22 ; Melena_23 ; Hematochezia _24 ; Heartburn_25 ; Abdominal pain _26 ; Constipation _27 ; Diarrhea _28 ; BRBPR _29   GU: Hematuria_30 ; Dysuria _31 ; Nocturia_32   Vascular: Pain in legs with walking _33 ; Pain in feet with lying flat _34 ; Non-healing sores  _35 ; Stroke _36 ; TIA _37 ; Slurred speech _38 ;  Neuro: Headaches_39 ; Vertigo_40 ; Seizures_41 ; Paresthesias_42 ;Blurred vision _43 ; Diplopia _44 ; Vision changes _45   Ortho/Skin: Arthritis [Blue.Reese]; Joint pain [Blue.Reese]; Muscle pain _46 ; Joint swelling _47 ; Back Pain _48 ; Rash _49   Psych: Depression_50 ; Anxiety_51   Heme: Bleeding problems _52 ; Clotting disorders _53 ; Anemia _54   Endocrine: Diabetes _55 ; Thyroid dysfunction_56    Past Medical History:  Diagnosis Date  . Acute deep vein thrombosis (DVT) of left peroneal vein ([facility]    November 2021  . CH[place] (congestive heart failure) [place]   . Essential hypertension   . Pneumonia due to COVID-19 virus    November 2021  . Prediabetes     Current Outpatient Medications  Medication Sig Dispense Refill  . acetaminophen (TYLENOL) 650 MG CR tablet Take 650 mg by mouth every 8 (eight) hours as needed for pain.    .Marland Kitchenapixaban (ELIQUIS) 5 MG TABS tablet Take 1 tablet (5 mg total) by mouth 2 (two) times daily. 60 tablet 0  . Ferrous Sulfate (SLOW FE) 142 (45 Fe) MG TBCR Take 142 mg by mouth daily.    . furosemide (LASIX) 20 MG tablet Take 20 mg by mouth daily.    . metoprolol tartrate (LOPRESSOR) 25 MG tablet Take 12.5 mg by mouth  daily as needed. Hold for bp less than 114    . potassium chloride (KLOR-CON) 10 MEQ tablet Take 10 mEq by mouth daily.    . rosuvastatin (CRESTOR) 5 MG tablet Take 5 mg by mouth every Friday.     No current facility-administered medications for this encounter.    No Active Allergies    Social History   Socioeconomic History  . Marital status: Divorced    Spouse name: Not on file  . Number of children: Not on file  . Years of education: Not on file  . Highest education level: Not on file  Occupational History  . Not on file  Tobacco Use  . Smoking status: Former Research scientist (life sciences)  . Smokeless tobacco: Never Used  Vaping Use  . Vaping Use: Never used  Substance and Sexual Activity  . Alcohol use: No  . Drug use: No  . Sexual  activity: Not on file  Other Topics Concern  . Not on file  Social History Narrative  . Not on file   Social Determinants of Health   Financial Resource Strain: Not on file  Food Insecurity: Not on file  Transportation Needs: Not on file  Physical Activity: Not on file  Stress: Not on file  Social Connections: Not on file  Intimate Partner Violence: Not on file      Family History  Problem Relation Age of Onset  . Cancer - Lung Mother   . Cancer Father   . Cancer - Colon Brother   . Diabetes Sister   . Hypertension Sister     Vitals:   04/07/20 1036  BP: (!) 120/58  Pulse: 99  SpO2: 99%  Weight: 60.9 kg (134 lb 3.2 oz)    PHYSICAL EXAM: General:  Well appearing. No respiratory difficulty HEENT: normal Neck: supple. JVP 8  Carotids 2+ bilat; no bruits. No lymphadenopathy or thryomegaly appreciated. Cor: PMI nondisplaced. Regular rate & rhythm. No rubs, gallops or murmurs. Lungs: clear Abdomen: soft, nontender, nondistended. No hepatosplenomegaly. No bruits or masses. Good bowel sounds. Extremities: no cyanosis, clubbing, rash, edema Neuro: alert & oriented x 3, cranial nerves grossly intact. moves all 4 extremities w/o difficulty. Affect pleasant.  ASSESSMENT & PLAN:  1. PAH - Echo 03/31/20 EF 60-65% Mildly dilated RV with mild RV dysfunction. Moderate TR. RVSP 59mHG. Moderate pericardial effusion  - h/o COVID 19 infection 11/21 - Acute left peroneal vein DVT although no pulmonary embolus by chest CTA in November 2021. - Unclear if this is WHO Group 1 or 4 disease. I lean toward WHO Group I  - arrange PFTs (pedning with Dr. AElsworth Sohoin 4/14), CTD serologies, VQ and RHC +/-pulmonary angiograms (if VQ positive)  2. Pericardial effusion - moderate by echo 3/22 - likely due to elevated coronary sinus/RA pressures - should improve with treatment of PH. Continue lasix   DGlori Bickers MD  11:11 AM

## 2020-04-06 NOTE — Progress Notes (Signed)
ADVANCED HF CLINIC CONSULT NOTE  Referring Physician:Samuel Domenic Polite, MD Primary Care: Glenda Chroman, MD Primary Cardiologist: Rozann Lesches, MD   HPI:  Jacqueline Harris is a 75 y.o. female with h/o HTN, tobacco use  referred by Dr. Domenic Polite for further evaluation of pulmonary HTN.   History includes COVID-19 in 9/21 (unvaccinated) than admitted to [facility] in 11/21 with PNA. Cardiology was involved in her care at that time with finding of a small pericardial effusion, also moderate RV dysfunction with severely elevated estimated RVSP of 68 mmHg.LVEF was normal.  It was suspected that this was related to acute pulmonary process at that point.  She also had an acute left peroneal vein DVT, treated with Eliquis.  Chest CTA at that time showed no obvious pulmonary embolus.  She saw Estella Husk in f/u. Echo 03/31/20 EF 60-65% Mildly dilated RV with mild RV dysfunction. Moderate TR. RVSP 34mHG. Moderate pericardial effusion   Smoked for about 13 years < 1ppd in her 89s Worked at [employer] No environmental exposures. No personal or family h/o CTD. Does have Raynaud's.   Here with her daughter. Previously very healthy with no medical problems. Can do ADLs but gets SOB easily. Mild edema RLE (where she had DVT). No dizziness.   Review of Systems: [y] = yes, _0  = no   General: Weight gain _1 ; Weight loss _2 ; Anorexia _3 ; Fatigue _4 ; Fever _5 ; Chills _6 ; Weakness _7   Cardiac: Chest pain/pressure _8 ; Resting SOB _9 ; Exertional SOB [Blue.Reese]; Orthopnea _10 ; Pedal Edema _11 ; Palpitations _12 ; Syncope _13 ; Presyncope _14 ; Paroxysmal nocturnal dyspnea_15   Pulmonary: Cough _16 ; Wheezing_17 ; Hemoptysis_18 ; Sputum _19 ; Snoring _20   GI: Vomiting_21 ; Dysphagia_22 ; Melena_23 ; Hematochezia _24 ; Heartburn_25 ; Abdominal pain _26 ; Constipation _27 ; Diarrhea _28 ; BRBPR _29   GU: Hematuria_30 ; Dysuria _31 ; Nocturia_32   Vascular: Pain in legs with walking _33 ; Pain in feet with lying flat _34 ; Non-healing sores  _35 ; Stroke _36 ; TIA _37 ; Slurred speech _38 ;  Neuro: Headaches_39 ; Vertigo_40 ; Seizures_41 ; Paresthesias_42 ;Blurred vision _43 ; Diplopia _44 ; Vision changes _45   Ortho/Skin: Arthritis [Blue.Reese]; Joint pain [Blue.Reese]; Muscle pain _46 ; Joint swelling _47 ; Back Pain _48 ; Rash _49   Psych: Depression_50 ; Anxiety_51   Heme: Bleeding problems _52 ; Clotting disorders _53 ; Anemia _54   Endocrine: Diabetes _55 ; Thyroid dysfunction_56    Past Medical History:  Diagnosis Date  . Acute deep vein thrombosis (DVT) of left peroneal vein ([facility]    November 2021  . CHF (congestive heart failure) ([facility]   . Essential hypertension   . Pneumonia due to COVID-19 virus    November 2021  . Prediabetes     Current Outpatient Medications  Medication Sig Dispense Refill  . acetaminophen (TYLENOL) 650 MG CR tablet Take 650 mg by mouth every 8 (eight) hours as needed for pain.    .Marland Kitchenapixaban (ELIQUIS) 5 MG TABS tablet Take 1 tablet (5 mg total) by mouth 2 (two) times daily. 60 tablet 0  . Ferrous Sulfate (SLOW FE) 142 (45 Fe) MG TBCR Take 142 mg by mouth daily.    . furosemide (LASIX) 20 MG tablet Take 20 mg by mouth daily.    . metoprolol tartrate (LOPRESSOR) 25 MG tablet Take 12.5 mg by mouth  daily as needed. Hold for bp less than 114    . potassium chloride (KLOR-CON) 10 MEQ tablet Take 10 mEq by mouth daily.    . rosuvastatin (CRESTOR) 5 MG tablet Take 5 mg by mouth every Friday.     No current facility-administered medications for this encounter.    No Active Allergies    Social History   Socioeconomic History  . Marital status: Divorced    Spouse name: Not on file  . Number of children: Not on file  . Years of education: Not on file  . Highest education level: Not on file  Occupational History  . Not on file  Tobacco Use  . Smoking status: Former Research scientist (life sciences)  . Smokeless tobacco: Never Used  Vaping Use  . Vaping Use: Never used  Substance and Sexual Activity  . Alcohol use: No  . Drug use: No  . Sexual  activity: Not on file  Other Topics Concern  . Not on file  Social History Narrative  . Not on file   Social Determinants of Health   Financial Resource Strain: Not on file  Food Insecurity: Not on file  Transportation Needs: Not on file  Physical Activity: Not on file  Stress: Not on file  Social Connections: Not on file  Intimate Partner Violence: Not on file      Family History  Problem Relation Age of Onset  . Cancer - Lung Mother   . Cancer Father   . Cancer - Colon Brother   . Diabetes Sister   . Hypertension Sister     Vitals:   04/07/20 1036  BP: (!) 120/58  Pulse: 99  SpO2: 99%  Weight: 60.9 kg (134 lb 3.2 oz)    PHYSICAL EXAM: General:  Well appearing. No respiratory difficulty HEENT: normal Neck: supple. JVP 8  Carotids 2+ bilat; no bruits. No lymphadenopathy or thryomegaly appreciated. Cor: PMI nondisplaced. Regular rate & rhythm. No rubs, gallops or murmurs. Lungs: clear Abdomen: soft, nontender, nondistended. No hepatosplenomegaly. No bruits or masses. Good bowel sounds. Extremities: no cyanosis, clubbing, rash, edema Neuro: alert & oriented x 3, cranial nerves grossly intact. moves all 4 extremities w/o difficulty. Affect pleasant.  ASSESSMENT & PLAN:  1. PAH - Echo 03/31/20 EF 60-65% Mildly dilated RV with mild RV dysfunction. Moderate TR. RVSP 59mHG. Moderate pericardial effusion  - h/o COVID 19 infection 11/21 - Acute left peroneal vein DVT although no pulmonary embolus by chest CTA in November 2021. - Unclear if this is WHO Group 1 or 4 disease. I lean toward WHO Group I  - arrange PFTs (pedning with Dr. AElsworth Sohoin 4/14), CTD serologies, VQ and RHC +/-pulmonary angiograms (if VQ positive)  2. Pericardial effusion - moderate by echo 3/22 - likely due to elevated coronary sinus/RA pressures - should improve with treatment of PH. Continue lasix   DGlori Bickers MD  11:11 AM

## 2020-04-07 ENCOUNTER — Ambulatory Visit (HOSPITAL_COMMUNITY)
Admission: RE | Admit: 2020-04-07 | Discharge: 2020-04-07 | Disposition: A | Discharge status: Home / Self Care | Payer: Medicare HMO | Source: Ambulatory Visit | Attending: Internal Medicine | Admitting: Internal Medicine

## 2020-04-07 ENCOUNTER — Other Ambulatory Visit: Payer: Self-pay

## 2020-04-07 ENCOUNTER — Encounter (HOSPITAL_COMMUNITY): Payer: Self-pay | Admitting: Internal Medicine

## 2020-04-07 VITALS — BP 120/58 | HR 99 | Wt 134.2 lb

## 2020-04-07 DIAGNOSIS — R079 Chest pain, unspecified: Secondary | ICD-10-CM | POA: Diagnosis not present

## 2020-04-07 DIAGNOSIS — Z7901 Long term (current) use of anticoagulants: Secondary | ICD-10-CM | POA: Diagnosis not present

## 2020-04-07 DIAGNOSIS — I272 Pulmonary hypertension, unspecified: Secondary | ICD-10-CM | POA: Diagnosis not present

## 2020-04-07 DIAGNOSIS — Z86718 Personal history of other venous thrombosis and embolism: Secondary | ICD-10-CM | POA: Insufficient documentation

## 2020-04-07 DIAGNOSIS — Z8616 Personal history of COVID-19: Secondary | ICD-10-CM | POA: Insufficient documentation

## 2020-04-07 DIAGNOSIS — Z79899 Other long term (current) drug therapy: Secondary | ICD-10-CM | POA: Diagnosis not present

## 2020-04-07 DIAGNOSIS — I313 Pericardial effusion (noninflammatory): Secondary | ICD-10-CM | POA: Diagnosis not present

## 2020-04-07 DIAGNOSIS — I3139 Other pericardial effusion (noninflammatory): Secondary | ICD-10-CM

## 2020-04-07 DIAGNOSIS — I110 Hypertensive heart disease with heart failure: Secondary | ICD-10-CM | POA: Diagnosis not present

## 2020-04-07 DIAGNOSIS — I7300 Raynaud's syndrome without gangrene: Secondary | ICD-10-CM | POA: Insufficient documentation

## 2020-04-07 DIAGNOSIS — I50810 Right heart failure, unspecified: Secondary | ICD-10-CM | POA: Diagnosis not present

## 2020-04-07 DIAGNOSIS — Z87891 Personal history of nicotine dependence: Secondary | ICD-10-CM | POA: Insufficient documentation

## 2020-04-07 HISTORY — DX: Heart failure, unspecified: I50.9

## 2020-04-07 LAB — CBC
HCT: 28.6 % — ABNORMAL LOW (ref 36.0–46.0)
Hemoglobin: 8.5 g/dL — ABNORMAL LOW (ref 12.0–15.0)
MCH: 22.6 pg — ABNORMAL LOW (ref 26.0–34.0)
MCHC: 29.7 g/dL — ABNORMAL LOW (ref 30.0–36.0)
MCV: 76.1 fL — ABNORMAL LOW (ref 80.0–100.0)
Platelets: 436 10*3/uL — ABNORMAL HIGH (ref 150–400)
RBC: 3.76 MIL/uL — ABNORMAL LOW (ref 3.87–5.11)
RDW: 16.5 % — ABNORMAL HIGH (ref 11.5–15.5)
WBC: 6 10*3/uL (ref 4.0–10.5)
nRBC: 0 % (ref 0.0–0.2)

## 2020-04-07 LAB — COMPREHENSIVE METABOLIC PANEL
ALT: 11 U/L (ref 0–44)
AST: 18 U/L (ref 15–41)
Albumin: 2.8 g/dL — ABNORMAL LOW (ref 3.5–5.0)
Alkaline Phosphatase: 61 U/L (ref 38–126)
Anion gap: 7 (ref 5–15)
BUN: 14 mg/dL (ref 8–23)
CO2: 24 mmol/L (ref 22–32)
Calcium: 8.8 mg/dL — ABNORMAL LOW (ref 8.9–10.3)
Chloride: 108 mmol/L (ref 98–111)
Creatinine, Ser: 0.78 mg/dL (ref 0.44–1.00)
GFR, Estimated: >60 mL/min (ref >60)
Glucose, Bld: 141 mg/dL — ABNORMAL HIGH (ref 70–99)
Potassium: 4.1 mmol/L (ref 3.5–5.1)
Sodium: 139 mmol/L (ref 135–145)
Total Bilirubin: 0.2 mg/dL — ABNORMAL LOW (ref 0.3–1.2)
Total Protein: 7.2 g/dL (ref 6.5–8.1)

## 2020-04-07 LAB — SEDIMENTATION RATE: Sed Rate: 38 mm/hr — ABNORMAL HIGH (ref 0–22)

## 2020-04-07 LAB — T4, FREE: Free T4: 0.98 ng/dL (ref 0.61–1.12)

## 2020-04-07 LAB — TSH: TSH: 3.753 u[IU]/mL (ref 0.350–4.500)

## 2020-04-07 NOTE — Patient Instructions (Signed)
Labs done today. We will contact you only if your labs are abnormal.  No medication changes were made. Please continue all current medications as prescribed.  Your physician recommends that you schedule a follow-up appointment in: 2-3 months  Your provider has requested that you have a VQ scan done. If abnormal, we will contact you regarding your results.   If you have any questions or concerns before your next appointment please send [place] a message through [place] or call our office at [phone].    TO LEAVE A MESSAGE FOR THE NURSE SELECT OPTION 2, PLEASE LEAVE A MESSAGE INCLUDING: . YOUR NAME . DATE OF BIRTH . CALL BACK NUMBER . REASON FOR CALL**this is important as we prioritize the call backs  YOU WILL RECEIVE A CALL BACK THE SAME DAY AS LONG AS YOU CALL BEFORE 4:00 PM   Do the following things EVERYDAY: 1) Weigh yourself in the morning before breakfast. Write it down and keep it in a log. 2) Take your medicines as prescribed 3) Eat low salt foods--Limit salt (sodium) to 2000 mg per day.  4) Stay as active as you can everyday 5) Limit all fluids for the day to less than 2 liters   At the [facility], you and your health needs are our priority. As part of our continuing mission to provide you with exceptional heart care, we have created designated Provider Care Teams. These Care Teams include your primary Cardiologist (physician) and Advanced Practice Providers (APPs- Physician Assistants and Nurse Practitioners) who all work together to provide you with the care you need, when you need it.   You may see any of the following providers on your designated Care Team at your next follow up: Marland Kitchen Dr Glori Bickers . Dr Loralie Champagne . Darrick Grinder, NP . Lyda Jester, PA . Audry Riles, PharmD   Please be sure to bring in all your medications bottles to every appointment.     You are scheduled for a Cardiac Catheterization on Tuesday, April 5 with Dr. Glori Bickers.  1. Please arrive at the [facility] (Main Entrance A) at [facility]: [address] [place], [place] [address] at 10:00 AM (This time is two hours before your procedure to ensure your preparation). Free valet parking service is available.   Special note: Every effort is made to have your procedure done on time. Please understand that emergencies sometimes delay scheduled procedures.  2. Diet: Do not eat solid foods after midnight.  The patient may have clear liquids until 5am upon the day of the procedure.  3.YOU MUST HAVE PRE PROCEDURE COVID TESTING DONE Friday April 1ST BETWEEN 8AM-2PM. THIS IS OUR DR[facility]-THRU TESTING CENTER. THE [address]ESS IS [place]. [place] [address]. YOU MUST SELF QUARANTINE UNTIL THE DAY OF YOUR PROCEDURE.  4. ON Monday April 4TH AND Tuesday April 5TH DO NOT TAKE [place]. THE MORNING OF YOUR PROCEDURE DO NOT TAKE YOUR LASIX.  On the morning of your procedure, take your Aspirin and any morning medicines NOT listed above.  You may use sips of water.  5. Plan for one night stay--bring personal belongings. 6. Bring a current list of your medications and current insurance cards. 7. You MUST have a responsible person to drive you home. 8. Someone MUST be with you the first 24 hours after you arrive home or your discharge will be delayed. 9. Please wear clothes that are easy to get on and off and wear slip-on shoes.  Thank you for allowing [place] to care for you!   -- [facility]

## 2020-04-08 ENCOUNTER — Other Ambulatory Visit (HOSPITAL_COMMUNITY): Payer: Self-pay | Admitting: *Deleted

## 2020-04-08 DIAGNOSIS — I50810 Right heart failure, unspecified: Secondary | ICD-10-CM

## 2020-04-08 DIAGNOSIS — I272 Pulmonary hypertension, unspecified: Secondary | ICD-10-CM

## 2020-04-08 LAB — RHEUMATOID FACTOR: Rheumatoid fact SerPl-aCnc: 11.7 IU/mL (ref <14.0)

## 2020-04-08 LAB — ANCA TITERS
Atypical P-ANCA titer: <1:20 {titer}
C-ANCA: <1:20 {titer}
P-ANCA: <1:20 {titer}

## 2020-04-08 LAB — CYCLIC CITRUL PEPTIDE ANTIBODY, IGG/IGA: CCP Antibodies IgG/IgA: 8 units (ref 0–19)

## 2020-04-08 LAB — T3, FREE: T3, Free: 2.5 pg/mL (ref 2.0–4.4)

## 2020-04-08 LAB — ANTI-SCLERODERMA ANTIBODY: Scleroderma (Scl-70) (ENA) Antibody, IgG: <0.2 AI (ref 0.0–0.9)

## 2020-04-08 LAB — ANA: Anti Nuclear Antibody (ANA): POSITIVE — AB

## 2020-04-12 ENCOUNTER — Ambulatory Visit (HOSPITAL_COMMUNITY)
Admission: RE | Admit: 2020-04-12 | Discharge: 2020-04-12 | Disposition: A | Discharge status: Home / Self Care | Payer: Medicare HMO | Source: Ambulatory Visit | Attending: Internal Medicine | Admitting: Internal Medicine

## 2020-04-12 ENCOUNTER — Other Ambulatory Visit: Payer: Self-pay

## 2020-04-12 DIAGNOSIS — I3139 Other pericardial effusion (noninflammatory): Secondary | ICD-10-CM

## 2020-04-12 DIAGNOSIS — I50810 Right heart failure, unspecified: Secondary | ICD-10-CM | POA: Diagnosis not present

## 2020-04-12 DIAGNOSIS — R079 Chest pain, unspecified: Secondary | ICD-10-CM | POA: Insufficient documentation

## 2020-04-12 DIAGNOSIS — I313 Pericardial effusion (noninflammatory): Secondary | ICD-10-CM | POA: Diagnosis not present

## 2020-04-12 DIAGNOSIS — R0602 Shortness of breath: Secondary | ICD-10-CM | POA: Diagnosis not present

## 2020-04-12 MED ORDER — TECHNETIUM TO 99M ALBUMIN AGGREGATED
4.2000 | Freq: Once | INTRAVENOUS | Status: AC | PRN
  Administered 2020-04-12: 4.2 via INTRAVENOUS

## 2020-04-13 LAB — FERRITIN: Ferritin: 11 ng/mL — ABNORMAL LOW (ref 15–150)

## 2020-04-13 LAB — SPECIMEN STATUS REPORT

## 2020-04-13 LAB — IRON AND TIBC
Iron Saturation: 4 % — CL (ref 15–55)
Iron: 12 ug/dL — ABNORMAL LOW (ref 27–139)
Total Iron Binding Capacity: 292 ug/dL (ref 250–450)
UIBC: 280 ug/dL (ref 118–369)

## 2020-04-16 ENCOUNTER — Other Ambulatory Visit (HOSPITAL_COMMUNITY)
Admission: RE | Admit: 2020-04-16 | Discharge: 2020-04-16 | Disposition: A | Discharge status: Home / Self Care | Payer: Medicare HMO | Source: Ambulatory Visit | Attending: Internal Medicine | Admitting: Internal Medicine

## 2020-04-16 DIAGNOSIS — Z20822 Contact with and (suspected) exposure to covid-19: Secondary | ICD-10-CM | POA: Diagnosis not present

## 2020-04-16 DIAGNOSIS — Z01812 Encounter for preprocedural laboratory examination: Secondary | ICD-10-CM | POA: Insufficient documentation

## 2020-04-17 LAB — SARS CORONAVIRUS 2 (TAT 6-24 HRS): SARS Coronavirus 2: NEGATIVE

## 2020-04-20 ENCOUNTER — Other Ambulatory Visit: Payer: Self-pay

## 2020-04-20 ENCOUNTER — Encounter (HOSPITAL_COMMUNITY)
Admission: RE | Disposition: A | Discharge status: Home / Self Care | Payer: Self-pay | Source: Ambulatory Visit | Attending: Internal Medicine

## 2020-04-20 ENCOUNTER — Ambulatory Visit (HOSPITAL_COMMUNITY)
Admission: RE | Admit: 2020-04-20 | Discharge: 2020-04-20 | Disposition: A | Discharge status: Home / Self Care | Payer: Medicare HMO | Source: Ambulatory Visit | Attending: Internal Medicine | Admitting: Internal Medicine

## 2020-04-20 DIAGNOSIS — I2721 Secondary pulmonary arterial hypertension: Secondary | ICD-10-CM

## 2020-04-20 DIAGNOSIS — Z79899 Other long term (current) drug therapy: Secondary | ICD-10-CM | POA: Insufficient documentation

## 2020-04-20 DIAGNOSIS — Z87891 Personal history of nicotine dependence: Secondary | ICD-10-CM | POA: Diagnosis not present

## 2020-04-20 DIAGNOSIS — Z28310 Unvaccinated for covid-19: Secondary | ICD-10-CM | POA: Insufficient documentation

## 2020-04-20 DIAGNOSIS — I313 Pericardial effusion (noninflammatory): Secondary | ICD-10-CM | POA: Insufficient documentation

## 2020-04-20 DIAGNOSIS — I50810 Right heart failure, unspecified: Secondary | ICD-10-CM

## 2020-04-20 DIAGNOSIS — Z8616 Personal history of COVID-19: Secondary | ICD-10-CM | POA: Insufficient documentation

## 2020-04-20 DIAGNOSIS — Z7901 Long term (current) use of anticoagulants: Secondary | ICD-10-CM | POA: Diagnosis not present

## 2020-04-20 DIAGNOSIS — I272 Pulmonary hypertension, unspecified: Secondary | ICD-10-CM

## 2020-04-20 HISTORY — PX: RIGHT HEART CATH: CATH118263

## 2020-04-20 LAB — POCT I-STAT EG7
Acid-Base Excess: 0 mmol/L (ref 0.0–2.0)
Acid-Base Excess: 1 mmol/L (ref 0.0–2.0)
Acid-base deficit: 1 mmol/L (ref 0.0–2.0)
Acid-base deficit: 1 mmol/L (ref 0.0–2.0)
Bicarbonate: 23.3 mmol/L (ref 20.0–28.0)
Bicarbonate: 23.7 mmol/L (ref 20.0–28.0)
Bicarbonate: 24.3 mmol/L (ref 20.0–28.0)
Bicarbonate: 25.3 mmol/L (ref 20.0–28.0)
Calcium, Ion: 1.02 mmol/L — ABNORMAL LOW (ref 1.15–1.40)
Calcium, Ion: 1.08 mmol/L — ABNORMAL LOW (ref 1.15–1.40)
Calcium, Ion: 1.09 mmol/L — ABNORMAL LOW (ref 1.15–1.40)
Calcium, Ion: 1.2 mmol/L (ref 1.15–1.40)
HCT: 22 % — ABNORMAL LOW (ref 36.0–46.0)
HCT: 24 % — ABNORMAL LOW (ref 36.0–46.0)
HCT: 24 % — ABNORMAL LOW (ref 36.0–46.0)
HCT: 25 % — ABNORMAL LOW (ref 36.0–46.0)
Hemoglobin: 7.5 g/dL — ABNORMAL LOW (ref 12.0–15.0)
Hemoglobin: 8.2 g/dL — ABNORMAL LOW (ref 12.0–15.0)
Hemoglobin: 8.2 g/dL — ABNORMAL LOW (ref 12.0–15.0)
Hemoglobin: 8.5 g/dL — ABNORMAL LOW (ref 12.0–15.0)
O2 Saturation: 71 %
O2 Saturation: 73 %
O2 Saturation: 74 %
O2 Saturation: 76 %
Potassium: 3.5 mmol/L (ref 3.5–5.1)
Potassium: 3.6 mmol/L (ref 3.5–5.1)
Potassium: 3.7 mmol/L (ref 3.5–5.1)
Potassium: 4 mmol/L (ref 3.5–5.1)
Sodium: 137 mmol/L (ref 135–145)
Sodium: 140 mmol/L (ref 135–145)
Sodium: 142 mmol/L (ref 135–145)
Sodium: 144 mmol/L (ref 135–145)
TCO2: 24 mmol/L (ref 22–32)
TCO2: 25 mmol/L (ref 22–32)
TCO2: 25 mmol/L (ref 22–32)
TCO2: 26 mmol/L (ref 22–32)
pCO2, Ven: 36.1 mmHg — ABNORMAL LOW (ref 44.0–60.0)
pCO2, Ven: 36.6 mmHg — ABNORMAL LOW (ref 44.0–60.0)
pCO2, Ven: 37.7 mmHg — ABNORMAL LOW (ref 44.0–60.0)
pCO2, Ven: 38 mmHg — ABNORMAL LOW (ref 44.0–60.0)
pH, Ven: 7.396 (ref 7.250–7.430)
pH, Ven: 7.418 (ref 7.250–7.430)
pH, Ven: 7.435 — ABNORMAL HIGH (ref 7.250–7.430)
pH, Ven: 7.436 — ABNORMAL HIGH (ref 7.250–7.430)
pO2, Ven: 36 mmHg (ref 32.0–45.0)
pO2, Ven: 38 mmHg (ref 32.0–45.0)
pO2, Ven: 38 mmHg (ref 32.0–45.0)
pO2, Ven: 40 mmHg (ref 32.0–45.0)

## 2020-04-20 SURGERY — RIGHT HEART CATH
Anesthesia: LOCAL

## 2020-04-20 MED ORDER — SODIUM CHLORIDE 0.9 % IV SOLN: INTRAVENOUS | Status: DC

## 2020-04-20 MED ORDER — SODIUM CHLORIDE 0.9% FLUSH: 3.0000 mL | Freq: Two times a day (BID) | INTRAVENOUS | Status: DC

## 2020-04-20 MED ORDER — SILDENAFIL CITRATE 20 MG PO TABS: 20.0000 mg | ORAL_TABLET | Freq: Three times a day (TID) | ORAL | 3 refills | Status: DC

## 2020-04-20 MED ORDER — SODIUM CHLORIDE 0.9% FLUSH: 3.0000 mL | INTRAVENOUS | Status: DC | PRN

## 2020-04-20 MED ORDER — ACETAMINOPHEN 325 MG PO TABS: 650.0000 mg | ORAL_TABLET | ORAL | Status: DC | PRN

## 2020-04-20 MED ORDER — LIDOCAINE HCL (PF) 1 % IJ SOLN
INTRAMUSCULAR | Status: DC | PRN
  Administered 2020-04-20: 2 mL

## 2020-04-20 MED ORDER — SODIUM CHLORIDE 0.9 % IV SOLN: INTRAVENOUS | Status: AC

## 2020-04-20 MED ORDER — HEPARIN (PORCINE) IN NACL 1000-0.9 UT/500ML-% IV SOLN
INTRAVENOUS | Status: AC
  Filled 2020-04-20: qty 500

## 2020-04-20 MED ORDER — IOHEXOL 350 MG/ML SOLN
INTRAVENOUS | Status: DC | PRN
Start: 2020-04-20 — End: 2020-04-20
  Administered 2020-04-20: 35 mL via INTRA_ARTERIAL
  Administered 2020-04-20: 15 mL via INTRAVENOUS

## 2020-04-20 MED ORDER — ASPIRIN 81 MG PO CHEW: 81.0000 mg | CHEWABLE_TABLET | ORAL | Status: DC

## 2020-04-20 MED ORDER — ONDANSETRON HCL 4 MG/2ML IJ SOLN: 4.0000 mg | Freq: Four times a day (QID) | INTRAMUSCULAR | Status: DC | PRN

## 2020-04-20 MED ORDER — SODIUM CHLORIDE 0.9 % IV SOLN: 250.0000 mL | INTRAVENOUS | Status: DC | PRN

## 2020-04-20 MED ORDER — HEPARIN (PORCINE) IN NACL 1000-0.9 UT/500ML-% IV SOLN
INTRAVENOUS | Status: DC | PRN
  Administered 2020-04-20: 500 mL

## 2020-04-20 MED ORDER — LIDOCAINE HCL (PF) 1 % IJ SOLN
INTRAMUSCULAR | Status: AC
  Filled 2020-04-20: qty 30

## 2020-04-20 MED ORDER — ASPIRIN 81 MG PO CHEW
81.0000 mg | CHEWABLE_TABLET | ORAL | Status: AC
  Administered 2020-04-20: 81 mg via ORAL
  Filled 2020-04-20: qty 1

## 2020-04-20 MED ORDER — LABETALOL HCL 5 MG/ML IV SOLN: 10.0000 mg | INTRAVENOUS | Status: DC | PRN

## 2020-04-20 MED ORDER — HYDRALAZINE HCL 20 MG/ML IJ SOLN: 10.0000 mg | INTRAMUSCULAR | Status: DC | PRN

## 2020-04-20 SURGICAL SUPPLY — 5 items
CATH SWAN GANZ 7F STRAIGHT (CATHETERS) ×1 IMPLANT
GLIDESHEATH SLENDER 7FR .021G (SHEATH) ×1 IMPLANT
GUIDEWIRE .025 260CM (WIRE) ×1 IMPLANT
PACK CARDIAC CATHETERIZATION (CUSTOM PROCEDURE TRAY) ×2 IMPLANT
TRANSDUCER W/STOPCOCK (MISCELLANEOUS) ×2 IMPLANT

## 2020-04-20 NOTE — Discharge Instructions (Signed)
Call Dr Bensimhon's office if any problems, questions, or concerns; call if any problem right arm site: redness, drainage, fever, pain, bleeding or swelling

## 2020-04-20 NOTE — Research (Signed)
[place] Informed Consent   Subject Name: Jacqueline Harris  Subject met inclusion and exclusion criteria.  The informed consent form, study requirements and expectations were reviewed with the subject and questions and concerns were addressed prior to the signing of the consent form.  The subject verbalized understanding of the trail requirements.  The subject agreed to participate in the [facility] trial and signed the informed consent.  The informed consent was obtained prior to performance of any protocol-specific procedures for the subject.  A copy of the signed informed consent was given to the subject and a copy was placed in the subject's medical record.  Mena Goes. 04/20/2020, 12:15 PM

## 2020-04-20 NOTE — Interval H&P Note (Signed)
History and Physical Interval Note:  04/20/2020 1:19 PM  Jacqueline Harris  has presented today for surgery, with the diagnosis of PAH The various methods of treatment have been discussed with the patient and family. After consideration of risks, benefits and other options for treatment, the patient has consented to  Procedure(s): RIGHT HEART CATH (N/A) as a surgical intervention.  The patient's history has been reviewed, patient examined, no change in status, stable for surgery.  I have reviewed the patient's chart and labs.  Questions were answered to the patient's satisfaction.     [name]

## 2020-04-21 ENCOUNTER — Encounter (HOSPITAL_COMMUNITY): Payer: Self-pay | Admitting: Internal Medicine

## 2020-04-21 ENCOUNTER — Other Ambulatory Visit (HOSPITAL_COMMUNITY): Payer: Self-pay

## 2020-04-21 ENCOUNTER — Telehealth (HOSPITAL_COMMUNITY): Payer: Self-pay | Admitting: Pharmacy Technician

## 2020-04-21 ENCOUNTER — Encounter (HOSPITAL_COMMUNITY): Payer: Self-pay

## 2020-04-21 NOTE — Telephone Encounter (Signed)
Patient Advocate Encounter   Received notification from [facility] that prior authorization for [employer] is required.   PA submitted on CoverMyMeds Key  [id number] Status is pending  Patient Advocate Encounter   Received notification from [facility] that prior authorization for Revatio is required.   PA submitted on CoverMyMeds Key [id number] Status is pending   Will continue to follow.

## 2020-04-21 NOTE — Telephone Encounter (Signed)
Advanced Heart Failure Patient Advocate Encounter  Prior Authorization for Opsumit has been approved.    PA# [id number] Effective dates: 04/21/20 through 01/15/21  Attached the PA approval to the patient's Opsumit referral. Called and left the patient a message regarding signing the [employer]um[employer] referral. Can send to [place] after the patient's signature is received.

## 2020-04-21 NOTE — Telephone Encounter (Signed)
Spoke with patient and emailed the referral for her to sign.  Will fax once signature is obtained.

## 2020-04-22 DIAGNOSIS — E1165 Type 2 diabetes mellitus with hyperglycemia: Secondary | ICD-10-CM | POA: Diagnosis not present

## 2020-04-22 DIAGNOSIS — D509 Iron deficiency anemia, unspecified: Secondary | ICD-10-CM | POA: Diagnosis not present

## 2020-04-22 DIAGNOSIS — Z299 Encounter for prophylactic measures, unspecified: Secondary | ICD-10-CM | POA: Diagnosis not present

## 2020-04-22 DIAGNOSIS — E7800 Pure hypercholesterolemia, unspecified: Secondary | ICD-10-CM | POA: Diagnosis not present

## 2020-04-22 DIAGNOSIS — I272 Pulmonary hypertension, unspecified: Secondary | ICD-10-CM | POA: Diagnosis not present

## 2020-04-23 ENCOUNTER — Telehealth (HOSPITAL_COMMUNITY): Payer: Self-pay | Admitting: Pharmacy Technician

## 2020-04-23 ENCOUNTER — Other Ambulatory Visit (HOSPITAL_COMMUNITY): Payer: Self-pay

## 2020-04-23 NOTE — Telephone Encounter (Signed)
Advanced Heart Failure Patient Advocate Encounter  Prior Authorization for Sildenafil has been approved.    PA#  [id number] Effective dates: 04/21/20 through 01/15/21  Patients co-pay is $47  Charlann Boxer, CPhT

## 2020-04-29 ENCOUNTER — Ambulatory Visit: Payer: Medicare HMO | Admitting: Pulmonary Disease

## 2020-04-29 ENCOUNTER — Encounter: Payer: Self-pay | Admitting: Pulmonary Disease

## 2020-04-29 ENCOUNTER — Other Ambulatory Visit: Payer: Self-pay

## 2020-04-29 VITALS — BP 142/62 | HR 99 | Temp 98.3°F | Ht 64.0 in | Wt 139.4 lb

## 2020-04-29 DIAGNOSIS — I50810 Right heart failure, unspecified: Secondary | ICD-10-CM

## 2020-04-29 DIAGNOSIS — I270 Primary pulmonary hypertension: Secondary | ICD-10-CM

## 2020-04-29 DIAGNOSIS — J9601 Acute respiratory failure with hypoxia: Secondary | ICD-10-CM | POA: Diagnosis not present

## 2020-04-29 NOTE — Assessment & Plan Note (Signed)
She has developed pedal edema and I have asked her to take Lasix daily for 5 days and then to every other day based on weight gain

## 2020-04-29 NOTE — Assessment & Plan Note (Signed)
Appears to be WHO group 1. Serology was negative, no stigmata of collagen vascular disease.  No evidence of CHF.  Left heart looks okay on echo. She has been started on sildenafil for about a week and seems to be tolerating this well.  Plan is to start her on macitentan. We will obtain PFTs to assess lung function but her history of smoking is minimal and remote and I doubt that she has significant lung disease.  No evidence of ILD or CHF on CT angiogram. 6-minute walk test will be obtained for baseline. She does not have any history suggestive of sleep disordered breathing, she has minimal desaturation on exertion at this time and does not want to start on oxygen. We will follow her along with CHF service is titrating pulmonary vasodilators

## 2020-04-29 NOTE — Progress Notes (Signed)
Subjective:    Patient ID: Jacqueline Harris, female    DOB: November 24, 1945, 75 y.o.   MRN: [id number]  HPI  75 year old remote smoker presents for evaluation of pulmonary hypertension She has been evaluated by heart failure service/Dr. Bensimhon. She was diagnosed with COVID infection 09/2019 her main presenting symptom was loss of taste and fatigue, she lost weight from 145 to 128 pounds.  She contacted PCP and got some treatment for this. She was hospitalized 11/2019 with fever and found to have pneumonia/sepsis Further work-up showed left lower extremity DVT, CT chest showed multifocal patchy airspace disease, she was also found to have a moderate pericardial effusion.  Echo showed severe pulmonary hypertension with D-shaped septum and RVSP of 93 Cardiology evaluation included negative serology except for weakly positive ANA and right heart cath confirmed severe pulmonary hypertension with PVR 5 Wood units VQ scan was performed which did not show any evidence of perfusion defects She is maintained on Eliquis. On further questioning she reports a trip to [place] 3 years ago where she can walk 5 miles per day.  She reports a trip to [place] 08/2019 where she had to rent a scooter because she could not walk much at all.  She has been started on sildenafil since 4/5 and she is tolerating this well, no dizziness.  She reports pedal edema which is developed over the past week.  She has been sitting with her sister who was on hospice care and attributes this .  She is prescribed Lasix but only takes this on an as-needed basis  She smoked half pack per day for about 13 years before she quit in 1985. She is retired and worked in a warehouse on the receiving side, some mostly computer work and denies any exposures   PMH -  09/2019 covid pneumonia  11/2019 LLE DVT  Significant tests/ events reviewed  03/2020 VQ neg 11/2019 CTA chest >> No central, segmental, or subsegmental pulmonary  embolism. Moderate pericardial effusion with mild cardiomegaly.  Multifocal patchy airspace opacities  Echo 03/2020 RVSP 93, D shaped septum ,mod pericardial efusion  Serology 03/2020 ANA +, CCP neg, ANCA, scl neg  RHC 04/20/20 >>  RA = 4 RV = 67/4 PA = 68/22 (41) PCW = 3 Fick cardiac output/index = 7.3/4.4 Thermo CO/CI = 5.9/3.5 PVR = 5.2 (Fick) 6.5 (Thermo) Ao sat = 99% PA sat = 71%, 74% High SVC sat = 73%   Assessment: 1. Moderate PAH with normal left-sided pressures 2. Selective lower lobe R & L pulmonary angios: Mild distal pruning no evidence of clot    Past Medical History:  Diagnosis Date  . Acute deep vein thrombosis (DVT) of left peroneal vein [facility])    November 2021  . CHF (congestive heart failure) ([place])   . Essential hypertension   . Pneumonia due to COVID-19 virus    November 2021  . Prediabetes     Past Surgical History:  Procedure Laterality Date  . CHOLECYSTECTOMY    . COLONOSCOPY N/A 12/03/2014   Procedure: COLONOSCOPY;  Surgeon: Rogene Houston, MD;  [facility];  Service: Endoscopy;  Laterality: N/A;  830  . COLONOSCOPY WITH PROPOFOL N/A 03/10/2020   Procedure: COLONOSCOPY WITH PROPOFOL;  Surgeon: Rogene Houston, MD;  Location: [facility];  Service: Endoscopy;  Laterality: N/A;  1:15  . POLYPECTOMY  03/10/2020   Procedure: POLYPECTOMY;  Surgeon: Rogene Houston, MD;  Location: [facility];  Service: Endoscopy;;  . RIGHT  HEART CATH N/A 04/20/2020   Procedure: RIGHT HEART CATH;  Surgeon: Jolaine Artist, MD;  Location: [facility];  Service: Cardiovascular;  Laterality: N/A;   No Known Allergies   Social History   Socioeconomic History  . Marital status: Divorced    Spouse name: Not on file  . Number of children: Not on file  . Years of education: Not on file  . Highest education level: Not on file  Occupational History  . Not on file  Tobacco Use  . Smoking status: Former Smoker    Packs/day: 0.50     Years: 13.00    Pack years: 6.50    Types: Cigarettes    Quit date: 1985    Years since quitting: 37.3  . Smokeless tobacco: Never Used  Vaping Use  . Vaping Use: Never used  Substance and Sexual Activity  . Alcohol use: No  . Drug use: No  . Sexual activity: Not on file  Other Topics Concern  . Not on file  Social History Narrative  . Not on file   Social Determinants of Health   Financial Resource Strain: Not on file  Food Insecurity: Not on file  Transportation Needs: Not on file  Physical Activity: Not on file  Stress: Not on file  Social Connections: Not on file  Intimate Partner Violence: Not on file    Family History  Problem Relation Age of Onset  . Cancer - Lung Mother   . Cancer Father   . Cancer - Colon Brother   . Diabetes Sister   . Hypertension Sister     Review of Systems Constitutional: negative for anorexia, fevers and sweats  Eyes: negative for irritation, redness and visual disturbance  Ears, nose, mouth, throat, and face: negative for earaches, epistaxis, nasal congestion and sore throat  Respiratory: negative for cough,  sputum and wheezing  Cardiovascular: negative for chest pain, orthopnea, palpitations and syncope  Gastrointestinal: negative for abdominal pain, constipation, diarrhea, melena, nausea and vomiting  Genitourinary:negative for dysuria, frequency and hematuria  Hematologic/lymphatic: negative for bleeding, easy bruising and lymphadenopathy  Musculoskeletal:negative for arthralgias, muscle weakness and stiff joints  Neurological: negative for coordination problems, gait problems, headaches and weakness  Endocrine: negative for diabetic symptoms including polydipsia, polyuria and weight loss     Objective:   Physical Exam  Gen. Pleasant,  in no distress, normal affect ENT - no pallor,icterus, no post nasal drip, class 2 airway Neck: No JVD, no thyromegaly, no carotid bruits Lungs: no use of accessory muscles, no dullness to  percussion, decreased without rales or rhonchi  Cardiovascular: Rhythm regular, normal S1, loud P2, no murmurs or gallops, 1+ peripheral edema Abdomen: soft and non-tender, no hepatosplenomegaly, BS normal. Musculoskeletal: No deformities, no cyanosis or clubbing Neuro:  alert, non focal, no tremors       Assessment & Plan:

## 2020-04-29 NOTE — Patient Instructions (Signed)
  Schedule PFTs Start lasix daily  X 5 days until weight drops by 3-5 lbs then every other day Pulmonary rehab in the future

## 2020-04-30 ENCOUNTER — Telehealth (HOSPITAL_COMMUNITY): Payer: Self-pay | Admitting: Pharmacy Technician

## 2020-04-30 NOTE — Telephone Encounter (Signed)
Patient responded that she would prefer the Opsumit referral to be sent to her via mail.  Will send out and follow up.

## 2020-05-12 ENCOUNTER — Ambulatory Visit: Payer: Medicare HMO | Admitting: Cardiology

## 2020-05-26 ENCOUNTER — Telehealth (HOSPITAL_COMMUNITY): Payer: Self-pay | Admitting: Pharmacy Technician

## 2020-05-26 DIAGNOSIS — R7309 Other abnormal glucose: Secondary | ICD-10-CM | POA: Diagnosis not present

## 2020-05-26 DIAGNOSIS — Z299 Encounter for prophylactic measures, unspecified: Secondary | ICD-10-CM | POA: Diagnosis not present

## 2020-05-26 DIAGNOSIS — I272 Pulmonary hypertension, unspecified: Secondary | ICD-10-CM | POA: Diagnosis not present

## 2020-05-26 DIAGNOSIS — M353 Polymyalgia rheumatica: Secondary | ICD-10-CM | POA: Diagnosis not present

## 2020-05-26 DIAGNOSIS — D509 Iron deficiency anemia, unspecified: Secondary | ICD-10-CM | POA: Diagnosis not present

## 2020-05-26 NOTE — Telephone Encounter (Signed)
Advanced Heart Failure Patient Advocate Encounter  Sent in Opsumit referral to [employer] via fax.  Will follow up.

## 2020-06-02 DIAGNOSIS — D649 Anemia, unspecified: Secondary | ICD-10-CM | POA: Diagnosis not present

## 2020-06-07 DIAGNOSIS — D509 Iron deficiency anemia, unspecified: Secondary | ICD-10-CM | POA: Diagnosis not present

## 2020-06-22 ENCOUNTER — Encounter (HOSPITAL_COMMUNITY): Payer: Self-pay

## 2020-06-28 ENCOUNTER — Telehealth (HOSPITAL_COMMUNITY): Payer: Self-pay | Admitting: Pharmacy Technician

## 2020-06-28 NOTE — Telephone Encounter (Signed)
Advanced Heart Failure Patient Advocate Encounter  I received an email from the patient that [facility] gave her instruction to apply for a grant through [employer] ([employer]). The patient thought the office would be speaking with them. I replied to her email advising her that [employer] likes to work with the patient's directly and that she would need to call to complete that process.   I have not heard back from the patient since that email, called and left her a message to check on the status of the assistance request.   Charlann Boxer, CPhT

## 2020-06-29 ENCOUNTER — Other Ambulatory Visit (HOSPITAL_COMMUNITY): Payer: Self-pay | Admitting: *Deleted

## 2020-06-29 ENCOUNTER — Telehealth (HOSPITAL_COMMUNITY): Payer: Self-pay | Admitting: Pharmacy Technician

## 2020-06-29 MED ORDER — SILDENAFIL CITRATE 20 MG PO TABS: 20.0000 mg | ORAL_TABLET | Freq: Three times a day (TID) | ORAL | 3 refills | Status: DC

## 2020-06-29 NOTE — Telephone Encounter (Signed)
Advanced Heart Failure Patient Advocate Encounter  The patient called in and confirmed that she was approved for the assistance fund grant. It will cover the cost of Opsumit through the remainder of 2022. The patient is currently taking Sildenafil, which is also covered by this grant.  Sent Jasmine, ([place]) a message to send a 90 day RX to [facility].   Charlann Boxer, CPhT

## 2020-07-08 ENCOUNTER — Ambulatory Visit (HOSPITAL_COMMUNITY)
Admission: RE | Admit: 2020-07-08 | Discharge: 2020-07-08 | Disposition: A | Discharge status: Home / Self Care | Payer: Medicare HMO | Source: Ambulatory Visit | Attending: Internal Medicine | Admitting: Internal Medicine

## 2020-07-08 ENCOUNTER — Encounter (INDEPENDENT_AMBULATORY_CARE_PROVIDER_SITE_OTHER): Payer: Self-pay

## 2020-07-08 ENCOUNTER — Encounter (HOSPITAL_COMMUNITY): Payer: Self-pay | Admitting: Internal Medicine

## 2020-07-08 ENCOUNTER — Encounter (HOSPITAL_COMMUNITY): Payer: Self-pay

## 2020-07-08 ENCOUNTER — Other Ambulatory Visit: Payer: Self-pay

## 2020-07-08 VITALS — BP 140/60 | HR 104 | Wt 136.6 lb

## 2020-07-08 DIAGNOSIS — D509 Iron deficiency anemia, unspecified: Secondary | ICD-10-CM | POA: Diagnosis not present

## 2020-07-08 DIAGNOSIS — D500 Iron deficiency anemia secondary to blood loss (chronic): Secondary | ICD-10-CM

## 2020-07-08 DIAGNOSIS — I7300 Raynaud's syndrome without gangrene: Secondary | ICD-10-CM | POA: Diagnosis not present

## 2020-07-08 DIAGNOSIS — Z8616 Personal history of COVID-19: Secondary | ICD-10-CM | POA: Diagnosis not present

## 2020-07-08 DIAGNOSIS — E440 Moderate protein-calorie malnutrition: Secondary | ICD-10-CM | POA: Insufficient documentation

## 2020-07-08 DIAGNOSIS — I110 Hypertensive heart disease with heart failure: Secondary | ICD-10-CM | POA: Insufficient documentation

## 2020-07-08 DIAGNOSIS — Z28310 Unvaccinated for covid-19: Secondary | ICD-10-CM | POA: Insufficient documentation

## 2020-07-08 DIAGNOSIS — I5031 Acute diastolic (congestive) heart failure: Secondary | ICD-10-CM | POA: Insufficient documentation

## 2020-07-08 DIAGNOSIS — Z8249 Family history of ischemic heart disease and other diseases of the circulatory system: Secondary | ICD-10-CM | POA: Insufficient documentation

## 2020-07-08 DIAGNOSIS — I270 Primary pulmonary hypertension: Secondary | ICD-10-CM

## 2020-07-08 DIAGNOSIS — Z87891 Personal history of nicotine dependence: Secondary | ICD-10-CM | POA: Diagnosis not present

## 2020-07-08 DIAGNOSIS — I272 Pulmonary hypertension, unspecified: Secondary | ICD-10-CM | POA: Diagnosis not present

## 2020-07-08 DIAGNOSIS — I313 Pericardial effusion (noninflammatory): Secondary | ICD-10-CM | POA: Insufficient documentation

## 2020-07-08 DIAGNOSIS — Z86718 Personal history of other venous thrombosis and embolism: Secondary | ICD-10-CM | POA: Insufficient documentation

## 2020-07-08 DIAGNOSIS — Z79899 Other long term (current) drug therapy: Secondary | ICD-10-CM | POA: Diagnosis not present

## 2020-07-08 LAB — RETICULOCYTES
Immature Retic Fract: 14.1 % (ref 2.3–15.9)
RBC.: 4.4 MIL/uL (ref 3.87–5.11)
Retic Count, Absolute: 23.3 10*3/uL (ref 19.0–186.0)
Retic Ct Pct: 0.5 % (ref 0.4–3.1)

## 2020-07-08 LAB — CBC
HCT: 34.9 % — ABNORMAL LOW (ref 36.0–46.0)
Hemoglobin: 10.3 g/dL — ABNORMAL LOW (ref 12.0–15.0)
MCH: 23.3 pg — ABNORMAL LOW (ref 26.0–34.0)
MCHC: 29.5 g/dL — ABNORMAL LOW (ref 30.0–36.0)
MCV: 79 fL — ABNORMAL LOW (ref 80.0–100.0)
Platelets: 303 10*3/uL (ref 150–400)
RBC: 4.42 MIL/uL (ref 3.87–5.11)
RDW: 18.7 % — ABNORMAL HIGH (ref 11.5–15.5)
WBC: 5.1 10*3/uL (ref 4.0–10.5)
nRBC: 0 % (ref 0.0–0.2)

## 2020-07-08 LAB — BASIC METABOLIC PANEL
Anion gap: 7 (ref 5–15)
BUN: 10 mg/dL (ref 8–23)
CO2: 25 mmol/L (ref 22–32)
Calcium: 8.6 mg/dL — ABNORMAL LOW (ref 8.9–10.3)
Chloride: 105 mmol/L (ref 98–111)
Creatinine, Ser: 0.81 mg/dL (ref 0.44–1.00)
GFR, Estimated: >60 mL/min (ref >60)
Glucose, Bld: 176 mg/dL — ABNORMAL HIGH (ref 70–99)
Potassium: 4 mmol/L (ref 3.5–5.1)
Sodium: 137 mmol/L (ref 135–145)

## 2020-07-08 LAB — IRON AND TIBC
Iron: 17 ug/dL — ABNORMAL LOW (ref 28–170)
Saturation Ratios: 5 % — ABNORMAL LOW (ref 10.4–31.8)
TIBC: 323 ug/dL (ref 250–450)
UIBC: 306 ug/dL

## 2020-07-08 LAB — BRAIN NATRIURETIC PEPTIDE: B Natriuretic Peptide: 105.5 pg/mL — ABNORMAL HIGH (ref 0.0–100.0)

## 2020-07-08 LAB — FERRITIN: Ferritin: 14 ng/mL (ref 11–307)

## 2020-07-08 NOTE — Progress Notes (Signed)
ReDS Vest / Clip - 07/08/20 1200       ReDS Vest / Clip   Station Marker A    Ruler Value 23    ReDS Value Range High volume overload    ReDS Actual Value 43

## 2020-07-08 NOTE — Patient Instructions (Addendum)
1.Stop Opsumit 2. Will draw labs and call you if any abnormalities. 3. Take 40 mg Lasix and 20 meq of potassium dose for 3 days then return to your normal dose.  3. Return to Heart Failure Clinic in 2 months.

## 2020-07-08 NOTE — Progress Notes (Signed)
ADVANCED HF CLINIC CONSULT NOTE  Referring Physician:Samuel Domenic Polite, MD Primary Care: Glenda Chroman, MD Primary Cardiologist: Rozann Lesches, MD   HPI:  Jacqueline Harris is a 75 y.o. female with h/o HTN, tobacco use  referred by Dr. Domenic Polite for further evaluation of pulmonary HTN.   History includes COVID-19 in 9/21 (unvaccinated) than admitted to [facility] in 11/21 with PNA. Cardiology was involved in her care at that time with finding of a small pericardial effusion, also moderate RV dysfunction with severely elevated estimated RVSP of 68 mmHg.LVEF was normal.  It was suspected that this was related to acute pulmonary process at that point.  She also had an acute left peroneal vein DVT, treated with Eliquis.  Chest CTA at that time showed no obvious pulmonary embolus.  She saw Estella Husk in f/u. Echo 03/31/20 EF 60-65% Mildly dilated RV with mild RV dysfunction. Moderate TR. RVSP 19mHG. Moderate pericardial effusion   Smoked for about 13 years < 1ppd in her 296s Worked at a[employer] No environmental exposures. No personal or family h/o CTD. Does have Raynaud's.   Here with her daughter. Previously very healthy with no medical problems. Can do ADLs but gets SOB easily. Mild edema RLE (where she had DVT). No dizziness.   Has developed some cough and dizziness with macitentan, feels like her breathing is also worse since starting it, decreased exercise capacity.  Has had persistent anemia since November.  Dark stool yesterday very dark black. Primary care doctor giving her periodic transfusions.  Last transfusion a few weeks ago received 2 units.    Past Medical History:  Diagnosis Date   Acute deep vein thrombosis (DVT) of left peroneal vein ([facility]    November 2021   CHF (congestive heart failure) ([facility])    Essential hypertension    Pneumonia due to COVID-19 virus    November 2021   Prediabetes     Current Outpatient Medications  Medication Sig Dispense Refill   acetaminophen  (TYLENOL) 650 MG CR tablet Take 650 mg by mouth every 8 (eight) hours as needed for pain.     Ferrous Sulfate (SLOW FE) 142 (45 Fe) MG TBCR Take 142 mg by mouth daily.     furosemide (LASIX) 20 MG tablet Take 20 mg by mouth daily as needed.     macitentan (OPSUMIT) 10 MG tablet Take 10 mg by mouth daily.     Multiple Vitamins-Minerals (MULTIVITAMIN WITH MINERALS) tablet Take 1 tablet by mouth daily.     potassium chloride (KLOR-CON) 10 MEQ tablet Take 10 mEq by mouth daily as needed.     sildenafil (REVATIO) 20 MG tablet Take 1 tablet (20 mg total) by mouth 3 (three) times daily. 270 tablet 3   No current facility-administered medications for this encounter.    No Known Allergies    Social History   Socioeconomic History   Marital status: Divorced    Spouse name: Not on file   Number of children: Not on file   Years of education: Not on file   Highest education level: Not on file  Occupational History   Not on file  Tobacco Use   Smoking status: Former    Packs/day: 0.50    Years: 13.00    Pack years: 6.50    Types: Cigarettes    Quit date: 130   Years since quitting: 37.4   Smokeless tobacco: Never  Vaping Use   Vaping Use: Never used  Substance and Sexual Activity  Alcohol use: No   Drug use: No   Sexual activity: Not on file  Other Topics Concern   Not on file  Social History Narrative   Not on file   Social Determinants of Health   Financial Resource Strain: Not on file  Food Insecurity: Not on file  Transportation Needs: Not on file  Physical Activity: Not on file  Stress: Not on file  Social Connections: Not on file  Intimate Partner Violence: Not on file      Family History  Problem Relation Age of Onset   Cancer - Lung Mother    Cancer Father    Cancer - Colon Brother    Diabetes Sister    Hypertension Sister     Vitals:   07/08/20 1052  BP: 140/60  Pulse: (!) 104  SpO2: 95%  Weight: 62 kg (136 lb 9.6 oz)    PHYSICAL EXAM: General:   Well appearing. No respiratory difficulty HEENT: normal Neck: supple. JVP 12, Carotids 2+ bilat; no bruits. No lymphadenopathy or thryomegaly appreciated. Cor: PMI nondisplaced. Regular rate & rhythm. No rubs, gallops or murmurs. Lungs: bibasilar rales, no accessory muscle use Abdomen: soft, nontender, nondistended. No hepatosplenomegaly. No bruits or masses. Good bowel sounds. Extremities: 1-2+ LE edema Neuro: alert & oriented x 3, cranial nerves grossly intact. moves all 4 extremities w/o difficulty. Affect pleasant.  ASSESSMENT & PLAN:  1. PAH, WHO group 1 - Echo 03/31/20 EF 60-65% Mildly dilated RV with mild RV dysfunction. Moderate TR. RVSP 22mHG. Moderate pericardial effusion  - h/o COVID 19 infection 11/21 - hx of acute left peroneal vein DVT although no pulmonary embolus by chest CTA in November 2021.  - VQ scan negative for CTEPH - CTD serology negative,  - RHC 04/20/20 PA 68/22 (41), moderate PAH w/normal wedge of 3.  Selective lower lobe R and L pulmonary angio mild distal pruning but no clot - WHO group 1 started sildenafil 20 TID, macitentan was added two weeks ago - Feeling much worse on macitentan could be contributing to LV dysfunction, also has the potential to worsen anemia, will stop this today - Has not had PFT's yet but they are ordered, she will call and schedule this  2. Pericardial effusion - moderate by echo 3/22 - likely due to elevated coronary sinus/RA pressures - should improve with treatment of PH. Continue lasix   3. Acute Diastolic CHF -possibly precipitated by macitentan -REDS 43%, overloaded on exam -Increase lasix to 421mx 3 days  4. Microcytic Anemia, Iron Deficiency Anemia - Last transfusion a few weeks ago received 2 units. - High suspicion for GI blood loss, she endorses dark stools (no longer on oral iron), hx of GAVE syndrome - 02/2020 colonoscopy benign with single polyp, no EGD since 2012 - Repeat cbc, iron studies, reticulocytes -  unlikely that macitentan contributing to her anemia but this has been discontinued as well   WiKatherine RoanMD  11:27 AM  Patient seen and examined with the above-signed Advanced Practice Provider and/or Housestaff. I personally reviewed laboratory data, imaging studies and relevant notes. I independently examined the patient and formulated the important aspects of the plan. I have edited the note to reflect any of my changes or salient points. I have personally discussed the plan with the patient and/or family.  Macitentan started at last visit. Feels much worse. More SOB. Bloated. + LE edema. Also struggling with dark stools and found to have Fe-deficient anemia requiring transfusion. Had colonoscopy in the  last few months with Dr. Laural Golden but no EGD. Denies ab pain or weight loss.  ReDS 43%  General:  Pale No resp difficulty HEENT: normal Neck: supple. JVP to jaw  Carotids 2+ bilat; no bruits. No lymphadenopathy or thryomegaly appreciated. Cor: PMI nondisplaced. Regular rate & rhythm. No rubs, gallops or murmurs. Lungs: clear Abdomen: soft, nontender, nondistended. No hepatosplenomegaly. No bruits or masses. Good bowel sounds. Extremities: no cyanosis, clubbing, rash, 2+ edema Neuro: alert & orientedx3, cranial nerves grossly intact. moves all 4 extremities w/o difficulty. Affect pleasant  She has not tolerated macitentan well. Will stop for now. Can retry down the road. Double lasix for 3 days. Have urged her to f/u with Dr. Laural Golden for EGD. Check labs today.   Once anemia straightened out can readdress treatment of PAH. Consider increasing sildenafil or rechallenge with ERA.  Glori Bickers, MD  1:32 PM

## 2020-07-22 ENCOUNTER — Telehealth (HOSPITAL_COMMUNITY): Payer: Self-pay | Admitting: Cardiology

## 2020-07-22 DIAGNOSIS — D509 Iron deficiency anemia, unspecified: Secondary | ICD-10-CM

## 2020-07-22 NOTE — Telephone Encounter (Signed)
Referral for GI placed per DR Bensimhon

## 2020-08-12 ENCOUNTER — Telehealth (HOSPITAL_COMMUNITY): Payer: Self-pay | Admitting: Pharmacy Technician

## 2020-08-12 NOTE — Telephone Encounter (Signed)
Advanced Heart Failure Patient Advocate Encounter  I received communication from [facility] wanted to confirm the discontinuation of Opsumit.   I called and explained the side effects the patient was experiencing while on Opsumit. Confirmed that the patient is no longer on the medication.  Charlann Boxer, CPhT

## 2020-09-27 ENCOUNTER — Other Ambulatory Visit: Payer: Self-pay

## 2020-09-27 ENCOUNTER — Ambulatory Visit (HOSPITAL_COMMUNITY)
Admission: RE | Admit: 2020-09-27 | Discharge: 2020-09-27 | Disposition: A | Discharge status: Home / Self Care | Payer: Medicare HMO | Source: Ambulatory Visit | Attending: Internal Medicine | Admitting: Internal Medicine

## 2020-09-27 VITALS — BP 140/68 | HR 88 | Wt 131.4 lb

## 2020-09-27 DIAGNOSIS — I110 Hypertensive heart disease with heart failure: Secondary | ICD-10-CM | POA: Insufficient documentation

## 2020-09-27 DIAGNOSIS — I3139 Other pericardial effusion (noninflammatory): Secondary | ICD-10-CM

## 2020-09-27 DIAGNOSIS — I313 Pericardial effusion (noninflammatory): Secondary | ICD-10-CM | POA: Diagnosis not present

## 2020-09-27 DIAGNOSIS — I272 Pulmonary hypertension, unspecified: Secondary | ICD-10-CM | POA: Diagnosis not present

## 2020-09-27 DIAGNOSIS — I5032 Chronic diastolic (congestive) heart failure: Secondary | ICD-10-CM | POA: Diagnosis not present

## 2020-09-27 DIAGNOSIS — Z79899 Other long term (current) drug therapy: Secondary | ICD-10-CM | POA: Diagnosis not present

## 2020-09-27 DIAGNOSIS — Z8249 Family history of ischemic heart disease and other diseases of the circulatory system: Secondary | ICD-10-CM | POA: Diagnosis not present

## 2020-09-27 DIAGNOSIS — D509 Iron deficiency anemia, unspecified: Secondary | ICD-10-CM | POA: Diagnosis not present

## 2020-09-27 DIAGNOSIS — Z87891 Personal history of nicotine dependence: Secondary | ICD-10-CM | POA: Insufficient documentation

## 2020-09-27 DIAGNOSIS — Z8616 Personal history of COVID-19: Secondary | ICD-10-CM | POA: Diagnosis not present

## 2020-09-27 DIAGNOSIS — R053 Chronic cough: Secondary | ICD-10-CM | POA: Insufficient documentation

## 2020-09-27 DIAGNOSIS — I270 Primary pulmonary hypertension: Secondary | ICD-10-CM | POA: Diagnosis not present

## 2020-09-27 DIAGNOSIS — Z86718 Personal history of other venous thrombosis and embolism: Secondary | ICD-10-CM | POA: Insufficient documentation

## 2020-09-27 LAB — IRON AND TIBC
Iron: 15 ug/dL — ABNORMAL LOW (ref 28–170)
Saturation Ratios: 4 % — ABNORMAL LOW (ref 10.4–31.8)
TIBC: 337 ug/dL (ref 250–450)
UIBC: 322 ug/dL

## 2020-09-27 LAB — BASIC METABOLIC PANEL
Anion gap: 13 (ref 5–15)
BUN: 9 mg/dL (ref 8–23)
CO2: 21 mmol/L — ABNORMAL LOW (ref 22–32)
Calcium: 8.6 mg/dL — ABNORMAL LOW (ref 8.9–10.3)
Chloride: 105 mmol/L (ref 98–111)
Creatinine, Ser: 0.85 mg/dL (ref 0.44–1.00)
GFR, Estimated: >60 mL/min (ref >60)
Glucose, Bld: 126 mg/dL — ABNORMAL HIGH (ref 70–99)
Potassium: 4.2 mmol/L (ref 3.5–5.1)
Sodium: 139 mmol/L (ref 135–145)

## 2020-09-27 LAB — FERRITIN: Ferritin: 14 ng/mL (ref 11–307)

## 2020-09-27 LAB — CBC
HCT: 34.8 % — ABNORMAL LOW (ref 36.0–46.0)
Hemoglobin: 10.6 g/dL — ABNORMAL LOW (ref 12.0–15.0)
MCH: 23.6 pg — ABNORMAL LOW (ref 26.0–34.0)
MCHC: 30.5 g/dL (ref 30.0–36.0)
MCV: 77.3 fL — ABNORMAL LOW (ref 80.0–100.0)
Platelets: 428 10*3/uL — ABNORMAL HIGH (ref 150–400)
RBC: 4.5 MIL/uL (ref 3.87–5.11)
RDW: 18.4 % — ABNORMAL HIGH (ref 11.5–15.5)
WBC: 6.5 10*3/uL (ref 4.0–10.5)
nRBC: 0 % (ref 0.0–0.2)

## 2020-09-27 MED ORDER — SILDENAFIL CITRATE 20 MG PO TABS
40.0000 mg | ORAL_TABLET | Freq: Three times a day (TID) | ORAL | 3 refills | Status: DC
Start: 2020-09-27 — End: 2020-12-27

## 2020-09-27 NOTE — Progress Notes (Signed)
ADVANCED HF CLINIC NOTE  Referring Physician:Samuel Domenic Polite, MD Primary Care: Glenda Chroman, MD Primary Cardiologist: Rozann Lesches, MD HF: Dr. Haroldine Laws  HPI:  Jacqueline Harris is a 75 y.o. female with h/o HTN, tobacco use, iron deficiency anemia referred by Dr. Domenic Polite for further evaluation of pulmonary HTN.   History includes COVID-19 in 9/21 (unvaccinated) than admitted to [facility] in 11/21 with PNA. Cardiology was involved in her care at that time with finding of a small pericardial effusion, also moderate RV dysfunction with severely elevated estimated RVSP of 68 mmHg. LVEF was normal.  It was suspected that this was related to acute pulmonary process at that point.  She also had an acute left peroneal vein DVT, treated with Eliquis.  Chest CTA at that time showed no obvious pulmonary embolus.  She saw Estella Husk in f/u. Echo 03/31/20 EF 60-65% Mildly dilated RV with mild RV dysfunction. Moderate TR. RVSP 17mHG. Moderate pericardial effusion.    She's had persistent iron deficiency anemia since November 2021. Had periodic transfusions, most recently May 2022. Had been referred to GI for consideration of EGD but had some trouble scheduling an appointment.  Last seen for follow-up in June. Macitentan stopped at last visit d/t cough, dizziness and dyspnea. She was volume overloaded (ReDS 43%) and lasix doubled for three days.   She is here today for 3 month follow-up. Feeling better but still has quite a bit of dyspnea with exertion. Must stop to rest after climbing a flight of stairs and/or unloading dishwasher. Some difficulty preparing meals d/t fatigue, may opt for convenient snacks. No orthopnea or PND. May notice some LE edema after sitting with legs down. Has not used furosemide in 3-4 weeks. Denies CP. No recent dark or tarry stools. Not taking oral iron.   Smoked for about 13 years < 1ppd in her 89s Worked at [employer] No environmental exposures. No personal or family h/o CTD.  Does have Raynaud's.    RVernon04/05/22 Findings:   RA = 4 RV = 67/4 PA = 68/22 (41) PCW = 3 Fick cardiac output/index = 7.3/4.4 Thermo CO/CI = 5.9/3.5 PVR = 5.2 (Fick) 6.5 (Thermo) Ao sat = 99% PA sat = 71%, 74% High SVC sat = 73%   Selective lower lobe R & L pulmonary angios: Mild distal pruning no evidence of clot   Assessment: 1. Moderate PAH with normal left-sided pressures 2. Selective lower lobe R & L pulmonary angios: Mild distal pruning no evidence of clot  Past Medical History:  Diagnosis Date   Acute deep vein thrombosis (DVT) of left peroneal vein ([facility]    November 2021   CHF (congestive heart failure) (HCC)    Essential hypertension    Pneumonia due to COVID-19 virus    November 2021   Prediabetes     Current Outpatient Medications  Medication Sig Dispense Refill   acetaminophen (TYLENOL) 650 MG CR tablet Take 650 mg by mouth every 8 (eight) hours as needed for pain.     furosemide (LASIX) 20 MG tablet Take 20 mg by mouth daily as needed.     Multiple Vitamins-Minerals (MULTIVITAMIN WITH MINERALS) tablet Take 1 tablet by mouth daily.     potassium chloride (KLOR-CON) 10 MEQ tablet Take 10 mEq by mouth daily as needed.     sildenafil (REVATIO) 20 MG tablet Take 1 tablet (20 mg total) by mouth 3 (three) times daily. 270 tablet 3   No current facility-administered medications for this encounter.  No Known Allergies    Social History   Socioeconomic History   Marital status: Divorced    Spouse name: Not on file   Number of children: Not on file   Years of education: Not on file   Highest education level: Not on file  Occupational History   Not on file  Tobacco Use   Smoking status: Former    Packs/day: 0.50    Years: 13.00    Pack years: 6.50    Types: Cigarettes    Quit date: 26    Years since quitting: 37.7   Smokeless tobacco: Never  Vaping Use   Vaping Use: Never used  Substance and Sexual Activity   Alcohol use: No   Drug use: No    Sexual activity: Not on file  Other Topics Concern   Not on file  Social History Narrative   Not on file   Social Determinants of Health   Financial Resource Strain: Not on file  Food Insecurity: Not on file  Transportation Needs: Not on file  Physical Activity: Not on file  Stress: Not on file  Social Connections: Not on file  Intimate Partner Violence: Not on file      Family History  Problem Relation Age of Onset   Cancer - Lung Mother    Cancer Father    Cancer - Colon Brother    Diabetes Sister    Hypertension Sister     Vitals:   09/27/20 1341  BP: 140/68  Pulse: 88  SpO2: 96%  Weight: 59.6 kg (131 lb 6.4 oz)     PHYSICAL EXAM: ReDS: 34% General:  Well appearing. No resp difficulty HEENT: normal, + angiectasias around eyes Neck: supple. JVD 10. Carotids 2+ bilat; no bruits. No lymphadenopathy or thryomegaly appreciated. Cor: PMI nondisplaced. Regular rate & rhythm. No rubs, gallops or murmurs. Lungs: clear Abdomen: soft, nontender, nondistended. No hepatosplenomegaly. No bruits or masses. Good bowel sounds. Extremities: no cyanosis, clubbing, rash, 1+ edema, + angiectasias on palms Neuro: alert & orientedx3, cranial nerves grossly intact. moves all 4 extremities w/o difficulty. Affect pleasant   ASSESSMENT & PLAN:  1. PAH, WHO group 1 - Echo 03/31/20 EF 60-65% Mildly dilated RV with mild RV dysfunction. Moderate TR. RVSP 28mHG. Moderate pericardial effusion  - h/o COVID 19 infection 11/21 - hx of acute left peroneal vein DVT although no pulmonary embolus by chest CTA in November 2021.  - VQ scan negative for CTEPH - CTD serology negative - RHC 04/20/20 PA 68/22 (41), moderate PAH w/normal wedge of 3.  Selective lower lobe R and L pulmonary angio mild distal pruning but no clot - PFTs have not been scheduled. Reordered today.  - 6 minute walk test  - Increase sildenafil to 40 mg TID.  - Macitentan stopped at last visit. Suspect shortness of breath and  cough at that time was d/t volume overload. May retrial in the future.  - F/u with Dr. AElsworth Sohoas planned in October.  2. Pericardial effusion - moderate by echo 3/22 - likely due to elevated coronary sinus/RA pressures - should improve with treatment of PH. Continue lasix prn  3. Chronic Diastolic CHF - ReDS 373%- Does not appear significantly overloaded - Has furosemide to take prn  4. Microcytic Anemia, Iron Deficiency Anemia - Hx transfusions, las received 2 units pRBCs in May 2022 - High suspicion for GI blood loss, referred to GI in June but had difficulty making an appointment.  hx of GAVE syndrome -  no recurrent dark stools recently. - 02/2020 colonoscopy benign with single polyp, no EGD since 2012 - Repeat cbc, ferritin and iron binding capacity today. Depending on results, may need referral back to GI.  F/u 3-4 months  FINCH, LINDSAY N, PA-C  2:10 PM  Patient seen and examined with the above-signed Advanced Practice Provider and/or Housestaff. I personally reviewed laboratory data, imaging studies and relevant notes. I independently examined the patient and formulated the important aspects of the plan. I have edited the note to reflect any of my changes or salient points. I have personally discussed the plan with the patient and/or family.  Continues with NYHA III symptoms. Recent RHC with moderate PAH and low PCWP (after diuresis). On sildenafil 20 tid. Macitentan stopped due to worsening cough and SOB in setting of elevated fluid level (ReDS 43%). PFTs not done yet.   ReDS today 34%  General:  Elderly thin No resp difficulty HEENT: normal Neck: supple. JVP 8-9 Carotids 2+ bilat; no bruits. No lymphadenopathy or thryomegaly appreciated. Cor: PMI nondisplaced. Regular rate & rhythm. No rubs, gallops or murmurs. Lungs: clear Abdomen: soft, nontender, nondistended. No hepatosplenomegaly. No bruits or masses. Good bowel sounds. Extremities: no cyanosis, clubbing, rash, edema +  telangectasias Neuro: alert & orientedx3, cranial nerves grossly intact. moves all 4 extremities w/o difficulty. Affect pleasant  Suspect combination WHO Group I and II PH. Will increase sildenafil to 40 tid and see how she tolerates. Re-order PFTs. Has f/u with Dr. Elsworth Soho. Can consider rechallenge with ERA eventually as fluid improved. Has telengectasias on exam but Rheum serologies have been negative. Repeat echo at next visit to reassess effusion.   Glori Bickers, MD  3:10 PM

## 2020-09-27 NOTE — Progress Notes (Signed)
ReDS Vest / Clip - 09/27/20 1400       ReDS Vest / Clip   Station Marker A    Ruler Value 25    ReDS Value Range Low volume    ReDS Actual Value 34

## 2020-09-27 NOTE — Patient Instructions (Addendum)
6 minute walk test done today.  Labs done today. We will contact you only if your labs are abnormal.  INCREASE Sildenafil to 57m (2 tablets) by mouth 3 times daily.   No other medication changes were made. Please continue all current medications as prescribed.  Your physician has recommended that you have a pulmonary function test. Pulmonary Function Tests are a group of tests that measure how well air moves in and out of your lungs. We will contact you at a later date to schedule this appointment.  Your physician recommends that you schedule a follow-up appointment in: 3-4 months with an echo prior to your exam. Please contact our office in December to schedule a January appointment.   If you have any questions or concerns before your next appointment please send [place] message through [place] call our office at [phone]    TO LEAVE A MESSAGE FOR THE NURSE SELECT OPTION 2, PLEASE LEAVE A MESSAGE INCLUDING: YOUR NAME DATE OF BIRTH CALL BACK NUMBER REASON FOR CALL**this is important as we prioritize the call backs  YOU WILL RECEIVE A CALL BACK THE SAME DAY AS LONG AS YOU CALL BEFORE 4:00 PM   Do the following things EVERYDAY: Weigh yourself in the morning before breakfast. Write it down and keep it in a log. Take your medicines as prescribed Eat low salt foods--Limit salt (sodium) to 2000 mg per day.  Stay as active as you can everyday Limit all fluids for the day to less than 2 liters   At [facility] you and your health needs are our priority. As part of our continuing mission to provide you with exceptional heart care, we have created designated Provider Care Teams. These Care Teams include your primary Cardiologist (physician) and Advanced Practice Providers (APPs- Physician Assistants and Nurse Practitioners) who all work together to provide you with the care you need, when you need it.   You may see any of the following providers on your designated  Care Team at your next follow up: Dr DGlori BickersDr DHaynes Kerns NP BLyda Jester PUtahLAudry Riles PharmD   Please be sure to bring in all your medications bottles to every appointment.

## 2020-09-27 NOTE — Progress Notes (Signed)
6 Min Walk Test Completed  Pt ambulated 304.8 meters O2 Sat ranged 89%-96% on room air HR ranged 88-130

## 2020-09-28 ENCOUNTER — Telehealth (HOSPITAL_COMMUNITY): Payer: Self-pay | Admitting: Pharmacy Technician

## 2020-09-28 ENCOUNTER — Other Ambulatory Visit (HOSPITAL_COMMUNITY): Payer: Self-pay

## 2020-09-28 NOTE — Telephone Encounter (Signed)
Advanced Heart Failure Patient Advocate Encounter  Sent additional information to [facility] via fax, [phone].   Reference # for additional information: [id number]  Will follow up.

## 2020-09-28 NOTE — Progress Notes (Signed)
Patient should receive 2 doses of Feraheme. Will work on scheduling.

## 2020-09-28 NOTE — Telephone Encounter (Signed)
Patient Advocate Encounter   Received notification from [place] that prior authorization for Sildenafil is required.   PA submitted on CoverMyMeds Key [id number] Status is pending   Will continue to follow.

## 2020-09-29 ENCOUNTER — Other Ambulatory Visit (HOSPITAL_COMMUNITY): Payer: Self-pay

## 2020-09-29 NOTE — Telephone Encounter (Signed)
Advanced Heart Failure Patient Advocate Encounter  Prior Authorization for Sildenafil has been approved.    PA# [id number] Effective dates: 09/28/20 through 01/15/21  Patients co-pay is $81.75  Called and left the patient a message about the increase in the co-pay amount.  Charlann Boxer, CPhT

## 2020-11-01 ENCOUNTER — Other Ambulatory Visit: Payer: Self-pay

## 2020-11-01 ENCOUNTER — Ambulatory Visit: Payer: Medicare HMO | Admitting: Pulmonary Disease

## 2020-11-01 ENCOUNTER — Encounter: Payer: Self-pay | Admitting: Pulmonary Disease

## 2020-11-01 VITALS — BP 122/58 | HR 101 | Temp 98.0°F | Ht 64.0 in | Wt 129.0 lb

## 2020-11-01 DIAGNOSIS — I50810 Right heart failure, unspecified: Secondary | ICD-10-CM | POA: Diagnosis not present

## 2020-11-01 DIAGNOSIS — I270 Primary pulmonary hypertension: Secondary | ICD-10-CM

## 2020-11-01 NOTE — Progress Notes (Signed)
Subjective:    Patient ID: Jacqueline Harris, female    DOB: 25-Jun-1945, 75 y.o.   MRN: [id number]  HPI  75 yo remote smoker for FU of WHO 1/2 pulmonary hypertension  PMH -  09/2019 covid pneumonia  11/2019 LLE DVT Fe def anemia >> fe infusions  Presented 03/2020 , had COVID infection 09/2019  She reports a trip to [place] 08/2019 where she had to rent a scooter because she could not walk much at all   Chief Complaint  Patient presents with   Follow-up    Pt has not gotten a call for PFTs. SOB with exertion. Coughing persistent and dry.    Her initial office visit was in 04/2020.  She was started on sildenafil titrated to 40 mg twice daily.  Unfortunately she did not tolerate macitentan I have reviewed CHF clinic notes from Dr. Haroldine Laws ,ReDS score 34% 6-minute walk test was 305 L, with oxygen saturation 89-96  We had planned on PFTs but unfortunately this was never scheduled She has a remote history of smoking, less than 15 pack years  Macitentan stopped  06/2020  d/t cough, dizziness and dyspnea.  She continues to have shortness of breath with appears improved to her initial presentation.  Her pedal edema is resolved   Significant tests/ events reviewed   03/2020 VQ neg 11/2019 CTA chest >> No pulmonary embolism.  Moderate pericardial effusion with mild cardiomegaly.   Multifocal patchy airspace opacities   Echo 03/2020 RVSP 93, D shaped septum ,mod pericardial efusion   Serology 03/2020 ANA +, CCP neg, ANCA, scl neg   RHC 04/20/20 >>   RA = 4 RV = 67/4 PA = 68/22 (41) PCW = 3 Fick cardiac output/index = 7.3/4.4 Thermo CO/CI = 5.9/3.5 PVR = 5.2 (Fick) 6.5 (Thermo) Ao sat = 99% PA sat = 71%, 74% High SVC sat = 73%     Assessment: 1. Moderate PAH with normal left-sided pressures 2. Selective lower lobe R & L pulmonary angios: Mild distal pruning no evidence of clot  Review of Systems neg for any significant sore throat, dysphagia, itching, sneezing, nasal  congestion or excess/ purulent secretions, fever, chills, sweats, unintended wt loss, pleuritic or exertional cp, hempoptysis, orthopnea pnd or change in chronic leg swelling. Also denies presyncope, palpitations, heartburn, abdominal pain, nausea, vomiting, diarrhea or change in bowel or urinary habits, dysuria,hematuria, rash, arthralgias, visual complaints, headache, numbness weakness or ataxia.     Objective:   Physical Exam  Gen. Pleasant, well-nourished, in no distress ENT - no thrush, no pallor/icterus,no post nasal drip Neck: No JVD, no thyromegaly, no carotid bruits Lungs: no use of accessory muscles, no dullness to percussion, clear without rales or rhonchi  Cardiovascular: Rhythm regular, heart sounds  normal, no murmurs or gallops, no peripheral edema Musculoskeletal: No deformities, no cyanosis or clubbing        Assessment & Plan:

## 2020-11-01 NOTE — Assessment & Plan Note (Signed)
She appears to be well diuresed

## 2020-11-01 NOTE — Assessment & Plan Note (Signed)
Plan is for repeat echo to reassess pulmonary pressures.  If remains high then we may have to add ERA to sildenafil and attempt another trial.  She did not tolerate macitentan in the past We will reschedule PFTs to assess for primary lung disease although I doubt it.  Her infiltrates related to COVID-pneumonia have resolved on follow-up chest x-ray 03/2020.  She is only smoked less than 15 pack years so doubt that she has significant airway disease. I offered her a formal pulmonary rehab program

## 2020-11-01 NOTE — Patient Instructions (Addendum)
  Schedule pFTs Home based pulmonary rehab program Flu shot

## 2020-11-05 DIAGNOSIS — J069 Acute upper respiratory infection, unspecified: Secondary | ICD-10-CM | POA: Diagnosis not present

## 2020-11-05 DIAGNOSIS — Z299 Encounter for prophylactic measures, unspecified: Secondary | ICD-10-CM | POA: Diagnosis not present

## 2020-11-05 DIAGNOSIS — Z6823 Body mass index (BMI) 23.0-23.9, adult: Secondary | ICD-10-CM | POA: Diagnosis not present

## 2020-11-05 NOTE — Telephone Encounter (Signed)
RA please advise. Thanks  Pt is scheduled for a PFT on Monday 10/24 and she is scheduled to get her covid test tomorrow.  She stated that she is coughing and running a fever.  Do we need to reschedule this PFT?  thanks

## 2020-11-16 DIAGNOSIS — I6522 Occlusion and stenosis of left carotid artery: Secondary | ICD-10-CM | POA: Diagnosis not present

## 2020-11-16 DIAGNOSIS — Z1331 Encounter for screening for depression: Secondary | ICD-10-CM | POA: Diagnosis not present

## 2020-11-16 DIAGNOSIS — R5383 Other fatigue: Secondary | ICD-10-CM | POA: Diagnosis not present

## 2020-11-16 DIAGNOSIS — E7800 Pure hypercholesterolemia, unspecified: Secondary | ICD-10-CM | POA: Diagnosis not present

## 2020-11-16 DIAGNOSIS — Z1339 Encounter for screening examination for other mental health and behavioral disorders: Secondary | ICD-10-CM | POA: Diagnosis not present

## 2020-11-16 DIAGNOSIS — Z0000 Encounter for general adult medical examination without abnormal findings: Secondary | ICD-10-CM | POA: Diagnosis not present

## 2020-11-16 DIAGNOSIS — Z6822 Body mass index (BMI) 22.0-22.9, adult: Secondary | ICD-10-CM | POA: Diagnosis not present

## 2020-11-16 DIAGNOSIS — Z79899 Other long term (current) drug therapy: Secondary | ICD-10-CM | POA: Diagnosis not present

## 2020-11-16 DIAGNOSIS — Z7189 Other specified counseling: Secondary | ICD-10-CM | POA: Diagnosis not present

## 2020-11-16 DIAGNOSIS — R64 Cachexia: Secondary | ICD-10-CM | POA: Diagnosis not present

## 2020-11-16 DIAGNOSIS — Z789 Other specified health status: Secondary | ICD-10-CM | POA: Diagnosis not present

## 2020-11-16 DIAGNOSIS — Z299 Encounter for prophylactic measures, unspecified: Secondary | ICD-10-CM | POA: Diagnosis not present

## 2020-11-22 DIAGNOSIS — I6523 Occlusion and stenosis of bilateral carotid arteries: Secondary | ICD-10-CM | POA: Diagnosis not present

## 2020-11-22 DIAGNOSIS — I63039 Cerebral infarction due to thrombosis of unspecified carotid artery: Secondary | ICD-10-CM | POA: Diagnosis not present

## 2020-12-01 DIAGNOSIS — H2513 Age-related nuclear cataract, bilateral: Secondary | ICD-10-CM | POA: Diagnosis not present

## 2020-12-01 DIAGNOSIS — R7309 Other abnormal glucose: Secondary | ICD-10-CM | POA: Diagnosis not present

## 2020-12-01 DIAGNOSIS — H35013 Changes in retinal vascular appearance, bilateral: Secondary | ICD-10-CM | POA: Diagnosis not present

## 2020-12-01 DIAGNOSIS — H25013 Cortical age-related cataract, bilateral: Secondary | ICD-10-CM | POA: Diagnosis not present

## 2020-12-01 DIAGNOSIS — H524 Presbyopia: Secondary | ICD-10-CM | POA: Diagnosis not present

## 2020-12-06 ENCOUNTER — Telehealth (HOSPITAL_COMMUNITY): Payer: Self-pay | Admitting: Cardiology

## 2020-12-06 NOTE — Telephone Encounter (Signed)
Abnormal labs received from PCP Labs drawn 11/28/20 Hg 9 Hct 30.7  Per Jessica Milford,Np Hgb down from previous. Please have her follow up with [facility] (she is established there) for anemia work up  [facility]

## 2020-12-08 ENCOUNTER — Encounter (HOSPITAL_COMMUNITY): Payer: Self-pay

## 2020-12-13 ENCOUNTER — Telehealth: Payer: Self-pay | Admitting: Gastroenterology

## 2020-12-13 ENCOUNTER — Encounter: Payer: Self-pay | Admitting: Internal Medicine

## 2020-12-13 NOTE — Telephone Encounter (Signed)
Her last GI practice note is from 2011, I do not see any more recent notes from that.  That being said if she wishes to establish as a new patient for GI care here that is fine, she can be scheduled with anyone.  Thanks

## 2020-12-13 NOTE — Telephone Encounter (Signed)
Good morning Dr. Havery Moros, (DOD 12/13/20 a.m.)  This patient is requesting a transfer of care to our practice due to her GI doc retiring.  Her records are in Epic for your review.  Please let me know if you approve the transfer.  Thank you.

## 2020-12-15 ENCOUNTER — Ambulatory Visit (HOSPITAL_COMMUNITY)
Admission: RE | Admit: 2020-12-15 | Discharge: 2020-12-15 | Disposition: A | Discharge status: Home / Self Care | Payer: Medicare HMO | Source: Ambulatory Visit | Attending: Internal Medicine | Admitting: Internal Medicine

## 2020-12-15 DIAGNOSIS — I3139 Other pericardial effusion (noninflammatory): Secondary | ICD-10-CM | POA: Diagnosis not present

## 2020-12-15 DIAGNOSIS — I272 Pulmonary hypertension, unspecified: Secondary | ICD-10-CM | POA: Diagnosis not present

## 2020-12-15 DIAGNOSIS — I071 Rheumatic tricuspid insufficiency: Secondary | ICD-10-CM | POA: Diagnosis not present

## 2020-12-15 LAB — ECHOCARDIOGRAM COMPLETE
AR max vel: 2.17 cm2
AV Peak grad: 10 mmHg
Ao pk vel: 1.58 m/s
Area-P 1/2: 4.6 cm2
Calc EF: 57.6 %
S' Lateral: 2.3 cm
Single Plane A2C EF: 59.3 %
Single Plane A4C EF: 57.8 %

## 2020-12-23 ENCOUNTER — Encounter: Payer: Self-pay | Admitting: Internal Medicine

## 2020-12-23 ENCOUNTER — Ambulatory Visit (INDEPENDENT_AMBULATORY_CARE_PROVIDER_SITE_OTHER): Payer: Medicare HMO | Admitting: Internal Medicine

## 2020-12-23 VITALS — BP 130/50 | HR 94 | Ht 64.0 in | Wt 127.2 lb

## 2020-12-23 DIAGNOSIS — D509 Iron deficiency anemia, unspecified: Secondary | ICD-10-CM | POA: Diagnosis not present

## 2020-12-23 DIAGNOSIS — K921 Melena: Secondary | ICD-10-CM

## 2020-12-23 DIAGNOSIS — R1011 Right upper quadrant pain: Secondary | ICD-10-CM

## 2020-12-23 DIAGNOSIS — R1310 Dysphagia, unspecified: Secondary | ICD-10-CM | POA: Diagnosis not present

## 2020-12-23 MED ORDER — FERROUS SULFATE 324 (65 FE) MG PO TBEC
1.0000 | DELAYED_RELEASE_TABLET | Freq: Every day | ORAL | Status: DC
Start: 2020-12-23 — End: 2021-02-24

## 2020-12-23 NOTE — Progress Notes (Signed)
Chief Complaint: IDA  HPI : 75 year old female with history of pulm HTN, HFpEF, DVT, IDA presents with IDA  She has been having issues with IDA and thus has been referred by her cardiologist for further evaluation. She had a recent worsening of her IDA in 05/2020 when she required transfusion with 2 U pRBCs. She endorses melena approximately twice a month. Denies hematochezia. She has had more trouble swallowing than she had previously. Dysphagia has been over the last few months. Endorses dysphagia only to solids, not to liquids. Denies odynophagia. She has lost about 20 lbs over the last year, which may have been due to poor appetite from [place] and a hospitalization for pneumonia. She feels like her appetite has picked since then these hospitalizations. The only time that she vomits is if she has nasal drainage and a severe coughing episodes. Once in a while she will have a pain in her RUQ. This pain can occur sporadically. Her sister had cirrhosis due to NASH and also had NASH. Brother had colon cancer in 62s. Denies NSAID use. Denies blood thinners. Her last EGD was in 2012 and her last colonoscopy was earlier this year.   Wt Readings from Last 3 Encounters:  12/23/20 127 lb 4 oz (57.7 kg)  11/01/20 129 lb 0.6 oz (58.5 kg)  09/27/20 131 lb 6.4 oz (59.6 kg)   Past Medical History:  Diagnosis Date   Acute deep vein thrombosis (DVT) of left peroneal vein [facility])    November 2021   Anemia    Arthritis    CHF (congestive heart failure) (HCC)    Colon polyps    Essential hypertension    GAVE (gastric antral vascular ectasia)    HLD (hyperlipidemia)    Pneumonia due to COVID-19 virus    November 2021   Prediabetes      Past Surgical History:  Procedure Laterality Date   CHOLECYSTECTOMY     COLONOSCOPY N/A 12/03/2014   Procedure: COLONOSCOPY;  Surgeon: Rogene Houston, MD;  [facility];  Service: Endoscopy;  Laterality: N/A;  830   COLONOSCOPY WITH PROPOFOL N/A 03/10/2020    Procedure: COLONOSCOPY WITH PROPOFOL;  Surgeon: Rogene Houston, MD;  Location: [facility];  Service: Endoscopy;  Laterality: N/A;  1:15   POLYPECTOMY  03/10/2020   Procedure: POLYPECTOMY;  Surgeon: Rogene Houston, MD;  Location: [facility];  Service: Endoscopy;;   RIGHT HEART CATH N/A 04/20/2020   Procedure: RIGHT HEART CATH;  Surgeon: Jolaine Artist, MD;  Location: [facility];  Service: Cardiovascular;  Laterality: N/A;   Family History  Problem Relation Age of Onset   Cancer - Lung Mother    Bladder Cancer Father    Diabetes Father    COPD Father    Hypertension Sister    Heart disease Sister    Other Sister        GAVE   Diabetes Sister    Cirrhosis Sister    Other Sister        GAVE   Cancer - Colon Brother    Diabetes Brother    Heart disease Brother    Thyroid disease Son    Depression Son    Anxiety disorder Son    Hyperlipidemia Son    Social History   Tobacco Use   Smoking status: Former    Packs/day: 0.50    Years: 13.00    Pack years: 6.50    Types: Cigarettes  Quit date: 36    Years since quitting: 37.9   Smokeless tobacco: Never  Vaping Use   Vaping Use: Never used  Substance Use Topics   Alcohol use: No   Drug use: No   Current Outpatient Medications  Medication Sig Dispense Refill   acetaminophen (TYLENOL) 650 MG CR tablet Take 650 mg by mouth every 8 (eight) hours as needed for pain.     furosemide (LASIX) 20 MG tablet Take 20 mg by mouth daily as needed.     Multiple Vitamins-Minerals (MULTIVITAMIN WITH MINERALS) tablet Take 1 tablet by mouth daily.     potassium chloride (KLOR-CON) 10 MEQ tablet Take 10 mEq by mouth daily as needed.     sildenafil (REVATIO) 20 MG tablet Take 2 tablets (40 mg total) by mouth 3 (three) times daily. 547 tablet 3   No current facility-administered medications for this visit.   No Known Allergies   Review of Systems: All systems reviewed and negative except where noted in HPI.    Physical Exam: BP (!) 130/50 (BP Location: Left Arm, Patient Position: Sitting, Cuff Size: Normal)   Pulse 94   Ht _0  (1.626 m)   Wt 127 lb 4 oz (57.7 kg)   BMI 21.84 kg/m  Constitutional: Pleasant,well-developed, female in no acute distress. HEENT: Normocephalic and atraumatic. Conjunctivae are normal. No scleral icterus. Cardiovascular: Normal rate, regular rhythm.  Pulmonary/chest: Effort normal and breath sounds normal. No wheezing, rales or rhonchi. Abdominal: Soft, nondistended, nontender. Bowel sounds active throughout. There are no masses palpable. No hepatomegaly. Extremities: No edema Neurological: Alert and oriented to person place and time. Skin: Skin is warm and dry. No rashes noted. Psychiatric: Normal mood and affect. Behavior is normal.  Labs 09/2020: Hb 10.6, improved from prior. Plts 428. BMP unremarkable.  Ab U/S 12/12/14: IMPRESSION: 1. Cholecystectomy.  No biliary distention. 2.  Spleen is enlarged at 16.7 cm.  EGD 05/04/04: Esophagus. Mucosa at the esophagus normal. Noncritical incomplete ring was  noted at GE junction which was at 37 cm and hiatus was 39. Proximal segment  of the esophagus was also examined and no web or stricture was noted.  Stomach. It was empty and distended very well insufflation. Folds of  proximal stomach were normal. Examination mucosa at body, antrum, pyloric  channel as well as angularis, fundus and cardia was normal.  Duodenum. Bulbar mucosa was normal. Scope was passed to the second part of  duodenum where mucosa and folds were normal. Endoscope was withdrawn.  Esophagus was dilated initially by passing 56-French [facility] dilator;  however, I was not able to pass this dilator completely because of  resistance. Dilator was withdrawn. Endoscope was passed again, and there was  two small linear tears at cervical esophagus. Endoscope was withdrawn, and  54-French [facility]oney dilator was passed through esophagus to full insertion.  As  the dilator was withdrawn, endoscope was passed again, and these linear  tears were noted to extended slightly. Pictures taken for the record.  Endoscope was withdrawn. The patient tolerated the procedure well.  FINAL DIAGNOSIS:  Incomplete noncritical Schatzki's ring and a small sliding  hiatal hernia. No obvious abnormality noted to cervical esophagus; however,  esophageal dilation with a 54- and 56-French dilator resulted in tear  possibly indicative of a web.  EGD 05/20/10: Esophagus:  Mucosa of the proximal and middle third was normal. Distally, there was a 4-mm flat polyp which was proximal to GE junction which was ablated via cold biopsy on the way  out.  There was focal edema and erythema to the GE junction, which was located at 34 cm.  No mass, ulcer, or other abnormalities were noted.  Hiatus was at 36.  Stomach:  It was empty and distended very well with insufflation.  Folds of proximal stomach were normal.  Examination of mucosa and body was normal.  In the antrum, she had 4 linear streaks of mucosa with telangiectasia.  These extended almost down to the pylorus.  None had stigmata of bleed and they were left alone.  No erosions or changes of portal gastropathy were noted.  Angularis, fundus, and cardia were unremarkable.  Duodenum:  Bulbar mucosa was normal.  Scope was passed into second part of the duodenum where mucosa and folds were normal.  On the way out, the small distal esophageal polyp was ablated via cold biopsy and endoscope was withdrawn.  The patient tolerated the procedure well.  FINAL DIAGNOSES: 1. No evidence of esophageal neoplasm. 2. A 4-mm polyp ablated via cold biopsy from distal esophagus,     possibly a squamous papilloma. 3. Mild changes of reflux esophagitis, limited to gastroesophageal     junction. 4. A 2-cm sliding hiatal hernia. 5. Gastral antral vascular ectasia (GAVE) without stigmata of     bleeding. Path: Esophagus, biopsy, polyp -  POLYPOID SQUAMOUS MUCOSA WITH DIFFUSE EPITHELIAL HYPERPLASIA, ABUNDANT INTRAEPITHELIAL LYMPHOCYTES, AND INCREASED INTRAEPITHELIAL EOSINOPHILS (UP TO 25/HPF) ASSOCIATED WITH EOSINOPHIL MICROABSCESS, SEE COMMENT. - NEGATIVE FOR DYSPLASIA OR MALIGNANCY. - PAS STAIN IS NEGATIVE FOR FUNGI (CANDIDA).  Colonoscopy 03/10/20: - One 4 mm polyp in the cecum, removed with a cold snare. Resected and retrieved. - External hemorrhoids. Path: A. COLON, CECAL, POLYPECTOMY:  - Tubular adenoma  - Negative for high-grade dysplasia or malignancy   ASSESSMENT AND PLAN:  IDA Dysphagia Melena RUQ ab pain Patient presents with IDA. She had a recent worsening of her IDA for unclear reasons, though her blood counts appears to have improved recently and have stabilized. She is not currently on any iron supplementation. Patient does describe occasional issues with melena, and has been noted to have GAVE on EGD in 2012. She also describes some worsening dysphagia and has been noted to have a Schatzki's ring in the past. After discussion with the patient about the risks (including increased risk of complications from sedation due to her pulm HTN) and benefits of the procedure, the patient would like to proceed with EGD for further evaluation - Start oral iron supplementation - Plan for EGD [facility] due to history of pulm HTN  Christia Reading, MD

## 2020-12-23 NOTE — Patient Instructions (Addendum)
If you are age 73 or older, your body mass index should be between 23-30. Your Body mass index is 21.84 kg/m. If this is out of the aforementioned range listed, please consider follow up with your Primary Care Provider. ________________________________________________________  The [facility] providers would like to encourage you to use [facility] to communicate with providers for non-urgent requests or questions.  Due to long hold times on the telephone, sending your provider a message by [facility] may be a faster and more efficient way to get a response.  Please allow 48 business hours for a response.  Please remember that this is for non-urgent requests.  _______________________________________________________  Dennis Bast have been scheduled for an endoscopy. Please follow written instructions given to you at your visit today. If you use inhalers (even only as needed), please bring them with you on the day of your procedure.  Due to recent changes in healthcare laws, you may see the results of your imaging and laboratory studies on MyChart before your provider has had a chance to review them.  We understand that in some cases there may be results that are confusing or concerning to you. Not all laboratory results come back in the same time frame and the provider may be waiting for multiple results in order to interpret others.  Please give [place] 48 hours in order for your provider to thoroughly review all the results before contacting the office for clarification of your results.   Please purchase the following medications over the counter and take as directed:  START: ferrous sulfate 374m one tablet daily.  Thank you for entrusting me with your care and choosing [facility]  Dr JArdis Hughs

## 2020-12-26 NOTE — H&P (View-Only) (Signed)
 [facility] CONSULT NOTE  Referring Physician:Samuel McDowell, MD Primary Care: Vyas, Dhruv B, MD Primary Cardiologist: Samuel McDowell, MD   HPI:  Jacqueline Harris is a 75 y.o. female with h/o HTN, tobacco use  referred by Dr. McDowell for further evaluation of pulmonary HTN.   History includes COVID-19 in 9/21 (unvaccinated) than admitted to [facility] in 11/21 with PNA. Cardiology was involved in her care at that time with finding of a small pericardial effusion, also moderate RV dysfunction with severely elevated estimated RVSP of 68 mmHg.LVEF was normal.  It was suspected that this was related to acute pulmonary process at that point.  She also had an acute left peroneal vein DVT, treated with Eliquis.  Chest CTA at that time showed no pulmonary embolus.  She saw Michelle Lenze in f/u. Echo 03/31/20 EF 60-65% Mildly dilated RV with mild RV dysfunction. Moderate TR. RVSP 94mmHG. Moderate pericardial effusion   Had carotid u/s with Dr. Vyas bilateral 40-46% stenosis   Echo 12/15/20 EF 60-65% Mild LVH G1DD. D-shaped septum RV dilated. Mild to moderately HK. RVSP 78mmHG. Large pericardial effusion (no tamponade)  Smoked for about 13 years < 1ppd in her 20s. Worked at a desk. No environmental exposures. No personal or family h/o CTD. Does have Raynaud's.   RHC in 4/22 RA = 4 RV = 67/4 PA = 68/22 (41) PCW = 3 Fick cardiac output/index = 7.3/4.4 Thermo CO/CI = 5.9/3.5 PVR = 5.2 (Fick) 6.5 (Thermo) Ao sat = 99% PA sat = 71%, 74% High SVC sat = 73%   Selective lower lobe R & L pulmonary angios: Mild distal pruning no evidence of clot  Has been on sildenafil 20 tid. In 9/22 started on macitentan but stopped after two weeks due to bloating and worsening SOB. Also being treated for acute Fe-def anemia at the time. Has been following with GI (Dr. Dorsey). Plan for EGD on 01/03/21  Here for f/u with her daughter. Restarted lasix last week and now feeling much better. Can get around the  house and do ADLs without too much trouble. Can go to the store and walk the aisles without too much problem. + ankle edema. No syncope/presyncope.     Past Medical History:  Diagnosis Date   Acute deep vein thrombosis (DVT) of left peroneal vein (HCC)    November 2021   Anemia    Arthritis    CHF (congestive heart failure) (HCC)    Colon polyps    Essential hypertension    GAVE (gastric antral vascular ectasia)    HLD (hyperlipidemia)    Pneumonia due to COVID-19 virus    November 2021   Prediabetes     Current Outpatient Medications  Medication Sig Dispense Refill   acetaminophen (TYLENOL) 650 MG CR tablet Take 650 mg by mouth every 8 (eight) hours as needed for pain.     ferrous sulfate 324 (65 Fe) MG TBEC Take 1 tablet (325 mg total) by mouth daily. 30 tablet    furosemide (LASIX) 20 MG tablet Take 20 mg by mouth daily as needed.     Multiple Vitamins-Minerals (MULTIVITAMIN WITH MINERALS) tablet Take 1 tablet by mouth daily.     potassium chloride (KLOR-CON) 10 MEQ tablet Take 10 mEq by mouth daily as needed.     sildenafil (REVATIO) 20 MG tablet Take 2 tablets (40 mg total) by mouth 3 (three) times daily. 547 tablet 3   No current facility-administered medications for this encounter.    No   Known Allergies    Social History   Socioeconomic History   Marital status: Divorced    Spouse name: Not on file   Number of children: 2   Years of education: Not on file   Highest education level: Not on file  Occupational History   Occupation: retired  Tobacco Use   Smoking status: Former    Packs/day: 0.50    Years: 13.00    Pack years: 6.50    Types: Cigarettes    Quit date: 1985    Years since quitting: 37.9   Smokeless tobacco: Never  Vaping Use   Vaping Use: Never used  Substance and Sexual Activity   Alcohol use: No   Drug use: No   Sexual activity: Not on file  Other Topics Concern   Not on file  Social History Narrative   Not on file   Social  Determinants of Health   Financial Resource Strain: Not on file  Food Insecurity: Not on file  Transportation Needs: Not on file  Physical Activity: Not on file  Stress: Not on file  Social Connections: Not on file  Intimate Partner Violence: Not on file      Family History  Problem Relation Age of Onset   Cancer - Lung Mother    Bladder Cancer Father    Diabetes Father    COPD Father    Hypertension Sister    Heart disease Sister    Other Sister        GAVE   Diabetes Sister    Cirrhosis Sister    Other Sister        GAVE   Cancer - Colon Brother    Diabetes Brother    Heart disease Brother    Thyroid disease Son    Depression Son    Anxiety disorder Son    Hyperlipidemia Son     Vitals:   12/27/20 1444  BP: (!) 150/60  Pulse: 92  SpO2: 98%  Weight: 58.2 kg (128 lb 3.2 oz)     PHYSICAL EXAM: General:  Sitting on exam table No resp difficulty HEENT: normal Neck: supple. JVP 10. Carotids 2+ bilat; no bruits. No lymphadenopathy or thryomegaly appreciated. Cor: PMI nondisplaced. Regular rate & rhythm. No rubs, gallops or murmurs. Lungs: clear Abdomen: soft, nontender, nondistended. No hepatosplenomegaly. No bruits or masses. Good bowel sounds. Extremities: no cyanosis, clubbing, rash, 2+ edema. Telengectacias on her hands Neuro: alert & orientedx3, cranial nerves grossly intact. moves all 4 extremities w/o difficulty. Affect pleasant   ASSESSMENT & PLAN:  1. PAH, WHO group 1 - Echo 03/31/20 EF 60-65% Mildly dilated RV with mild RV dysfunction. Moderate TR. RVSP 94mmHG. Moderate pericardial effusion  - h/o COVID 19 infection 11/21 - hx of acute left peroneal vein DVT although no pulmonary embolus by chest CTA in November 2021.  - VQ scan negative for CTEPH - CTD serology negative. She has telegectasias on her hands. Will repeat CTD labs - RHC 04/20/20 PA 68/22 (41), moderate PAH w/normal wedge of 3.  Selective lower lobe R and L pulmonary angio mild distal  pruning but no clot - Echo 12/15/20 EF 60-65% Mild LVH G1DD. D-shaped septum RV dilated. Mild to moderately HK. RVSP 78mmHG. Large pericardial effusion (no tamponade) - WHO group 1 started sildenafil 20 TID, macitentan was added in 9/21 but stopped after 2 weeks due to volume overload and Fe-deficient anemia  - Has not had PFT's yet - scheduled for later this week.  - I   suspect she has idiopathic PAH. She is fairly advanced with NYHA III symptoms, volume overload and RV strain on echo + pericardial effusion. We discussed seriousness of PAH and need for combination therapy but she has had trouble tolerating meds and is reluctant to rechallenge. Will increase sildenafil to 60 tid. Encouraged her to take lasix daily and will add spiro. Repeat RHC this week. Hopefully can rechallenge with ERA.   2. Pericardial effusion - moderate by echo 3/22 - large by echo 11/22. No tamponade. - likely due to elevated coronary sinus/RA pressures in setting of PAH - plan as above  3. Chronic Diastolic CHF - volume status elevated. - Encouraged her to take lasix daily and will add spiro.  4. Microcytic Anemia, Iron Deficiency Anemia - 02/2020 colonoscopy benign with single polyp, no EGD since 2012 - unlikely that macitentan contributing to her anemia but this has been discontinued as well - has scheduled EGD on 01/03/21 with Dr. Dorsey will perform RHC prior to help furtter risk stratify - check iron stores  5. Carotid stenosis - Had carotid u/s with Dr. Vyas bilateral 40-60% stenosis  - doesn't tolerate statins - Dr. Vyas following   Total time spent 45 minutes. Over half that time spent discussing above.   Julya Alioto, MD  3:00 PM    

## 2020-12-26 NOTE — Progress Notes (Signed)
ADVANCED HF CLINIC CONSULT NOTE  Referring Physician:Samuel Domenic Polite, MD Primary Care: Glenda Chroman, MD Primary Cardiologist: Rozann Lesches, MD   HPI:  Jacqueline Harris is a 75 y.o. female with h/o HTN, tobacco use  referred by Dr. Domenic Polite for further evaluation of pulmonary HTN.   History includes COVID-19 in 9/21 (unvaccinated) than admitted to [facility] in 11/21 with PNA. Cardiology was involved in her care at that time with finding of a small pericardial effusion, also moderate RV dysfunction with severely elevated estimated RVSP of 68 mmHg.LVEF was normal.  It was suspected that this was related to acute pulmonary process at that point.  She also had an acute left peroneal vein DVT, treated with Eliquis.  Chest CTA at that time showed no pulmonary embolus.  She saw Estella Husk in f/u. Echo 03/31/20 EF 60-65% Mildly dilated RV with mild RV dysfunction. Moderate TR. RVSP 20mHG. Moderate pericardial effusion   Had carotid u/s with Dr. VWoody Sellerbilateral 40-46% stenosis   Echo 12/15/20 EF 60-65% Mild LVH G1DD. D-shaped septum RV dilated. Mild to moderately HK. RVSP 761mG. 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 No personal or family h/o CTD. Does have Raynaud's.   RHC in 4/22 RA = 4 RV = 67/4 PA = 68/22 (41) PCW = 3 Fick cardiac output/index = 7.3/4.4 Thermo CO/CI = 5.9/3.5 PVR = 5.2 (Fick) 6.5 (Thermo) Ao sat = 99% PA sat = 71%, 74% High SVC sat = 73%   Selective lower lobe R & L pulmonary angios: Mild distal pruning no evidence of clot  Has been on sildenafil 20 tid. In 9/22 started on macitentan but stopped after two weeks due to bloating and worsening SOB. Also being treated for acute Fe-def anemia at the time. Has been following with GI (Dr. DoLorenso Courier Plan for EGD on 01/03/21  Here for f/u with her daughter. Restarted lasix last week and now feeling much better. Can get around the  house and do ADLs without too much trouble. Can go to the store and walk the aisles without too much problem. + ankle edema. No syncope/presyncope.     Past Medical History:  Diagnosis Date   [facility]   November 2021   Anemia    Arthritis    CHF (congestive heart failure) (HCC)    Colon polyps    Essential hypertension    GAVE (gastric antral vascular ectasia)    HLD (hyperlipidemia)    Pneumonia due to COVID-19 virus    November 2021   Prediabetes     Current Outpatient Medications  Medication Sig Dispense Refill   acetaminophen (TYLENOL) 650 MG CR tablet Take 650 mg by mouth every 8 (eight) hours as needed for pain.     ferrous sulfate 324 (65 Fe) MG TBEC Take 1 tablet (325 mg total) by mouth daily. 30 tablet    furosemide (LASIX) 20 MG tablet Take 20 mg by mouth daily as needed.     Multiple Vitamins-Minerals (MULTIVITAMIN WITH MINERALS) tablet Take 1 tablet by mouth daily.     potassium chloride (KLOR-CON) 10 MEQ tablet Take 10 mEq by mouth daily as needed.     sildenafil (REVATIO) 20 MG tablet Take 2 tablets (40 mg total) by mouth 3 (three) times daily. 547 tablet 3   No current facility-administered medications for this encounter.    No  Known Allergies    Social History   Socioeconomic History   Marital status: Divorced    Spouse name: Not on file   Number of children: 2   Years of education: Not on file   Highest education level: Not on file  Occupational History   Occupation: retired  Tobacco Use   Smoking status: Former    Packs/day: 0.50    Years: 13.00    Pack years: 6.50    Types: Cigarettes    Quit date: 1985    Years since quitting: 37.9   Smokeless tobacco: Never  Vaping Use   Vaping Use: Never used  Substance and Sexual Activity   Alcohol use: No   Drug use: No   Sexual activity: Not on file  Other Topics Concern   Not on file  Social History Narrative   Not on file   Social  Determinants of Health   Financial Resource Strain: Not on file  Food Insecurity: Not on file  Transportation Needs: Not on file  Physical Activity: Not on file  Stress: Not on file  Social Connections: Not on file  Intimate Partner Violence: Not on file      Family History  Problem Relation Age of Onset   Cancer - Lung Mother    Bladder Cancer Father    Diabetes Father    COPD Father    Hypertension Sister    Heart disease Sister    Other Sister        GAVE   Diabetes Sister    Cirrhosis Sister    Other Sister        GAVE   Cancer - Colon Brother    Diabetes Brother    Heart disease Brother    Thyroid disease Son    Depression Son    Anxiety disorder Son    Hyperlipidemia Son     Vitals:   12/27/20 1444  BP: (!) 150/60  Pulse: 92  SpO2: 98%  Weight: 58.2 kg (128 lb 3.2 oz)     PHYSICAL EXAM: General:  Sitting on exam table No resp difficulty HEENT: normal Neck: supple. JVP 10. Carotids 2+ bilat; no bruits. No lymphadenopathy or thryomegaly appreciated. Cor: PMI nondisplaced. Regular rate & rhythm. No rubs, gallops or murmurs. Lungs: clear Abdomen: soft, nontender, nondistended. No hepatosplenomegaly. No bruits or masses. Good bowel sounds. Extremities: no cyanosis, clubbing, rash, 2+ edema. Telengectacias on her hands Neuro: alert & orientedx3, cranial nerves grossly intact. moves all 4 extremities w/o difficulty. Affect pleasant   ASSESSMENT & PLAN:  1. PAH, WHO group 1 - Echo 03/31/20 EF 60-65% Mildly dilated RV with mild RV dysfunction. Moderate TR. RVSP 35mHG. Moderate pericardial effusion  - h/o COVID 19 infection 11/21 - hx of acute left peroneal vein DVT although no pulmonary embolus by chest CTA in November 2021.  - VQ scan negative for CTEPH - CTD serology negative. She has telegectasias on her hands. Will repeat CTD labs - RHC 04/20/20 PA 68/22 (41), moderate PAH w/normal wedge of 3.  Selective lower lobe R and L pulmonary angio mild distal  pruning but no clot - Echo 12/15/20 EF 60-65% Mild LVH G1DD. D-shaped septum RV dilated. Mild to moderately HK. RVSP 769mG. Large pericardial effusion (no tamponade) - WHO group 1 started sildenafil 20 TID, macitentan was added in 9/21 but stopped after 2 weeks due to volume overload and Fe-deficient anemia  - Has not had PFT's yet - scheduled for later this week.  - I  suspect she has idiopathic PAH. She is fairly advanced with NYHA III symptoms, volume overload and RV strain on echo + pericardial effusion. We discussed seriousness of PAH and need for combination therapy but she has had trouble tolerating meds and is reluctant to rechallenge. Will increase sildenafil to 60 tid. Encouraged her to take lasix daily and will add spiro. Repeat RHC this week. Hopefully can rechallenge with ERA.   2. Pericardial effusion - moderate by echo 3/22 - large by echo 11/22. No tamponade. - likely due to elevated coronary sinus/RA pressures in setting of PAH - plan as above  3. Chronic Diastolic CHF - volume status elevated. - Encouraged her to take lasix daily and will add spiro.  4. Microcytic Anemia, Iron Deficiency Anemia - 02/2020 colonoscopy benign with single polyp, no EGD since 2012 - unlikely that macitentan contributing to her anemia but this has been discontinued as well - has scheduled EGD on 01/03/21 with Dr. Lorenso Courier will perform RHC prior to help furtter risk stratify - check iron stores  5. Carotid stenosis - Had carotid u/s with Dr. Woody Seller bilateral 40-60% stenosis  - doesn't tolerate statins - Dr. Woody Seller following   Total time spent 45 minutes. Over half that time spent discussing above.   Glori Bickers, MD  3:00 PM

## 2020-12-27 ENCOUNTER — Other Ambulatory Visit (HOSPITAL_COMMUNITY): Payer: Self-pay

## 2020-12-27 ENCOUNTER — Ambulatory Visit (HOSPITAL_COMMUNITY)
Admission: RE | Admit: 2020-12-27 | Discharge: 2020-12-27 | Disposition: A | Discharge status: Home / Self Care | Payer: Medicare HMO | Source: Ambulatory Visit | Attending: Internal Medicine | Admitting: Internal Medicine

## 2020-12-27 ENCOUNTER — Encounter (HOSPITAL_COMMUNITY): Payer: Self-pay | Admitting: Internal Medicine

## 2020-12-27 ENCOUNTER — Other Ambulatory Visit: Payer: Self-pay

## 2020-12-27 VITALS — BP 150/60 | HR 92 | Wt 128.2 lb

## 2020-12-27 DIAGNOSIS — R609 Edema, unspecified: Secondary | ICD-10-CM | POA: Diagnosis present

## 2020-12-27 DIAGNOSIS — I50810 Right heart failure, unspecified: Secondary | ICD-10-CM

## 2020-12-27 DIAGNOSIS — I3139 Other pericardial effusion (noninflammatory): Secondary | ICD-10-CM | POA: Diagnosis not present

## 2020-12-27 DIAGNOSIS — Z20822 Contact with and (suspected) exposure to covid-19: Secondary | ICD-10-CM | POA: Diagnosis not present

## 2020-12-27 DIAGNOSIS — I6523 Occlusion and stenosis of bilateral carotid arteries: Secondary | ICD-10-CM | POA: Insufficient documentation

## 2020-12-27 DIAGNOSIS — I110 Hypertensive heart disease with heart failure: Secondary | ICD-10-CM | POA: Diagnosis not present

## 2020-12-27 DIAGNOSIS — Z09 Encounter for follow-up examination after completed treatment for conditions other than malignant neoplasm: Secondary | ICD-10-CM | POA: Diagnosis not present

## 2020-12-27 DIAGNOSIS — Z8616 Personal history of COVID-19: Secondary | ICD-10-CM | POA: Diagnosis not present

## 2020-12-27 DIAGNOSIS — I5032 Chronic diastolic (congestive) heart failure: Secondary | ICD-10-CM | POA: Insufficient documentation

## 2020-12-27 DIAGNOSIS — Z7901 Long term (current) use of anticoagulants: Secondary | ICD-10-CM | POA: Insufficient documentation

## 2020-12-27 DIAGNOSIS — Z86718 Personal history of other venous thrombosis and embolism: Secondary | ICD-10-CM | POA: Insufficient documentation

## 2020-12-27 DIAGNOSIS — Z87891 Personal history of nicotine dependence: Secondary | ICD-10-CM | POA: Diagnosis not present

## 2020-12-27 DIAGNOSIS — I7300 Raynaud's syndrome without gangrene: Secondary | ICD-10-CM | POA: Diagnosis not present

## 2020-12-27 DIAGNOSIS — D509 Iron deficiency anemia, unspecified: Secondary | ICD-10-CM | POA: Diagnosis not present

## 2020-12-27 DIAGNOSIS — Z79899 Other long term (current) drug therapy: Secondary | ICD-10-CM | POA: Diagnosis not present

## 2020-12-27 DIAGNOSIS — I272 Pulmonary hypertension, unspecified: Secondary | ICD-10-CM | POA: Diagnosis not present

## 2020-12-27 LAB — COMPREHENSIVE METABOLIC PANEL
ALT: 7 U/L (ref 0–44)
AST: 15 U/L (ref 15–41)
Albumin: 2.6 g/dL — ABNORMAL LOW (ref 3.5–5.0)
Alkaline Phosphatase: 54 U/L (ref 38–126)
Anion gap: 5 (ref 5–15)
BUN: 14 mg/dL (ref 8–23)
CO2: 25 mmol/L (ref 22–32)
Calcium: 8.5 mg/dL — ABNORMAL LOW (ref 8.9–10.3)
Chloride: 108 mmol/L (ref 98–111)
Creatinine, Ser: 0.82 mg/dL (ref 0.44–1.00)
GFR, Estimated: >60 mL/min (ref >60)
Glucose, Bld: 145 mg/dL — ABNORMAL HIGH (ref 70–99)
Potassium: 4.4 mmol/L (ref 3.5–5.1)
Sodium: 138 mmol/L (ref 135–145)
Total Bilirubin: 0.6 mg/dL (ref 0.3–1.2)
Total Protein: 7 g/dL (ref 6.5–8.1)

## 2020-12-27 LAB — CBC
HCT: 31.9 % — ABNORMAL LOW (ref 36.0–46.0)
Hemoglobin: 9.3 g/dL — ABNORMAL LOW (ref 12.0–15.0)
MCH: 20.9 pg — ABNORMAL LOW (ref 26.0–34.0)
MCHC: 29.2 g/dL — ABNORMAL LOW (ref 30.0–36.0)
MCV: 71.5 fL — ABNORMAL LOW (ref 80.0–100.0)
Platelets: 334 10*3/uL (ref 150–400)
RBC: 4.46 MIL/uL (ref 3.87–5.11)
RDW: 19.8 % — ABNORMAL HIGH (ref 11.5–15.5)
WBC: 4.2 10*3/uL (ref 4.0–10.5)
nRBC: 0 % (ref 0.0–0.2)

## 2020-12-27 LAB — BRAIN NATRIURETIC PEPTIDE: B Natriuretic Peptide: 372 pg/mL — ABNORMAL HIGH (ref 0.0–100.0)

## 2020-12-27 LAB — IRON AND TIBC
Iron: 13 ug/dL — ABNORMAL LOW (ref 28–170)
Saturation Ratios: 4 % — ABNORMAL LOW (ref 10.4–31.8)
TIBC: 340 ug/dL (ref 250–450)
UIBC: 327 ug/dL

## 2020-12-27 LAB — FERRITIN: Ferritin: 9 ng/mL — ABNORMAL LOW (ref 11–307)

## 2020-12-27 MED ORDER — SPIRONOLACTONE 25 MG PO TABS: 25.0000 mg | ORAL_TABLET | Freq: Every day | ORAL | 3 refills | Status: DC

## 2020-12-27 MED ORDER — SILDENAFIL CITRATE 20 MG PO TABS: 60.0000 mg | ORAL_TABLET | Freq: Three times a day (TID) | ORAL | 3 refills | Status: DC

## 2020-12-27 NOTE — Patient Instructions (Addendum)
Continue furosemide, Start Sprionolactone 61m Daily  Labs done today, your results will be available in MyChart, we will contact you for abnormal readings.  Your physician recommends that you schedule a follow-up appointment in: 3 months  You are scheduled for a Cardiac Catheterization on Friday, December 16 with Dr. DGlori Bickers  1. [facility] [address] [place] [id number] 11:30 AM (This time is two hours before your procedure to ensure your preparation). Free valet parking service is available.   Special note: Every effort is made to have your procedure done on time. Please understand that emergencies sometimes delay scheduled procedures.  2. Diet: Do not eat solid foods after midnight.  The patient may have clear liquids until 5am upon the day of the procedure.  3. Labs: You will need to have blood drawn on Friday,  4. Medication instructions in preparation for your procedure:   Contrast Allergy: No   DON'T TAKE YOUR FUROSEMIDE OR SPIRONOLACTONE  ON Friday 12/31/2020   On the morning of your procedure, take your morning medicines NOT listed above.  You may use sips of water.  5. Plan for one night stay--bring personal belongings. 6. Bring a current list of your medications and current insurance cards. 7. You MUST have a responsible person to drive you home. 8. Someone MUST be with you the first 24 hours after you arrive home or your discharge will be delayed. 9. Please wear clothes that are easy to get on and off and wear slip-on shoes.  If you have any questions or concerns before your next appointment please send u[place] message through m[place]or call our office at [phone]    TO LEAVE A MESSAGE FOR THE NURSE SELECT OPTION 2, PLEASE LEAVE A MESSAGE INCLUDING: YOUR NAME DATE OF BIRTH CALL BACK NUMBER REASON FOR CALL**this is important as we prioritize the call backs  YOU WILL RECEIVE A  CALL BACK THE SAME DAY AS LONG AS YOU CALL BEFORE 4:00 PM  [facility] you and your health needs are our priority. As part of our continuing mission to provide you with exceptional heart care, we have created designated Provider Care Teams. These Care Teams include your primary Cardiologist (physician) and Advanced Practice Providers (APPs- Physician Assistants and Nurse Practitioners) who all work together to provide you with the care you need, when you need it.   You may see any of the following providers on your designated Care Team at your next follow up: Dr DGlori BickersDr DHaynes Kerns NP BLyda Jester [facility] PUtahLAudry Riles PharmD   Please be sure to bring in all your medications bottles to every appointment.

## 2020-12-29 ENCOUNTER — Other Ambulatory Visit (HOSPITAL_COMMUNITY): Payer: Self-pay

## 2020-12-29 ENCOUNTER — Telehealth (HOSPITAL_COMMUNITY): Payer: Self-pay | Admitting: Pharmacist

## 2020-12-29 ENCOUNTER — Other Ambulatory Visit (HOSPITAL_COMMUNITY): Payer: Self-pay | Admitting: Internal Medicine

## 2020-12-29 NOTE — Telephone Encounter (Signed)
Patient Advocate Encounter   Received notification from [facility] that prior authorization for sildenafil is required.   PA submitted on CoverMyMeds Key [id number] Status is pending   Will continue to follow.   Audry Riles, PharmD, BCPS, BCCP, CPP Heart Failure Clinic Pharmacist [phone]

## 2020-12-29 NOTE — Telephone Encounter (Signed)
Advanced Heart Failure Patient Advocate Encounter  Prior Authorization for sildenafil has been approved.    Effective dates: 01/17/20 through 01/15/21  Audry Riles, PharmD, BCPS, BCCP, CPP Heart Failure Clinic Pharmacist [phone]

## 2020-12-30 ENCOUNTER — Other Ambulatory Visit: Payer: Self-pay

## 2020-12-30 ENCOUNTER — Ambulatory Visit (INDEPENDENT_AMBULATORY_CARE_PROVIDER_SITE_OTHER): Payer: Medicare HMO | Admitting: Pulmonary Disease

## 2020-12-30 DIAGNOSIS — I270 Primary pulmonary hypertension: Secondary | ICD-10-CM | POA: Diagnosis not present

## 2020-12-30 LAB — PULMONARY FUNCTION TEST
DL/VA % pred: 76 %
DL/VA: 3.15 ml/min/mmHg/L
DLCO cor % pred: 53 %
DLCO cor: 10.32 ml/min/mmHg
DLCO unc % pred: 45 %
DLCO unc: 8.73 ml/min/mmHg
FEF 25-75 Post: 1.23 L/sec
FEF 25-75 Pre: 1.03 L/sec
FEF2575-%Change-Post: 19 %
FEF2575-%Pred-Post: 70 %
FEF2575-%Pred-Pre: 58 %
FEV1-%Change-Post: 2 %
FEV1-%Pred-Post: 55 %
FEV1-%Pred-Pre: 54 %
FEV1-Post: 1.21 L
FEV1-Pre: 1.18 L
FEV1FVC-%Change-Post: 1 %
FEV1FVC-%Pred-Pre: 104 %
FEV6-%Change-Post: 1 %
FEV6-%Pred-Post: 55 %
FEV6-%Pred-Pre: 54 %
FEV6-Post: 1.51 L
FEV6-Pre: 1.5 L
FEV6FVC-%Pred-Post: 104 %
FEV6FVC-%Pred-Pre: 104 %
FVC-%Change-Post: 1 %
FVC-%Pred-Post: 52 %
FVC-%Pred-Pre: 51 %
FVC-Post: 1.51 L
FVC-Pre: 1.5 L
Post FEV1/FVC ratio: 80 %
Post FEV6/FVC ratio: 100 %
Pre FEV1/FVC ratio: 79 %
Pre FEV6/FVC Ratio: 100 %
RV % pred: 87 %
RV: 1.96 L
TLC % pred: 67 %
TLC: 3.42 L

## 2020-12-30 NOTE — Progress Notes (Signed)
Full PFT performed today.

## 2020-12-30 NOTE — Patient Instructions (Signed)
Full PFT performed today. °

## 2020-12-31 ENCOUNTER — Ambulatory Visit (HOSPITAL_COMMUNITY)
Admission: RE | Admit: 2020-12-31 | Discharge: 2020-12-31 | Disposition: A | Discharge status: Home / Self Care | Payer: Medicare HMO | Attending: Internal Medicine | Admitting: Internal Medicine

## 2020-12-31 ENCOUNTER — Other Ambulatory Visit: Payer: Self-pay

## 2020-12-31 ENCOUNTER — Ambulatory Visit (HOSPITAL_COMMUNITY)
Admission: RE | Disposition: A | Discharge status: Home / Self Care | Payer: Self-pay | Source: Home / Self Care | Attending: Internal Medicine

## 2020-12-31 DIAGNOSIS — Z8616 Personal history of COVID-19: Secondary | ICD-10-CM | POA: Insufficient documentation

## 2020-12-31 DIAGNOSIS — I110 Hypertensive heart disease with heart failure: Secondary | ICD-10-CM | POA: Diagnosis not present

## 2020-12-31 DIAGNOSIS — I2721 Secondary pulmonary arterial hypertension: Secondary | ICD-10-CM | POA: Diagnosis not present

## 2020-12-31 DIAGNOSIS — I5032 Chronic diastolic (congestive) heart failure: Secondary | ICD-10-CM | POA: Diagnosis not present

## 2020-12-31 DIAGNOSIS — I3139 Other pericardial effusion (noninflammatory): Secondary | ICD-10-CM | POA: Diagnosis not present

## 2020-12-31 DIAGNOSIS — Z79899 Other long term (current) drug therapy: Secondary | ICD-10-CM | POA: Insufficient documentation

## 2020-12-31 DIAGNOSIS — I6523 Occlusion and stenosis of bilateral carotid arteries: Secondary | ICD-10-CM | POA: Insufficient documentation

## 2020-12-31 DIAGNOSIS — I50810 Right heart failure, unspecified: Secondary | ICD-10-CM

## 2020-12-31 DIAGNOSIS — D509 Iron deficiency anemia, unspecified: Secondary | ICD-10-CM | POA: Insufficient documentation

## 2020-12-31 HISTORY — PX: RIGHT HEART CATH: CATH118263

## 2020-12-31 LAB — POCT I-STAT EG7
Acid-Base Excess: 1 mmol/L (ref 0.0–2.0)
Acid-Base Excess: 1 mmol/L (ref 0.0–2.0)
Acid-base deficit: 2 mmol/L (ref 0.0–2.0)
Bicarbonate: 25.4 mmol/L (ref 20.0–28.0)
Bicarbonate: 22.7 mmol/L (ref 20.0–28.0)
Bicarbonate: 25.3 mmol/L (ref 20.0–28.0)
Calcium, Ion: 1.21 mmol/L (ref 1.15–1.40)
Calcium, Ion: 1.01 mmol/L — ABNORMAL LOW (ref 1.15–1.40)
Calcium, Ion: 1.21 mmol/L (ref 1.15–1.40)
HCT: 30 % — ABNORMAL LOW (ref 36.0–46.0)
HCT: 27 % — ABNORMAL LOW (ref 36.0–46.0)
HCT: 31 % — ABNORMAL LOW (ref 36.0–46.0)
Hemoglobin: 10.2 g/dL — ABNORMAL LOW (ref 12.0–15.0)
Hemoglobin: 10.5 g/dL — ABNORMAL LOW (ref 12.0–15.0)
Hemoglobin: 9.2 g/dL — ABNORMAL LOW (ref 12.0–15.0)
O2 Saturation: 68 %
O2 Saturation: 69 %
O2 Saturation: 76 %
Potassium: 4 mmol/L (ref 3.5–5.1)
Potassium: 3.5 mmol/L (ref 3.5–5.1)
Potassium: 4 mmol/L (ref 3.5–5.1)
Sodium: 139 mmol/L (ref 135–145)
Sodium: 139 mmol/L (ref 135–145)
Sodium: 143 mmol/L (ref 135–145)
TCO2: 27 mmol/L (ref 22–32)
TCO2: 24 mmol/L (ref 22–32)
TCO2: 27 mmol/L (ref 22–32)
pCO2, Ven: 38.8 mmHg — ABNORMAL LOW (ref 44.0–60.0)
pCO2, Ven: 36.1 mmHg — ABNORMAL LOW (ref 44.0–60.0)
pCO2, Ven: 38.8 mmHg — ABNORMAL LOW (ref 44.0–60.0)
pH, Ven: 7.425 (ref 7.250–7.430)
pH, Ven: 7.407 (ref 7.250–7.430)
pH, Ven: 7.423 (ref 7.250–7.430)
pO2, Ven: 34 mmHg (ref 32.0–45.0)
pO2, Ven: 35 mmHg (ref 32.0–45.0)
pO2, Ven: 40 mmHg (ref 32.0–45.0)

## 2020-12-31 LAB — BASIC METABOLIC PANEL
Anion gap: 4 — ABNORMAL LOW (ref 5–15)
BUN: 15 mg/dL (ref 8–23)
CO2: 25 mmol/L (ref 22–32)
Calcium: 8.7 mg/dL — ABNORMAL LOW (ref 8.9–10.3)
Chloride: 103 mmol/L (ref 98–111)
Creatinine, Ser: 0.98 mg/dL (ref 0.44–1.00)
GFR, Estimated: >60 mL/min (ref >60)
Glucose, Bld: 120 mg/dL — ABNORMAL HIGH (ref 70–99)
Potassium: 3.9 mmol/L (ref 3.5–5.1)
Sodium: 132 mmol/L — ABNORMAL LOW (ref 135–145)

## 2020-12-31 SURGERY — RIGHT HEART CATH
Anesthesia: LOCAL

## 2020-12-31 MED ORDER — MIDAZOLAM HCL 2 MG/2ML IJ SOLN
INTRAMUSCULAR | Status: DC | PRN
  Administered 2020-12-31: 1 mg via INTRAVENOUS

## 2020-12-31 MED ORDER — SODIUM CHLORIDE 0.9 % IV SOLN: 250.0000 mL | INTRAVENOUS | Status: DC | PRN

## 2020-12-31 MED ORDER — ONDANSETRON HCL 4 MG/2ML IJ SOLN: 4.0000 mg | Freq: Four times a day (QID) | INTRAMUSCULAR | Status: DC | PRN

## 2020-12-31 MED ORDER — SODIUM CHLORIDE 0.9 % IV SOLN: INTRAVENOUS | Status: DC

## 2020-12-31 MED ORDER — LIDOCAINE HCL (PF) 1 % IJ SOLN
INTRAMUSCULAR | Status: DC | PRN
  Administered 2020-12-31: 2 mL via INTRADERMAL

## 2020-12-31 MED ORDER — ASPIRIN 81 MG PO CHEW
CHEWABLE_TABLET | ORAL | Status: AC
  Filled 2020-12-31: qty 1

## 2020-12-31 MED ORDER — ASPIRIN 81 MG PO CHEW
81.0000 mg | CHEWABLE_TABLET | ORAL | Status: AC
  Administered 2020-12-31: 81 mg via ORAL

## 2020-12-31 MED ORDER — LIDOCAINE HCL (PF) 1 % IJ SOLN
INTRAMUSCULAR | Status: AC
  Filled 2020-12-31: qty 30

## 2020-12-31 MED ORDER — ACETAMINOPHEN 325 MG PO TABS: 650.0000 mg | ORAL_TABLET | ORAL | Status: DC | PRN

## 2020-12-31 MED ORDER — HEPARIN (PORCINE) IN NACL 2000-0.9 UNIT/L-% IV SOLN
INTRAVENOUS | Status: DC | PRN
  Administered 2020-12-31: 1000 mL

## 2020-12-31 MED ORDER — FENTANYL CITRATE (PF) 100 MCG/2ML IJ SOLN
INTRAMUSCULAR | Status: DC | PRN
  Administered 2020-12-31: 25 ug via INTRAVENOUS

## 2020-12-31 MED ORDER — SODIUM CHLORIDE 0.9% FLUSH: 3.0000 mL | INTRAVENOUS | Status: DC | PRN

## 2020-12-31 MED ORDER — HEPARIN (PORCINE) IN NACL 1000-0.9 UT/500ML-% IV SOLN
INTRAVENOUS | Status: AC
  Filled 2020-12-31: qty 1000

## 2020-12-31 MED ORDER — HYDRALAZINE HCL 20 MG/ML IJ SOLN: 10.0000 mg | INTRAMUSCULAR | Status: DC | PRN

## 2020-12-31 MED ORDER — LABETALOL HCL 5 MG/ML IV SOLN: 10.0000 mg | INTRAVENOUS | Status: DC | PRN

## 2020-12-31 MED ORDER — SODIUM CHLORIDE 0.9% FLUSH: 3.0000 mL | Freq: Two times a day (BID) | INTRAVENOUS | Status: DC

## 2020-12-31 MED ORDER — FENTANYL CITRATE (PF) 100 MCG/2ML IJ SOLN
INTRAMUSCULAR | Status: AC
  Filled 2020-12-31: qty 2

## 2020-12-31 MED ORDER — MIDAZOLAM HCL 2 MG/2ML IJ SOLN
INTRAMUSCULAR | Status: AC
  Filled 2020-12-31: qty 2

## 2020-12-31 SURGICAL SUPPLY — 9 items
CATH SWAN GANZ 7F STRAIGHT (CATHETERS) ×2 IMPLANT
GLIDESHEATH SLENDER 7FR .021G (SHEATH) ×2 IMPLANT
GUIDEWIRE .025 260CM (WIRE) ×2 IMPLANT
PACK CARDIAC CATHETERIZATION (CUSTOM PROCEDURE TRAY) ×3 IMPLANT
PROTECTION STATION PRESSURIZED (MISCELLANEOUS) ×3
SHEATH PINNACLE 7F 10CM (SHEATH) ×2 IMPLANT
STATION PROTECTION PRESSURIZED (MISCELLANEOUS) IMPLANT
TRANSDUCER W/STOPCOCK (MISCELLANEOUS) ×3 IMPLANT
WIRE MICRO SET SILHO 5FR 7 (SHEATH) ×2 IMPLANT

## 2020-12-31 NOTE — Telephone Encounter (Signed)
This pa has been approved

## 2020-12-31 NOTE — Interval H&P Note (Signed)
History and Physical Interval Note:  12/31/2020 1:22 PM  Jacqueline Harris  has presented today for surgery, with the diagnosis of heart failure.  The various methods of treatment have been discussed with the patient and family. After consideration of risks, benefits and other options for treatment, the patient has consented to  Procedure(s): RIGHT HEART CATH (N/A) as a surgical intervention.  The patient's history has been reviewed, patient examined, no change in status, stable for surgery.  I have reviewed the patient's chart and labs.  Questions were answered to the patient's satisfaction.     [name]

## 2021-01-01 ENCOUNTER — Encounter (HOSPITAL_COMMUNITY): Payer: Self-pay

## 2021-01-01 ENCOUNTER — Encounter (HOSPITAL_COMMUNITY): Payer: Self-pay | Admitting: Internal Medicine

## 2021-01-03 ENCOUNTER — Telehealth: Payer: Self-pay | Admitting: Pulmonary Disease

## 2021-01-03 ENCOUNTER — Ambulatory Visit (HOSPITAL_COMMUNITY): Payer: Medicare HMO | Admitting: Anesthesiology

## 2021-01-03 ENCOUNTER — Encounter (HOSPITAL_COMMUNITY): Payer: Self-pay | Admitting: Internal Medicine

## 2021-01-03 ENCOUNTER — Other Ambulatory Visit: Payer: Self-pay | Admitting: Internal Medicine

## 2021-01-03 ENCOUNTER — Other Ambulatory Visit: Payer: Self-pay

## 2021-01-03 ENCOUNTER — Encounter (HOSPITAL_COMMUNITY)
Admission: RE | Disposition: A | Discharge status: Home / Self Care | Payer: Self-pay | Source: Ambulatory Visit | Attending: Internal Medicine

## 2021-01-03 ENCOUNTER — Telehealth: Payer: Self-pay

## 2021-01-03 ENCOUNTER — Ambulatory Visit (HOSPITAL_COMMUNITY)
Admission: RE | Admit: 2021-01-03 | Discharge: 2021-01-03 | Disposition: A | Discharge status: Home / Self Care | Payer: Medicare HMO | Source: Ambulatory Visit | Attending: Internal Medicine | Admitting: Internal Medicine

## 2021-01-03 DIAGNOSIS — I509 Heart failure, unspecified: Secondary | ICD-10-CM | POA: Diagnosis not present

## 2021-01-03 DIAGNOSIS — K31811 Angiodysplasia of stomach and duodenum with bleeding: Secondary | ICD-10-CM | POA: Insufficient documentation

## 2021-01-03 DIAGNOSIS — Z87891 Personal history of nicotine dependence: Secondary | ICD-10-CM | POA: Insufficient documentation

## 2021-01-03 DIAGNOSIS — K31819 Angiodysplasia of stomach and duodenum without bleeding: Secondary | ICD-10-CM | POA: Diagnosis not present

## 2021-01-03 DIAGNOSIS — K449 Diaphragmatic hernia without obstruction or gangrene: Secondary | ICD-10-CM | POA: Insufficient documentation

## 2021-01-03 DIAGNOSIS — I110 Hypertensive heart disease with heart failure: Secondary | ICD-10-CM | POA: Diagnosis not present

## 2021-01-03 DIAGNOSIS — R1310 Dysphagia, unspecified: Secondary | ICD-10-CM | POA: Insufficient documentation

## 2021-01-03 DIAGNOSIS — K209 Esophagitis, unspecified without bleeding: Secondary | ICD-10-CM | POA: Diagnosis not present

## 2021-01-03 DIAGNOSIS — K2100 Gastro-esophageal reflux disease with esophagitis, without bleeding: Secondary | ICD-10-CM

## 2021-01-03 DIAGNOSIS — D509 Iron deficiency anemia, unspecified: Secondary | ICD-10-CM | POA: Insufficient documentation

## 2021-01-03 DIAGNOSIS — B3781 Candidal esophagitis: Secondary | ICD-10-CM | POA: Diagnosis not present

## 2021-01-03 DIAGNOSIS — K2091 Esophagitis, unspecified with bleeding: Secondary | ICD-10-CM | POA: Diagnosis not present

## 2021-01-03 DIAGNOSIS — K319 Disease of stomach and duodenum, unspecified: Secondary | ICD-10-CM | POA: Diagnosis not present

## 2021-01-03 DIAGNOSIS — R1011 Right upper quadrant pain: Secondary | ICD-10-CM

## 2021-01-03 DIAGNOSIS — K922 Gastrointestinal hemorrhage, unspecified: Secondary | ICD-10-CM

## 2021-01-03 DIAGNOSIS — I270 Primary pulmonary hypertension: Secondary | ICD-10-CM

## 2021-01-03 DIAGNOSIS — I2510 Atherosclerotic heart disease of native coronary artery without angina pectoris: Secondary | ICD-10-CM | POA: Diagnosis not present

## 2021-01-03 HISTORY — PX: BIOPSY: SHX5522

## 2021-01-03 HISTORY — PX: ESOPHAGOGASTRODUODENOSCOPY (EGD) WITH PROPOFOL: SHX5813

## 2021-01-03 HISTORY — PX: RADIOFREQUENCY ABLATION: SHX2290

## 2021-01-03 SURGERY — ESOPHAGOGASTRODUODENOSCOPY (EGD) WITH PROPOFOL
Anesthesia: Monitor Anesthesia Care

## 2021-01-03 MED ORDER — PROPOFOL 500 MG/50ML IV EMUL
INTRAVENOUS | Status: DC | PRN
  Administered 2021-01-03: 09:00:00 30 mg via INTRAVENOUS

## 2021-01-03 MED ORDER — LACTATED RINGERS IV SOLN: INTRAVENOUS | Status: DC

## 2021-01-03 MED ORDER — OMEPRAZOLE MAGNESIUM 20 MG PO TBEC: 40.0000 mg | DELAYED_RELEASE_TABLET | Freq: Two times a day (BID) | ORAL | 1 refills | Status: DC

## 2021-01-03 MED ORDER — OMEPRAZOLE 40 MG PO CPDR
40.0000 mg | DELAYED_RELEASE_CAPSULE | Freq: Two times a day (BID) | ORAL | 0 refills | Status: DC
Start: 2021-01-03 — End: 2021-03-08

## 2021-01-03 MED ORDER — PROPOFOL 500 MG/50ML IV EMUL
INTRAVENOUS | Status: AC
  Filled 2021-01-03: qty 50

## 2021-01-03 MED ORDER — SUCRALFATE 1 GM/10ML PO SUSP: 1.0000 g | Freq: Four times a day (QID) | ORAL | 0 refills | Status: DC

## 2021-01-03 MED ORDER — PROPOFOL 500 MG/50ML IV EMUL
INTRAVENOUS | Status: DC | PRN
  Administered 2021-01-03: 135 ug/kg/min via INTRAVENOUS

## 2021-01-03 MED ORDER — SODIUM CHLORIDE 0.9 % IV SOLN: INTRAVENOUS | Status: DC

## 2021-01-03 SURGICAL SUPPLY — 15 items

## 2021-01-03 NOTE — Telephone Encounter (Signed)
Per Dr. Libby Maw request, pt has been scheduled for repeat EGD @ [facility] on 03/14/21 @ 945am. Details about appt and prep instructions have been sent via My Chart. Amb referral placed for auth purposes. Pt is currently at [facility] recovering from EGD. Appt will reflect on AVS upon hosp discharge for pt future reference. Will call to f/u with pt tomorrow to ensure she has read the information I have sent.

## 2021-01-03 NOTE — Discharge Instructions (Signed)
YOU HAD AN ENDOSCOPIC PROCEDURE TODAY: Refer to the procedure report and other information in the discharge instructions given to you for any specific questions about what was found during the examination. If this information does not answer your questions, please call [place] office at [phone] to clarify.   YOU SHOULD EXPECT: Some feelings of bloating in the abdomen. Passage of more gas than usual. Walking can help get rid of the air that was put into your GI tract during the procedure and reduce the bloating. If you had a lower endoscopy (such as a colonoscopy or flexible sigmoidoscopy) you may notice spotting of blood in your stool or on the toilet paper. Some abdominal soreness may be present for a day or two, also.  DIET: Your first meal following the procedure should be a light meal and then it is ok to progress to your normal diet. A half-sandwich or bowl of soup is an example of a good first meal. Heavy or fried foods are harder to digest and may make you feel nauseous or bloated. Drink plenty of fluids but you should avoid alcoholic beverages for 24 hours. If you had a esophageal dilation, please see attached instructions for diet.    ACTIVITY: Your care partner should take you home directly after the procedure. You should plan to take it easy, moving slowly for the rest of the day. You can resume normal activity the day after the procedure however YOU SHOULD NOT DRIVE, use power tools, machinery or perform tasks that involve climbing or major physical exertion for 24 hours (because of the sedation medicines used during the test).   SYMPTOMS TO REPORT IMMEDIATELY: A gastroenterologist can be reached at any hour. Please call [phone]  for any of the following symptoms:  Following upper endoscopy (EGD, EUS, ERCP, esophageal dilation) Vomiting of blood or coffee ground material  New, significant abdominal pain  New, significant chest pain or pain under the shoulder blades  Painful or  persistently difficult swallowing  New shortness of breath  Black, tarry-looking or red, bloody stools  FOLLOW UP:  If any biopsies were taken you will be contacted by phone or by letter within the next 1-3 weeks. Call [phone]  if you have not heard about the biopsies in 3 weeks.  Please also call with any specific questions about appointments or follow up tests.

## 2021-01-03 NOTE — Telephone Encounter (Signed)
Appears pt has read My Chart message pertaining to scheduled procedure and prep instructions.  Last read by Davy Pique at  3:41 PM on 01/03/2021.  Last read by Budd Palmer at 11:06 AM on 01/03/2021.

## 2021-01-03 NOTE — Anesthesia Postprocedure Evaluation (Signed)
Anesthesia Post Note  Patient: Jacqueline Harris  Procedure(s) Performed: ESOPHAGOGASTRODUODENOSCOPY (EGD) WITH PROPOFOL RADIO FREQUENCY ABLATION BIOPSY     Patient location during evaluation: PACU Anesthesia Type: MAC Level of consciousness: awake and alert and oriented Pain management: pain level controlled Vital Signs Assessment: post-procedure vital signs reviewed and stable Respiratory status: spontaneous breathing, nonlabored ventilation and respiratory function stable Cardiovascular status: blood pressure returned to baseline Postop Assessment: no apparent nausea or vomiting Anesthetic complications: no   No notable events documented.  Last Vitals:  Vitals:   01/03/21 1010 01/03/21 1020  BP: (!) 126/41 (!) 137/55  Pulse: 80 80  Resp: (!) 27 (!) 24  Temp:    SpO2: 100% 96%    Last Pain:  Vitals:   01/03/21 1020  TempSrc:   PainSc: 0-No pain                 Marthenia Rolling

## 2021-01-03 NOTE — Progress Notes (Signed)
Tried calling the pt and there was no answer- LMTCB

## 2021-01-03 NOTE — Telephone Encounter (Signed)
-----  Message from Sharyn Creamer, MD sent at 01/03/2021 10:15 AM EST ----- Hi Milinda Sweeney, please arrange for repeat EGD with me in 2 months to assess for healing of esophagitis.  Thanks, Lyndee Leo

## 2021-01-03 NOTE — Transfer of Care (Signed)
Immediate Anesthesia Transfer of Care Note  Patient: Jacqueline Harris  Procedure(s) Performed: Procedure(s): ESOPHAGOGASTRODUODENOSCOPY (EGD) WITH PROPOFOL (N/A) RADIO FREQUENCY ABLATION BIOPSY  Patient Location: PACU  Anesthesia Type:MAC  Level of Consciousness:  sedated, patient cooperative and responds to stimulation  Airway & Oxygen Therapy:Patient Spontanous Breathing and Patient connected to face mask oxgen  Post-op Assessment:  Report given to PACU RN and Post -op Vital signs reviewed and stable  Post vital signs:  Reviewed and stable  Last Vitals:  Vitals:   01/03/21 0850  BP: (!) 151/45  Pulse: 97  Resp: 19  Temp: 36.8 C  SpO2: 16%    Complications: No apparent anesthesia complications

## 2021-01-03 NOTE — Telephone Encounter (Signed)
RA please advise on the results of the PFT.  thanks

## 2021-01-03 NOTE — Anesthesia Preprocedure Evaluation (Addendum)
Anesthesia Evaluation  Patient identified by MRN, date of birth, ID band Patient awake    Reviewed: Allergy & Precautions, NPO status , Patient's Chart, lab work & pertinent test results  History of Anesthesia Complications Negative for: history of anesthetic complications  Airway Mallampati: II  TM Distance: >3 FB Neck ROM: Full    Dental  (+) Missing,    Pulmonary former smoker,    Pulmonary exam normal        Cardiovascular hypertension, + CAD and +CHF  Normal cardiovascular exam  TTE: EF 60-65%, mild LVH, grade I DD, RV volume overload, RV systolic function mildly reduced, RV mildly-to-moderately enlarged, severely elevated PASP 77.6 mmHg, moderate RAE, large circumferential pericardial effusion with largest diameter posterior to the LV measuring 2.3cm at end diastole, no evidence of cardiac tamponade, moderate TR   Neuro/Psych negative neurological ROS  negative psych ROS   GI/Hepatic negative GI ROS, Neg liver ROS,   Endo/Other  negative endocrine ROS  Renal/GU negative Renal ROS  negative genitourinary   Musculoskeletal  (+) Arthritis ,   Abdominal   Peds  Hematology  (+) anemia , Hgb 9.2   Anesthesia Other Findings Day of surgery medications reviewed with patient.  Reproductive/Obstetrics negative OB ROS                           Anesthesia Physical Anesthesia Plan  ASA: 4  Anesthesia Plan: MAC   Post-op Pain Management: Minimal or no pain anticipated   Induction:   PONV Risk Score and Plan: 2 and Treatment may vary due to age or medical condition and Propofol infusion  Airway Management Planned: Natural Airway and Nasal Cannula  Additional Equipment: None  Intra-op Plan:   Post-operative Plan:   Informed Consent: I have reviewed the patients History and Physical, chart, labs and discussed the procedure including the risks, benefits and alternatives for the proposed  anesthesia with the patient or authorized representative who has indicated his/her understanding and acceptance.       Plan Discussed with: CRNA  Anesthesia Plan Comments:       Anesthesia Quick Evaluation

## 2021-01-03 NOTE — H&P (Addendum)
GASTROENTEROLOGY PROCEDURE H&P NOTE   Primary Care Physician: Glenda Chroman, MD    Reason for Procedure:   IDA, dysphagia  Plan:    EGD  Patient is appropriate for endoscopic procedure(s) in the hospital setting.  The nature of the procedure, as well as the risks, benefits, and alternatives were carefully and thoroughly reviewed with the patient. Ample time for discussion and questions allowed. The patient understood, was satisfied, and agreed to proceed.     HPI: Jacqueline Harris is a 75 y.o. female who presents for EGD for IDA and dysphagia. She was last seen in clinic on 12/23/20.  Past Medical History:  Diagnosis Date   Acute deep vein thrombosis (DVT) of left peroneal vein [facility])    November 2021   Anemia    Arthritis    CHF (congestive heart failure) (HCC)    Colon polyps    Essential hypertension    GAVE (gastric antral vascular ectasia)    HLD (hyperlipidemia)    Pneumonia due to COVID-19 virus    November 2021   Prediabetes     Past Surgical History:  Procedure Laterality Date   CHOLECYSTECTOMY     COLONOSCOPY N/A 12/03/2014   Procedure: COLONOSCOPY;  Surgeon: Rogene Houston, MD;  Location: [facility]O [facility];  Service: Endoscopy;  Laterality: N/A;  830   COLONOSCOPY WITH PROPOFOL N/A 03/10/2020   Procedure: COLONOSCOPY WITH PROPOFOL;  Surgeon: Rogene Houston, MD;  Location: [facility];  Service: Endoscopy;  Laterality: N/A;  1:15   POLYPECTOMY  03/10/2020   Procedure: POLYPECTOMY;  Surgeon: Rogene Houston, MD;  Location: [facility];  Service: Endoscopy;;   RIGHT HEART CATH N/A 04/20/2020   Procedure: RIGHT HEART CATH;  Surgeon: Jolaine Artist, MD;  Location: [place] [facility];  Service: Cardiovascular;  Laterality: N/A;   RIGHT HEART CATH N/A 12/31/2020   Procedure: RIGHT HEART CATH;  Surgeon: Jolaine Artist, MD;  Location: [facility];  Service: Cardiovascular;  Laterality: N/A;    Prior to Admission medications   Medication Sig  Start Date End Date Taking? Authorizing Provider  acetaminophen (TYLENOL) 650 MG CR tablet Take 650 mg by mouth every 8 (eight) hours as needed for pain.    [provider]  ferrous sulfate 324 (65 Fe) MG TBEC Take 1 tablet (325 mg total) by mouth daily. 12/23/20   Sharyn Creamer, MD  furosemide (LASIX) 20 MG tablet Take 20 mg by mouth daily.    [provider]  Multiple Vitamins-Minerals (MULTIVITAMIN WITH MINERALS) tablet Take 1 tablet by mouth daily.    [provider]  potassium chloride (KLOR-CON) 10 MEQ tablet Take 10 mEq by mouth daily.    [provider]  sildenafil (REVATIO) 20 MG tablet Take 3 tablets (60 mg total) by mouth 3 (three) times daily. 12/27/20   Bensimhon, Shaune Pascal, MD  spironolactone (ALDACTONE) 25 MG tablet Take 1 tablet (25 mg total) by mouth daily. 12/27/20 03/27/21  Bensimhon, Shaune Pascal, MD    Current Facility-Administered Medications  Medication Dose Route Frequency Provider Last Rate Last Admin   0.9 %  sodium chloride infusion   Intravenous Continuous Sharyn Creamer, MD       lactated ringers infusion   Intravenous Continuous Sharyn Creamer, MD        Allergies as of 12/23/2020   (No Known Allergies)    Family History  Problem Relation Age of Onset   Cancer - Lung  Mother    Bladder Cancer Father    Diabetes Father    COPD Father    Hypertension Sister    Heart disease Sister    Other Sister        GAVE   Diabetes Sister    Cirrhosis Sister    Other Sister        GAVE   Cancer - Colon Brother    Diabetes Brother    Heart disease Brother    Thyroid disease Son    Depression Son    Anxiety disorder Son    Hyperlipidemia Son     Social History   Socioeconomic History   Marital status: Divorced    Spouse name: Not on file   Number of children: 2   Years of education: Not on file   Highest education level: Not on file  Occupational History   Occupation: retired  Tobacco Use   Smoking status: Former     Packs/day: 0.50    Years: 13.00    Pack years: 6.50    Types: Cigarettes    Quit date: 1985    Years since quitting: 37.9   Smokeless tobacco: Never  Vaping Use   Vaping Use: Never used  Substance and Sexual Activity   Alcohol use: No   Drug use: No   Sexual activity: Not on file  Other Topics Concern   Not on file  Social History Narrative   Not on file   Social Determinants of Health   Financial Resource Strain: Not on file  Food Insecurity: Not on file  Transportation Needs: Not on file  Physical Activity: Not on file  Stress: Not on file  Social Connections: Not on file  Intimate Partner Violence: Not on file    Physical Exam: Vital signs in last 24 hours: There were no vitals taken for this visit. GEN: NAD EYE: Sclerae anicteric ENT: MMM CV: Non-tachycardic Pulm: No increased work of breathing GI: Soft, NT/ND NEURO:  Alert & Oriented   Christia Reading, MD [facility]  01/03/2021 8:35 AM

## 2021-01-03 NOTE — Op Note (Addendum)
[facility] Patient Name: Jacqueline Harris Procedure Date: 01/03/2021 MRN: [id number] Attending MD: Georgian Co ,  Date of Birth: December 29, 1945 CSN: [id number] Age: 75 Admit Type: Inpatient Procedure:                Upper GI endoscopy Indications:              Iron deficiency anemia, Dysphagia Providers:                [name]" Michaelle Birks, RN, Benetta Spar, Technician Referring MD:             Glenda Chroman Medicines:                Monitored Anesthesia Care Complications:            No immediate complications. Estimated Blood Loss:     Estimated blood loss was minimal. Procedure:                Pre-Anesthesia Assessment:                           - Prior to the procedure, a History and Physical                            was performed, and patient medications and                            allergies were reviewed. The patient's tolerance of                            previous anesthesia was also reviewed. The risks                            and benefits of the procedure and the sedation                            options and risks were discussed with the patient.                            All questions were answered, and informed consent                            was obtained. Prior Anticoagulants: The patient has                            taken no previous anticoagulant or antiplatelet                            agents. ASA Grade Assessment: III - A patient with                            severe systemic disease. After reviewing the risks  and benefits, the patient was deemed in                            satisfactory condition to undergo the procedure.                           After obtaining informed consent, the endoscope was                            passed under direct vision. Throughout the                            procedure, the patient's blood pressure, pulse, and                             oxygen saturations were monitored continuously. The                            GIF-H190 ([id number]) Olympus endoscope was introduced                            through the mouth, and advanced to the second part                            of duodenum. The upper GI endoscopy was                            accomplished without difficulty. The patient                            tolerated the procedure well. Scope In: Scope Out: Findings:      LA Grade D (one or more mucosal breaks involving at least 75% of       esophageal circumference) esophagitis with no bleeding was found in the       lower third of the esophagus. Biopsies were taken with a cold forceps       for histology.      A hiatal hernia was present.      A single localized erosion with stigmata of recent bleeding was found in       the gastric body.      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 Focal radiofrequency ablation of gastric antral vascular       ectasia in the gastric antrum was performed. With the endoscope in       place, the position and extent of the abnormal mucosa and appropriate       anatomic landmarks were noted. The radiofrequency channel ablation       catheter was introduced through the endoscope working channel. The       endoscope with the ablation catheter was advanced to the areas of       abnormal mucosa. The endoscope with the channel ablation catheter was       positioned under direct visualization so that the catheter was placed in       contact with the surface of the abnormal mucosa. Energy  was applied       twice at 12 J/cm2. Ablation was repeated in a likewise fashion to all       visible abnormal mucosa. The ablation catheter was removed through the       endoscope working channel. The areas where abnormal mucosa had been       ablated were examined. Areas of abnormal mucosa appeared completely       ablated. The endoscope was then  removed.      The examined duodenum was normal. Impression:               - LA Grade D esophagitis with no bleeding. Biopsied.                           - Hiatal hernia.                           - Erosive gastropathy with stigmata of recent                            bleeding.                           - Gastric antral vascular ectasia with overlying                            heme. Treated with radiofrequency ablation.                           - Normal examined duodenum. Moderate Sedation:      Not Applicable - Patient had care per Anesthesia. Recommendation:           - Discharge patient to home (with escort).                           - Use Prilosec (omeprazole) 40 mg PO BID for 8                            weeks.                           - Use sucralfate suspension 1 gram PO QID for 2                            weeks.                           - Repeat upper endoscopy in 2 months to check                            healing.                           - Await pathology results.                           - The findings and recommendations were discussed  with the patient. Procedure Code(s):        --- Professional ---                           [phone], 2, Esophagogastroduodenoscopy, flexible,                            transoral; with control of bleeding, any method                           43239, Esophagogastroduodenoscopy, flexible,                            transoral; with biopsy, single or multiple Diagnosis Code(s):        --- Professional ---                           K20.90, Esophagitis, unspecified without bleeding                           K92.2, Gastrointestinal hemorrhage, unspecified                           K31.819, Angiodysplasia of stomach and duodenum                            without bleeding                           D50.9, Iron deficiency anemia, unspecified                           R13.10, Dysphagia, unspecified CPT copyright 2019  American Medical Association. All rights reserved. The codes documented in this report are preliminary and upon coder review may  be revised to meet current compliance requirements. Sonny Masters "Christia Reading,  01/03/2021 10:15:20 AM Number of Addenda: 0

## 2021-01-04 ENCOUNTER — Encounter: Payer: Self-pay | Admitting: Pulmonary Disease

## 2021-01-04 NOTE — Telephone Encounter (Signed)
RA please advise on the results of the PFT.  thanks °

## 2021-01-04 NOTE — Telephone Encounter (Signed)
Called pt to ensure she has in fact read the My Chart message as indicated and to ensure she does not have any questions about the information she had been sent. Pt verbalized acceptance and understanding of her prep instructions as well as date/time/location of her procedure.

## 2021-01-05 DIAGNOSIS — Z299 Encounter for prophylactic measures, unspecified: Secondary | ICD-10-CM | POA: Diagnosis not present

## 2021-01-05 DIAGNOSIS — B3781 Candidal esophagitis: Secondary | ICD-10-CM | POA: Diagnosis not present

## 2021-01-05 DIAGNOSIS — E1165 Type 2 diabetes mellitus with hyperglycemia: Secondary | ICD-10-CM | POA: Diagnosis not present

## 2021-01-05 DIAGNOSIS — I272 Pulmonary hypertension, unspecified: Secondary | ICD-10-CM | POA: Diagnosis not present

## 2021-01-05 LAB — SURGICAL PATHOLOGY

## 2021-01-05 MED FILL — Heparin Sod (Porcine)-NaCl IV Soln 1000 Unit/500ML-0.9%: INTRAVENOUS | Qty: 1000 | Status: AC

## 2021-01-05 NOTE — Telephone Encounter (Signed)
Spoke with the pt and notified need for HRCT  She was agreeable to this and I have placed order

## 2021-01-05 NOTE — Progress Notes (Signed)
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 Please have her take fluconazole 400 mg QD for one dose, followed by 200 mg QD for 13 days. Thanks.

## 2021-01-06 ENCOUNTER — Other Ambulatory Visit: Payer: Self-pay

## 2021-01-06 DIAGNOSIS — B3781 Candidal esophagitis: Secondary | ICD-10-CM

## 2021-01-06 MED ORDER — FLUCONAZOLE 10 MG/ML PO SUSR: ORAL | 0 refills | Status: AC

## 2021-01-07 ENCOUNTER — Other Ambulatory Visit (HOSPITAL_COMMUNITY): Payer: Self-pay | Admitting: Internal Medicine

## 2021-01-07 ENCOUNTER — Telehealth: Payer: Self-pay | Admitting: Internal Medicine

## 2021-01-07 NOTE — Telephone Encounter (Signed)
Returned pt call. Advised she is welcome to call around to other pharmacies to determine if they may have Rx in stock. Will be happy to send to an alternate pharmacy. Verbalized acceptance and understanding.

## 2021-01-07 NOTE — Telephone Encounter (Signed)
Patient called and stated that the pharmacy will not have her Diflucan proscription until next Tuesday. Patient seeking advice if its okay if she waits until then or if she needs a different medication. Please advise.

## 2021-01-18 ENCOUNTER — Other Ambulatory Visit (HOSPITAL_COMMUNITY): Payer: Self-pay | Admitting: *Deleted

## 2021-01-18 MED ORDER — OPSUMIT 10 MG PO TABS: 10.0000 mg | ORAL_TABLET | Freq: Every day | ORAL | 11 refills | Status: AC

## 2021-01-19 ENCOUNTER — Other Ambulatory Visit (HOSPITAL_COMMUNITY): Payer: Self-pay | Admitting: Pharmacist

## 2021-01-24 ENCOUNTER — Telehealth (HOSPITAL_COMMUNITY): Payer: Self-pay | Admitting: Pharmacy Technician

## 2021-01-24 ENCOUNTER — Encounter (HOSPITAL_COMMUNITY): Payer: Self-pay | Admitting: Pharmacy Technician

## 2021-01-24 NOTE — Telephone Encounter (Signed)
Advanced Heart Failure Patient Advocate Encounter  The TAF grant is currently open for [place]. Called the patient several times and left messages. This is a first come first serve grant and would like to obtain it to help pay for Opsumit. Hopefully will hear back from patient before the grant closes.  Charlann Boxer, CPhT

## 2021-01-25 ENCOUNTER — Telehealth (HOSPITAL_COMMUNITY): Payer: Self-pay | Admitting: Pharmacy Technician

## 2021-01-25 ENCOUNTER — Other Ambulatory Visit (HOSPITAL_COMMUNITY): Payer: Self-pay

## 2021-01-25 NOTE — Telephone Encounter (Signed)
Advanced Heart Failure Patient Advocate Encounter  Patient needs new referral send to [facility] for Opsumit. That was faxed in today along with PA approval and TAF grant.  Advanced Heart Failure Patient Advocate Encounter  Prior Authorization for Opsumit has been submitted and approved.    PA# [id number] Effective dates: 01/16/21 through 01/15/22  The [place] (TAF grant)  Member Number [id number]  Group Number [id number] Coverage from date 01/17/2020 Coverage to date 01/15/2022 Claims [place] PCN: [id number] BIN: [id number] Processing: 08 Phone: [phone] Fax: [phone]

## 2021-01-26 ENCOUNTER — Encounter (HOSPITAL_COMMUNITY): Payer: Self-pay | Admitting: Internal Medicine

## 2021-01-27 ENCOUNTER — Other Ambulatory Visit (HOSPITAL_COMMUNITY): Payer: Self-pay | Admitting: *Deleted

## 2021-01-27 MED ORDER — SILDENAFIL CITRATE 20 MG PO TABS: 60.0000 mg | ORAL_TABLET | Freq: Three times a day (TID) | ORAL | 3 refills | Status: DC

## 2021-01-27 NOTE — Telephone Encounter (Signed)
Advanced Heart Failure Patient Advocate Encounter  Patient's grant will cover Sildenafil as well. Sent 90 day RX request to [employer], corporate) to send to [facility]. She will get both Opsumit and Sildenafil from [facility]. Sent patient mychart message with approval and additional information.   Charlann Boxer, CPhT

## 2021-01-28 DIAGNOSIS — E1165 Type 2 diabetes mellitus with hyperglycemia: Secondary | ICD-10-CM | POA: Diagnosis not present

## 2021-01-28 DIAGNOSIS — I700 Atherosclerosis of aorta: Secondary | ICD-10-CM | POA: Diagnosis not present

## 2021-01-28 DIAGNOSIS — R21 Rash and other nonspecific skin eruption: Secondary | ICD-10-CM | POA: Diagnosis not present

## 2021-01-28 DIAGNOSIS — Z299 Encounter for prophylactic measures, unspecified: Secondary | ICD-10-CM | POA: Diagnosis not present

## 2021-01-28 DIAGNOSIS — R69 Illness, unspecified: Secondary | ICD-10-CM | POA: Diagnosis not present

## 2021-02-07 NOTE — Telephone Encounter (Signed)
Pt seen and labs repeat 12/4

## 2021-02-16 ENCOUNTER — Other Ambulatory Visit: Payer: Self-pay

## 2021-02-16 ENCOUNTER — Ambulatory Visit (HOSPITAL_COMMUNITY)
Admission: RE | Admit: 2021-02-16 | Discharge: 2021-02-16 | Disposition: A | Discharge status: Home / Self Care | Payer: Medicare HMO | Source: Ambulatory Visit | Attending: Pulmonary Disease | Admitting: Pulmonary Disease

## 2021-02-16 DIAGNOSIS — I119 Hypertensive heart disease without heart failure: Secondary | ICD-10-CM | POA: Diagnosis not present

## 2021-02-16 DIAGNOSIS — I270 Primary pulmonary hypertension: Secondary | ICD-10-CM | POA: Diagnosis not present

## 2021-02-16 DIAGNOSIS — J90 Pleural effusion, not elsewhere classified: Secondary | ICD-10-CM | POA: Diagnosis not present

## 2021-02-16 DIAGNOSIS — Z8701 Personal history of pneumonia (recurrent): Secondary | ICD-10-CM | POA: Diagnosis not present

## 2021-02-16 DIAGNOSIS — I2510 Atherosclerotic heart disease of native coronary artery without angina pectoris: Secondary | ICD-10-CM | POA: Diagnosis not present

## 2021-02-21 DIAGNOSIS — Z713 Dietary counseling and surveillance: Secondary | ICD-10-CM | POA: Diagnosis not present

## 2021-02-21 DIAGNOSIS — Z6822 Body mass index (BMI) 22.0-22.9, adult: Secondary | ICD-10-CM | POA: Diagnosis not present

## 2021-02-21 DIAGNOSIS — R21 Rash and other nonspecific skin eruption: Secondary | ICD-10-CM | POA: Diagnosis not present

## 2021-02-21 DIAGNOSIS — B3781 Candidal esophagitis: Secondary | ICD-10-CM | POA: Diagnosis not present

## 2021-02-21 DIAGNOSIS — Z299 Encounter for prophylactic measures, unspecified: Secondary | ICD-10-CM | POA: Diagnosis not present

## 2021-02-24 ENCOUNTER — Encounter: Payer: Self-pay | Admitting: Pulmonary Disease

## 2021-02-24 ENCOUNTER — Other Ambulatory Visit: Payer: Self-pay

## 2021-02-24 ENCOUNTER — Ambulatory Visit: Payer: Medicare HMO | Admitting: Pulmonary Disease

## 2021-02-24 VITALS — BP 132/62 | HR 74 | Temp 98.7°F | Ht 64.0 in | Wt 126.1 lb

## 2021-02-24 DIAGNOSIS — R911 Solitary pulmonary nodule: Secondary | ICD-10-CM

## 2021-02-24 DIAGNOSIS — I270 Primary pulmonary hypertension: Secondary | ICD-10-CM | POA: Diagnosis not present

## 2021-02-24 DIAGNOSIS — J849 Interstitial pulmonary disease, unspecified: Secondary | ICD-10-CM | POA: Diagnosis not present

## 2021-02-24 DIAGNOSIS — L270 Generalized skin eruption due to drugs and medicaments taken internally: Secondary | ICD-10-CM | POA: Diagnosis not present

## 2021-02-24 NOTE — Assessment & Plan Note (Signed)
PFTs show moderate intraparenchymal restriction and HRCT suggest "alternative" diagnosis -favoring infectious or inflammatory etiology. She does not seem to have any overt infection, no reason to suspect MAI.  She does not seem to have any stigmata of collagen vascular disease and serologies negative  We will see if infiltrates evolve on follow-up CT in 3 months

## 2021-02-24 NOTE — Assessment & Plan Note (Signed)
Appears to be WHO group 1/2 , negative serology except for positive ANA Sildenafil has been stopped due to blurred vision. She now has a rash on Opsumit may need to be stopped but will certainly need alternative agent

## 2021-02-24 NOTE — Progress Notes (Signed)
Subjective:    Patient ID: Jacqueline Harris, female    DOB: 12-23-1945, 76 y.o.   MRN: [id number]  HPI  76 yo remote smoker for FU of WHO 1/2 pulmonary hypertension and mild ILD   PMH -  09/2019 covid pneumonia  11/2019 LLE DVT Fe def anemia >> fe infusions   Presented 03/2020 , had COVID infection 09/2019  She reports a trip to [place] 08/2019 where she had to rent a scooter because she could not walk much at all  Meds - did not tolerate macitentan On sildenafil 04/2020  Chief Complaint  Patient presents with   Follow-up    Coughing from sinus drainage.  Breathing about the same since last OV.   Accompanied by daughter today who corroborates history  01/25/2021 developed erythematous papular rash arms, thighs, chest, neck >> PCP gave steroid shot & abx , now on oral steroids Worsening vision >>Sildenafil stopped  She underwent right heart cath in December which showed severe PAH she was restarted on Opsumit and Aldactone  We reviewed PFTs and HRCT chest today and previous cardiology consultation She reports shortness of breath and increased bipedal edema Reviewed GI evaluation and EGD results were noted, she was treated with Diflucan for esophagitis  Significant tests/ events reviewed HRCT chest 02/2021 small - mod BL effusions , mild ILD "indeterminate" , mediastinal soft tissue mass 3.5 x 2.0 cm , nodular infiltrates both lower lobes Largest 1 x 1.7 cm left lower lobe  PFTs 12/2020 mod restriction TLC 67%, DLCO 50%  03/2020 VQ neg 11/2019 CTA chest >> No pulmonary embolism.  Moderate pericardial effusion with mild cardiomegaly.   Multifocal patchy airspace opacities   Echo 03/2020 RVSP 93, D shaped septum ,mod pericardial efusion   Serology 03/2020 ANA +, CCP neg, ANCA, scl neg   RHC 12/2020 Severe PAH with normal left-sided pressures and normal output  RA = 11 RV = 82/11 PA = 85/28 (48) PCW = 14 Fick cardiac output/index = 7.3/4.6 PVR = 4.6 WU Ao sat = 91% PA  sat = 68%, 69% PaPI = 6.5   RHC 04/20/20 >> Moderate PAH with normal left-sided pressures   RA = 4 RV = 67/4 PA = 68/22 (41) PCW = 3 Fick cardiac output/index = 7.3/4.4 Thermo CO/CI = 5.9/3.5 PVR = 5.2 (Fick) 6.5 (Thermo) Ao sat = 99% PA sat = 71%, 74% High SVC sat = 73%  Review of Systems neg for any significant sore throat, dysphagia, itching, sneezing, nasal congestion or excess/ purulent secretions, fever, chills, sweats, unintended wt loss, pleuritic or exertional cp, hempoptysis, orthopnea pnd or change in chronic leg swelling. Also denies presyncope, palpitations, heartburn, abdominal pain, nausea, vomiting, diarrhea or change in bowel or urinary habits, dysuria,hematuria, rash, arthralgias, visual complaints, headache, numbness weakness or ataxia.     Objective:   Physical Exam  Gen. Pleasant, well-nourished, in no distress, normal affect ENT - no pallor,icterus, no post nasal drip Neck: No JVD, no thyromegaly, no carotid bruits Lungs: no use of accessory muscles, no dullness to percussion, right basal dry crackles Cardiovascular: Rhythm regular, heart sounds  normal, no murmurs or gallops, no peripheral edema Abdomen: soft and non-tender, no hepatosplenomegaly, BS normal. Musculoskeletal: No deformities, no cyanosis or clubbing Neuro:  alert, non focal Skin -diffuse erythematous papular rash, some scaling over both legs, more erythema over thighs and trunk       Assessment & Plan:   Skin rash -not clear if she took Diflucan at all, this  would be the most likely culprit. Otherwise I have asked her to stop Aldactone first and if rash persists for another week then she would have to stop Opsumit She is going to see dermatology today and hopefully get a skin biopsy to further evaluate

## 2021-02-24 NOTE — Assessment & Plan Note (Signed)
Noted on HRCT. Also soft tissue mass in mid mediastinum. We will obtain CT chest with contrast in 3 months

## 2021-02-24 NOTE — Patient Instructions (Addendum)
X For the rash, STOP aldactone  If no better in 1 week , STOP opsumit  X High res CT chest in may 2023 - follow up nodule

## 2021-02-28 ENCOUNTER — Encounter: Payer: Self-pay | Admitting: Pulmonary Disease

## 2021-02-28 NOTE — Telephone Encounter (Signed)
Dr determined the rash was due to medicine so did not do a biopsy at this time. Prescription for Acetonide cream and Hydroxyzine 28m   FYI Dr. AElsworth Soho

## 2021-03-03 NOTE — Telephone Encounter (Signed)
Jacqueline Harris  P [facility] (supporting Rigoberto Noel, MD) 1 hour ago (11:59 AM)   No ma'am, finished GI meds 2 - 3 weeks after procedure    You  Jacqueline Harris and proxy Jacqueline Millin D Burkhart "[name]") 2 hours ago (11:57 AM)   Thank you so much for the update, [place]! I will let Dr. Elsworth Soho know.   Dr. Elsworth Soho wanted to confirm if you were taking Diflucan For esophagitis? He says this is another medication that can cause a rash.        Jacqueline Harris  P [facility] (supporting Rigoberto Noel, MD) 2 hours ago (11:31 AM)   Rash is much better. Itching was gone by Monday.  I can see the rash but it is fading away, not red like before. So guess was the Spironolacton.  Routing to Dr. Elsworth Soho as an Juluis Rainier.

## 2021-03-04 ENCOUNTER — Encounter (HOSPITAL_COMMUNITY): Payer: Self-pay | Admitting: Internal Medicine

## 2021-03-04 NOTE — Progress Notes (Signed)
Attempted to obtain medical history via telephone, unable to reach at this time. I left a voicemail to return pre surgical testing department's phone call.

## 2021-03-14 ENCOUNTER — Encounter (HOSPITAL_COMMUNITY)
Admission: RE | Disposition: A | Discharge status: Home / Self Care | Payer: Self-pay | Source: Ambulatory Visit | Attending: Internal Medicine

## 2021-03-14 ENCOUNTER — Ambulatory Visit (HOSPITAL_BASED_OUTPATIENT_CLINIC_OR_DEPARTMENT_OTHER): Payer: Medicare HMO | Admitting: Registered Nurse

## 2021-03-14 ENCOUNTER — Other Ambulatory Visit: Payer: Self-pay

## 2021-03-14 ENCOUNTER — Ambulatory Visit (HOSPITAL_COMMUNITY): Payer: Medicare HMO | Admitting: Registered Nurse

## 2021-03-14 ENCOUNTER — Ambulatory Visit (HOSPITAL_COMMUNITY)
Admission: RE | Admit: 2021-03-14 | Discharge: 2021-03-14 | Disposition: A | Discharge status: Home / Self Care | Payer: Medicare HMO | Source: Ambulatory Visit | Attending: Internal Medicine | Admitting: Internal Medicine

## 2021-03-14 DIAGNOSIS — B49 Unspecified mycosis: Secondary | ICD-10-CM | POA: Diagnosis not present

## 2021-03-14 DIAGNOSIS — K2100 Gastro-esophageal reflux disease with esophagitis, without bleeding: Secondary | ICD-10-CM

## 2021-03-14 DIAGNOSIS — K2289 Other specified disease of esophagus: Secondary | ICD-10-CM | POA: Diagnosis not present

## 2021-03-14 DIAGNOSIS — K449 Diaphragmatic hernia without obstruction or gangrene: Secondary | ICD-10-CM | POA: Insufficient documentation

## 2021-03-14 DIAGNOSIS — K2970 Gastritis, unspecified, without bleeding: Secondary | ICD-10-CM

## 2021-03-14 DIAGNOSIS — K31819 Angiodysplasia of stomach and duodenum without bleeding: Secondary | ICD-10-CM | POA: Insufficient documentation

## 2021-03-14 DIAGNOSIS — K269 Duodenal ulcer, unspecified as acute or chronic, without hemorrhage or perforation: Secondary | ICD-10-CM | POA: Diagnosis not present

## 2021-03-14 DIAGNOSIS — K3189 Other diseases of stomach and duodenum: Secondary | ICD-10-CM | POA: Diagnosis not present

## 2021-03-14 DIAGNOSIS — Z87891 Personal history of nicotine dependence: Secondary | ICD-10-CM | POA: Diagnosis not present

## 2021-03-14 DIAGNOSIS — R1011 Right upper quadrant pain: Secondary | ICD-10-CM

## 2021-03-14 DIAGNOSIS — R1310 Dysphagia, unspecified: Secondary | ICD-10-CM

## 2021-03-14 DIAGNOSIS — K2950 Unspecified chronic gastritis without bleeding: Secondary | ICD-10-CM | POA: Insufficient documentation

## 2021-03-14 DIAGNOSIS — K209 Esophagitis, unspecified without bleeding: Secondary | ICD-10-CM | POA: Diagnosis not present

## 2021-03-14 DIAGNOSIS — I509 Heart failure, unspecified: Secondary | ICD-10-CM | POA: Insufficient documentation

## 2021-03-14 DIAGNOSIS — I110 Hypertensive heart disease with heart failure: Secondary | ICD-10-CM | POA: Insufficient documentation

## 2021-03-14 DIAGNOSIS — D649 Anemia, unspecified: Secondary | ICD-10-CM | POA: Diagnosis not present

## 2021-03-14 HISTORY — PX: BIOPSY: SHX5522

## 2021-03-14 HISTORY — PX: HEMOSTASIS CLIP PLACEMENT: SHX6857

## 2021-03-14 HISTORY — PX: ESOPHAGOGASTRODUODENOSCOPY (EGD) WITH PROPOFOL: SHX5813

## 2021-03-14 SURGERY — ESOPHAGOGASTRODUODENOSCOPY (EGD) WITH PROPOFOL
Anesthesia: Monitor Anesthesia Care

## 2021-03-14 MED ORDER — ONDANSETRON HCL 4 MG/2ML IJ SOLN
INTRAMUSCULAR | Status: DC | PRN
  Administered 2021-03-14: 4 mg via INTRAVENOUS

## 2021-03-14 MED ORDER — SODIUM CHLORIDE 0.9 % IV SOLN: INTRAVENOUS | Status: DC

## 2021-03-14 MED ORDER — PROPOFOL 10 MG/ML IV BOLUS
INTRAVENOUS | Status: DC | PRN
  Administered 2021-03-14 (×2): 20 mg via INTRAVENOUS

## 2021-03-14 MED ORDER — METOCLOPRAMIDE HCL 5 MG/ML IJ SOLN
INTRAMUSCULAR | Status: DC | PRN
  Administered 2021-03-14: 10 mg via INTRAVENOUS

## 2021-03-14 MED ORDER — PROPOFOL 500 MG/50ML IV EMUL
INTRAVENOUS | Status: DC | PRN
  Administered 2021-03-14: 130 ug/kg/min via INTRAVENOUS

## 2021-03-14 MED ORDER — PROPOFOL 500 MG/50ML IV EMUL
INTRAVENOUS | Status: AC
  Filled 2021-03-14: qty 50

## 2021-03-14 SURGICAL SUPPLY — 15 items

## 2021-03-14 NOTE — Progress Notes (Signed)
Held pt in recovery for 1 hour,  per dr Lorenso Courier, to monitor pt due to some bleeding during procedure.  Pt did well in recovery.

## 2021-03-14 NOTE — Anesthesia Preprocedure Evaluation (Signed)
Anesthesia Evaluation  Patient identified by MRN, date of birth, ID band Patient awake    Reviewed: Allergy & Precautions, NPO status , Patient's Chart, lab work & pertinent test results  History of Anesthesia Complications Negative for: history of anesthetic complications  Airway Mallampati: I   Neck ROM: Full    Dental  (+) Missing,    Pulmonary former smoker,    Pulmonary exam normal        Cardiovascular hypertension, +CHF  Normal cardiovascular exam  TTE: EF 60-65%, mild LVH, grade I DD, RV volume overload, RV systolic function mildly reduced, RV mildly-to-moderately enlarged, severely elevated PASP 77.6 mmHg, moderate RAE, large circumferential pericardial effusion with largest diameter posterior to the LV measuring 2.3cm at end diastole, no evidence of cardiac tamponade, moderate TR   Neuro/Psych negative neurological ROS  negative psych ROS   GI/Hepatic Neg liver ROS,   Endo/Other  negative endocrine ROS  Renal/GU negative Renal ROS  negative genitourinary   Musculoskeletal  (+) Arthritis ,   Abdominal Normal abdominal exam  (+)   Peds  Hematology  (+) Blood dyscrasia, anemia , Hgb 9.2   Anesthesia Other Findings   Reproductive/Obstetrics negative OB ROS                             Anesthesia Physical  Anesthesia Plan  ASA: 3  Anesthesia Plan: MAC   Post-op Pain Management: Minimal or no pain anticipated   Induction: Intravenous  PONV Risk Score and Plan: 2 and Treatment may vary due to age or medical condition, Propofol infusion and TIVA  Airway Management Planned: Natural Airway and Mask  Additional Equipment: None  Intra-op Plan:   Post-operative Plan:   Informed Consent: I have reviewed the patients History and Physical, chart, labs and discussed the procedure including the risks, benefits and alternatives for the proposed anesthesia with the patient or  authorized representative who has indicated his/her understanding and acceptance.       Plan Discussed with: CRNA  Anesthesia Plan Comments:         Anesthesia Quick Evaluation

## 2021-03-14 NOTE — Progress Notes (Signed)
Pt states her normal pulse ox reading is in the upper 80's at home.  Pt does not use oxygen at home

## 2021-03-14 NOTE — Discharge Instructions (Signed)
YOU HAD AN ENDOSCOPIC PROCEDURE TODAY: Refer to the procedure report and other information in the discharge instructions given to you for any specific questions about what was found during the examination. If this information does not answer your questions, please call [place] office at [phone] to clarify.   YOU SHOULD EXPECT: Some feelings of bloating in the abdomen. Passage of more gas than usual. Walking can help get rid of the air that was put into your GI tract during the procedure and reduce the bloating. If you had a lower endoscopy (such as a colonoscopy or flexible sigmoidoscopy) you may notice spotting of blood in your stool or on the toilet paper. Some abdominal soreness may be present for a day or two, also.  DIET: Your first meal following the procedure should be a light meal and then it is ok to progress to your normal diet. A half-sandwich or bowl of soup is an example of a good first meal. Heavy or fried foods are harder to digest and may make you feel nauseous or bloated. Drink plenty of fluids but you should avoid alcoholic beverages for 24 hours. If you had a esophageal dilation, please see attached instructions for diet.    ACTIVITY: Your care partner should take you home directly after the procedure. You should plan to take it easy, moving slowly for the rest of the day. You can resume normal activity the day after the procedure however YOU SHOULD NOT DRIVE, use power tools, machinery or perform tasks that involve climbing or major physical exertion for 24 hours (because of the sedation medicines used during the test).   SYMPTOMS TO REPORT IMMEDIATELY: A gastroenterologist can be reached at any hour. Please call [phone]  for any of the following symptoms:  Following upper endoscopy (EGD, EUS, ERCP, esophageal dilation) Vomiting of blood or coffee ground material  New, significant abdominal pain  New, significant chest pain or pain under the shoulder blades  Painful or  persistently difficult swallowing  New shortness of breath  Black, tarry-looking or red, bloody stools  FOLLOW UP:  If any biopsies were taken you will be contacted by phone or by letter within the next 1-3 weeks. Call [phone]  if you have not heard about the biopsies in 3 weeks.  Please also call with any specific questions about appointments or follow up tests.

## 2021-03-14 NOTE — Anesthesia Postprocedure Evaluation (Signed)
Anesthesia Post Note  Patient: Jacqueline Harris  Procedure(s) Performed: ESOPHAGOGASTRODUODENOSCOPY (EGD) WITH PROPOFOL BIOPSY HEMOSTASIS CLIP PLACEMENT     Patient location during evaluation: Endoscopy Anesthesia Type: MAC Level of consciousness: awake Pain management: pain level controlled Vital Signs Assessment: post-procedure vital signs reviewed and stable Respiratory status: spontaneous breathing Cardiovascular status: stable Postop Assessment: no apparent nausea or vomiting Anesthetic complications: no   No notable events documented.  Last Vitals:  Vitals:   03/14/21 1110 03/14/21 1125  BP:    Pulse: 81 83  Resp: (!) 23 20  Temp:    SpO2: (!) 89% (!) 85%    Last Pain:  Vitals:   03/14/21 1056  TempSrc:   PainSc: 0-No pain                 Huston Foley

## 2021-03-14 NOTE — Transfer of Care (Signed)
Immediate Anesthesia Transfer of Care Note  Patient: Jacqueline Harris  Procedure(s) Performed: ESOPHAGOGASTRODUODENOSCOPY (EGD) WITH PROPOFOL BIOPSY HEMOSTASIS CLIP PLACEMENT  Patient Location: PACU  Anesthesia Type:MAC  Level of Consciousness: awake, alert  and oriented  Airway & Oxygen Therapy: Patient Spontanous Breathing and Patient connected to face mask oxygen  Post-op Assessment: Report given to RN, Post -op Vital signs reviewed and stable and Patient moving all extremities X 4  Post vital signs: Reviewed and stable  Last Vitals:  Vitals Value Taken Time  BP 103/39   Temp    Pulse 85 03/14/21 1041  Resp 20 03/14/21 1041  SpO2 98 % 03/14/21 1041  Vitals shown include unvalidated device data.  Last Pain:  Vitals:   03/14/21 0827  TempSrc: Temporal         Complications: No notable events documented.

## 2021-03-14 NOTE — H&P (Addendum)
GASTROENTEROLOGY PROCEDURE H&P NOTE   Primary Care Physician: Glenda Chroman, MD    Reason for Procedure:   Follow up of esophagitis  Plan:    EGD  Patient is appropriate for endoscopic procedure(s) in the hospital setting.  The nature of the procedure, as well as the risks, benefits, and alternatives were carefully and thoroughly reviewed with the patient. Ample time for discussion and questions allowed. The patient understood, was satisfied, and agreed to proceed.     HPI: Jacqueline Harris is a 76 y.o. female who presents for EGD for follow up of esophagitis.  Past Medical History:  Diagnosis Date   Acute deep vein thrombosis (DVT) of left peroneal vein [facility])    November 2021   Anemia    Arthritis    CHF (congestive heart failure) (HCC)    Colon polyps    Essential hypertension    GAVE (gastric antral vascular ectasia)    HLD (hyperlipidemia)    Pneumonia due to COVID-19 virus    November 2021   Prediabetes     Past Surgical History:  Procedure Laterality Date   BIOPSY  01/03/2021   Procedure: BIOPSY;  Surgeon: Sharyn Creamer, MD;  Location: [facility];  Service: Gastroenterology;;   CHOLECYSTECTOMY     COLONOSCOPY N/A 12/03/2014   Procedure: COLONOSCOPY;  Surgeon: Rogene Houston, MD;  [facility];  Service: Endoscopy;  Laterality: N/A;  830   COLONOSCOPY WITH PROPOFOL N/A 03/10/2020   Procedure: COLONOSCOPY WITH PROPOFOL;  Surgeon: Rogene Houston, MD;  [facility];  Service: Endoscopy;  Laterality: N/A;  1:15   ESOPHAGOGASTRODUODENOSCOPY (EGD) WITH PROPOFOL N/A 01/03/2021   Procedure: ESOPHAGOGASTRODUODENOSCOPY (EGD) WITH PROPOFOL;  Surgeon: Sharyn Creamer, MD;  Location: [facility];  Service: Gastroenterology;  Laterality: N/A;   POLYPECTOMY  03/10/2020   Procedure: POLYPECTOMY;  Surgeon: Rogene Houston, MD;  [facility];  Service: Endoscopy;;   RADIOFREQUENCY ABLATION  01/03/2021   Procedure: RADIO FREQUENCY ABLATION;   Surgeon: Sharyn Creamer, MD;  Location: [facility];  Service: Gastroenterology;;   RIGHT HEART CATH N/A 04/20/2020   Procedure: RIGHT HEART CATH;  Surgeon: Jolaine Artist, MD;  Location: [place] [facility];  Service: Cardiovascular;  Laterality: N/A;   RIGHT HEART CATH N/A 12/31/2020   Procedure: RIGHT HEART CATH;  Surgeon: Jolaine Artist, MD;  Location: [place] [facility];  Service: Cardiovascular;  Laterality: N/A;    Prior to Admission medications   Medication Sig Start Date End Date Taking? Authorizing Provider  acetaminophen (TYLENOL) 650 MG CR tablet Take 650 mg by mouth every 8 (eight) hours as needed for pain.   Yes [provider]  ALPRAZolam Duanne Moron) 0.5 MG tablet Take 0.5 mg by mouth at bedtime as needed for anxiety.   Yes [provider]  citalopram (CELEXA) 20 MG tablet Take 20 mg by mouth daily.   Yes [provider]  furosemide (LASIX) 20 MG tablet Take 20 mg by mouth daily.   Yes [provider]  hydrOXYzine (VISTARIL) 25 MG capsule Take 25 mg by mouth at bedtime as needed (sleep). 02/24/21  Yes [provider]  macitentan (OPSUMIT) 10 MG tablet Take 1 tablet (10 mg total) by mouth daily. 01/18/21  Yes Bensimhon, Shaune Pascal, MD  Multiple Vitamins-Minerals (MULTIVITAMIN WITH MINERALS) tablet Take 1 tablet by mouth daily.   Yes [provider]  potassium chloride (KLOR-CON) 10 MEQ tablet Take 10 mEq by mouth daily.  01/02/21  Yes [provider]    Current Facility-Administered Medications  Medication Dose Route Frequency Provider Last Rate Last Admin   0.9 %  sodium chloride infusion   Intravenous Continuous Sharyn Creamer, MD       0.9 %  sodium chloride infusion   Intravenous Continuous Sharyn Creamer, MD 10 mL/hr at 03/14/21 4584 Continued from Pre-op at 03/14/21 0943    Allergies as of 01/03/2021   (No Known Allergies)    Family History  Problem Relation Age of Onset   Cancer - Lung Mother    Bladder  Cancer Father    Diabetes Father    COPD Father    Hypertension Sister    Heart disease Sister    Other Sister        GAVE   Diabetes Sister    Cirrhosis Sister    Other Sister        GAVE   Cancer - Colon Brother    Diabetes Brother    Heart disease Brother    Thyroid disease Son    Depression Son    Anxiety disorder Son    Hyperlipidemia Son     Social History   Socioeconomic History   Marital status: Divorced    Spouse name: Not on file   Number of children: 2   Years of education: Not on file   Highest education level: Not on file  Occupational History   Occupation: retired  Tobacco Use   Smoking status: Former    Packs/day: 0.50    Years: 13.00    Pack years: 6.50    Types: Cigarettes    Quit date: 1985    Years since quitting: 38.1   Smokeless tobacco: Never  Vaping Use   Vaping Use: Never used  Substance and Sexual Activity   Alcohol use: No   Drug use: No   Sexual activity: Not on file  Other Topics Concern   Not on file  Social History Narrative   Not on file   Social Determinants of Health   Financial Resource Strain: Not on file  Food Insecurity: Not on file  Transportation Needs: Not on file  Physical Activity: Not on file  Stress: Not on file  Social Connections: Not on file  Intimate Partner Violence: Not on file    Physical Exam: Vital signs in last 24 hours: BP (!) 163/47    Pulse 96    Temp 98.2 F (36.8 C) (Temporal)    Resp (!) 25    Ht _0  (1.626 m)    Wt 54.9 kg    SpO2 93%    BMI 20.77 kg/m  GEN: NAD EYE: Sclerae anicteric ENT: MMM CV: Non-tachycardic Pulm: No increased work of breathing GI: Soft, NT/ND NEURO:  Alert & Oriented   Christia Reading, MD [facility]  03/14/2021 9:52 AM

## 2021-03-14 NOTE — Op Note (Addendum)
[facility] Patient Name: Jacqueline Harris Procedure Date: 03/14/2021 MRN: [id number] Attending MD: Georgian Co ,  Date of Birth: 12-10-1945 CSN: [id number] Age: 76 Admit Type: Inpatient Procedure:                Upper GI endoscopy Indications:              Follow-up of esophagitis Providers:                Sonny Masters "Lyndee Leo" Burnice Logan, Technician, Janie Billups, Technician,                            Courtney Heys. [place], CRNA Referring MD:             Glenda Chroman Medicines:                Monitored Anesthesia Care Complications:            No immediate complications. Estimated Blood Loss:     Estimated blood loss was minimal. Procedure:                Pre-Anesthesia Assessment:                           - Prior to the procedure, a History and Physical                            was performed, and patient medications and                            allergies were reviewed. The patient's tolerance of                            previous anesthesia was also reviewed. The risks                            and benefits of the procedure and the sedation                            options and risks were discussed with the patient.                            All questions were answered, and informed consent                            was obtained. Prior Anticoagulants: The patient has                            taken no previous anticoagulant or antiplatelet                            agents. ASA Grade Assessment: III - A patient with  severe systemic disease. After reviewing the risks                            and benefits, the patient was deemed in                            satisfactory condition to undergo the procedure.                           After obtaining informed consent, the endoscope was                            passed under direct vision. Throughout the                             procedure, the patient's blood pressure, pulse, and                            oxygen saturations were monitored continuously. The                            GIF-H190 ([id number]) Olympus endoscope was introduced                            through the mouth, and advanced to the second part                            of duodenum. The upper GI endoscopy was                            accomplished without difficulty. The patient                            tolerated the procedure well. Scope In: Scope Out: Findings:      White nummular lesions were noted in the entire esophagus. Biopsies were       taken with a cold forceps for histology. To prevent bleeding after the       biopsy, one hemostatic clip was successfully placed. There was no       bleeding at the end of the procedure.      A small hiatal hernia was present.      Localized mild inflammation characterized by congestion (edema),       erosions and erythema was found in the gastric body. Biopsies were taken       with a cold forceps for histology.      Gastric antral vascular ectasia without bleeding was present in the       gastric antrum.      Two non-bleeding cratered duodenal ulcers with no stigmata of bleeding       were found in the duodenal bulb. The largest lesion was 6 mm in largest       dimension. Biopsies were taken with a cold forceps for histology. Impression:               - White nummular lesions in esophageal mucosa.  Biopsied. Clip was placed.                           - Small hiatal hernia.                           - Gastritis. Biopsied.                           - Gastric antral vascular ectasia without bleeding.                           - Non-bleeding duodenal ulcers with no stigmata of                            bleeding. Biopsied. Moderate Sedation:      Not Applicable - Patient had care per Anesthesia. Recommendation:           - Discharge patient to home (with escort).                            - Your esophagitis and GAVE are much improved from                            prior. However, you have new duodenal ulcers that                            have developed.                           - Use Prilosec (omeprazole) 40 mg PO BID for 12                            weeks.                           - No ibuprofen, naproxen, or other non-steroidal                            anti-inflammatory drugs.                           - Await pathology results.                           - Return to GI clinic in 1 month.                           - The findings and recommendations were discussed                            with the patient. Procedure Code(s):        --- Professional ---                           [phone], Esophagogastroduodenoscopy, flexible,  transoral; with biopsy, single or multiple Diagnosis Code(s):        --- Professional ---                           K22.8, Other specified diseases of esophagus                           K44.9, Diaphragmatic hernia without obstruction or                            gangrene                           K29.70, Gastritis, unspecified, without bleeding                           K31.819, Angiodysplasia of stomach and duodenum                            without bleeding                           K26.9, Duodenal ulcer, unspecified as acute or                            chronic, without hemorrhage or perforation                           K20.90, Esophagitis, unspecified without bleeding CPT copyright 2019 American Medical Association. All rights reserved. The codes documented in this report are preliminary and upon coder review may  be revised to meet current compliance requirements. [name]" [place],  03/14/2021 10:46:17 AM Number of Addenda: 0

## 2021-03-15 ENCOUNTER — Encounter (HOSPITAL_COMMUNITY): Payer: Self-pay | Admitting: Internal Medicine

## 2021-03-16 LAB — SURGICAL PATHOLOGY

## 2021-03-18 ENCOUNTER — Other Ambulatory Visit: Payer: Self-pay

## 2021-03-18 ENCOUNTER — Encounter: Payer: Self-pay | Admitting: Internal Medicine

## 2021-03-18 DIAGNOSIS — B3781 Candidal esophagitis: Secondary | ICD-10-CM

## 2021-03-18 DIAGNOSIS — K2100 Gastro-esophageal reflux disease with esophagitis, without bleeding: Secondary | ICD-10-CM

## 2021-03-18 MED ORDER — OMEPRAZOLE 40 MG PO CPDR: 40.0000 mg | DELAYED_RELEASE_CAPSULE | Freq: Two times a day (BID) | ORAL | 0 refills | Status: AC

## 2021-03-28 ENCOUNTER — Telehealth: Payer: Self-pay

## 2021-03-28 ENCOUNTER — Encounter: Payer: Self-pay | Admitting: Pulmonary Disease

## 2021-03-28 ENCOUNTER — Other Ambulatory Visit: Payer: Self-pay

## 2021-03-28 ENCOUNTER — Ambulatory Visit (HOSPITAL_COMMUNITY)
Admission: RE | Admit: 2021-03-28 | Discharge: 2021-03-28 | Disposition: A | Discharge status: Home / Self Care | Payer: Medicare HMO | Source: Ambulatory Visit | Attending: Internal Medicine | Admitting: Internal Medicine

## 2021-03-28 VITALS — BP 146/54 | HR 95 | Ht 64.0 in | Wt 124.6 lb

## 2021-03-28 DIAGNOSIS — I5032 Chronic diastolic (congestive) heart failure: Secondary | ICD-10-CM | POA: Diagnosis not present

## 2021-03-28 DIAGNOSIS — R9431 Abnormal electrocardiogram [ECG] [EKG]: Secondary | ICD-10-CM | POA: Diagnosis not present

## 2021-03-28 DIAGNOSIS — Z7901 Long term (current) use of anticoagulants: Secondary | ICD-10-CM | POA: Insufficient documentation

## 2021-03-28 DIAGNOSIS — I50810 Right heart failure, unspecified: Secondary | ICD-10-CM

## 2021-03-28 DIAGNOSIS — Z8701 Personal history of pneumonia (recurrent): Secondary | ICD-10-CM | POA: Insufficient documentation

## 2021-03-28 DIAGNOSIS — I3139 Other pericardial effusion (noninflammatory): Secondary | ICD-10-CM | POA: Diagnosis not present

## 2021-03-28 DIAGNOSIS — Q2739 Arteriovenous malformation, other site: Secondary | ICD-10-CM | POA: Diagnosis not present

## 2021-03-28 DIAGNOSIS — I6523 Occlusion and stenosis of bilateral carotid arteries: Secondary | ICD-10-CM | POA: Insufficient documentation

## 2021-03-28 DIAGNOSIS — I270 Primary pulmonary hypertension: Secondary | ICD-10-CM | POA: Diagnosis not present

## 2021-03-28 DIAGNOSIS — I491 Atrial premature depolarization: Secondary | ICD-10-CM | POA: Insufficient documentation

## 2021-03-28 DIAGNOSIS — F17210 Nicotine dependence, cigarettes, uncomplicated: Secondary | ICD-10-CM | POA: Diagnosis not present

## 2021-03-28 DIAGNOSIS — I7300 Raynaud's syndrome without gangrene: Secondary | ICD-10-CM | POA: Insufficient documentation

## 2021-03-28 DIAGNOSIS — I110 Hypertensive heart disease with heart failure: Secondary | ICD-10-CM | POA: Insufficient documentation

## 2021-03-28 DIAGNOSIS — Z8616 Personal history of COVID-19: Secondary | ICD-10-CM | POA: Insufficient documentation

## 2021-03-28 DIAGNOSIS — I2721 Secondary pulmonary arterial hypertension: Secondary | ICD-10-CM | POA: Diagnosis not present

## 2021-03-28 DIAGNOSIS — D509 Iron deficiency anemia, unspecified: Secondary | ICD-10-CM | POA: Diagnosis not present

## 2021-03-28 DIAGNOSIS — Z86718 Personal history of other venous thrombosis and embolism: Secondary | ICD-10-CM | POA: Insufficient documentation

## 2021-03-28 DIAGNOSIS — Z79899 Other long term (current) drug therapy: Secondary | ICD-10-CM | POA: Insufficient documentation

## 2021-03-28 DIAGNOSIS — K208 Other esophagitis without bleeding: Secondary | ICD-10-CM | POA: Diagnosis not present

## 2021-03-28 MED ORDER — SILDENAFIL CITRATE 20 MG PO TABS: 20.0000 mg | ORAL_TABLET | Freq: Three times a day (TID) | ORAL | 11 refills | Status: AC

## 2021-03-28 NOTE — Patient Instructions (Addendum)
Double your lasix for the next 3 days (40 mg  daily for the next 3 days) ? ?Start sildenafil 20 mg Three times a day ? ?Your physician has requested that you have an echocardiogram. Echocardiography is a painless test that uses sound waves to create images of your heart. It provides your doctor with information about the size and shape of your heart and how well your heart?s chambers and valves are working. This procedure takes approximately one hour. There are no restrictions for this procedure. ? ? ?Your physician recommends that you schedule a follow-up appointment in: 3 months ? ?If you have any questions or concerns before your next appointment please send [place] a message through [place] or call our office at [phone].   ? ?TO LEAVE A MESSAGE FOR THE NURSE SELECT OPTION 2, PLEASE LEAVE A MESSAGE INCLUDING: ?YOUR NAME ?DATE OF BIRTH ?CALL BACK NUMBER ?REASON FOR CALL**this is important as we prioritize the call backs ? ?YOU WILL RECEIVE A CALL BACK THE SAME DAY AS LONG AS YOU CALL BEFORE 4:00 PM ? ?At the [facility], you and your health needs are our priority. As part of our continuing mission to provide you with exceptional heart care, we have created designated Provider Care Teams. These Care Teams include your primary Cardiologist (physician) and Advanced Practice Providers (APPs- Physician Assistants and Nurse Practitioners) who all work together to provide you with the care you need, when you need it.  ? ?You may see any of the following providers on your designated Care Team at your next follow up: ?Dr Glori Bickers ?Dr Loralie Champagne ?Darrick Grinder, NP ?Lyda Jester, PA ?Jessica Milford,NP ?Marlyce Huge, PA ?Audry Riles, PharmD ? ? ?Please be sure to bring in all your medications bottles to every appointment.  ? ? ?

## 2021-03-28 NOTE — Telephone Encounter (Signed)
From mychart message:  ?This message is being sent by Budd Palmer on behalf of Jacqueline Harris. ?  ?Mom and I are currently at her follow up appointment with Dr. Haroldine Laws and her sat is 88% on room air.  He wants her set up for home oxygen asap.  She said it has been as low as 83% over the past two weeks.  I am currently on hold with your office right now. ? ?Called and spoke to [place] and patient. Explained to them that per my team lead and because of insurance that the patient would need an OV discussing oxygen need and walk for O2. She voiced understanding and was willing to take an appt in [place] with Roxan Diesel on 3/22 at 11:00 for O2 needs.  ? ?Nothing further needed ?

## 2021-03-28 NOTE — Progress Notes (Signed)
ReDS Vest / Clip - 03/28/21 1500   ? ?  ? ReDS Vest / Clip  ? Station Marker A   ? Ruler Value 27   ? ReDS Value Range Low volume   ? ReDS Actual Value 34   ? ?  ?  ? ?  ? ? ?

## 2021-03-28 NOTE — Progress Notes (Signed)
? ?ADVANCED HF CLINIC NOTE ? ?Referring Physician:Samuel Domenic Polite, MD ?Primary Care: Glenda Chroman, MD ?Primary Cardiologist: Rozann Lesches, MD ? ? ?HPI: ? ?Jacqueline Harris is a 76 y.o. female with h/o HTN, tobacco use  referred by Dr. Domenic Polite for further evaluation of pulmonary HTN.  ? ?History includes COVID-19 in 9/21 (unvaccinated) than admitted to [facility] in 11/21 with PNA. Cardiology was involved in her care at that time with finding of a small pericardial effusion, also moderate RV dysfunction with severely elevated estimated RVSP of 68 mmHg.LVEF was normal.  It was suspected that this was related to acute pulmonary process at that point.  She also had an acute left peroneal vein DVT, treated with Eliquis.  Chest CTA at that time showed no pulmonary embolus. ? ?She saw Estella Husk in f/u. Echo 03/31/20 EF 60-65% Mildly dilated RV with mild RV dysfunction. Moderate TR. RVSP 59mHG. Moderate pericardial effusion  ? ?Had carotid u/s with Dr. VWoody Sellerbilateral 40-46% stenosis  ? ?Echo 12/15/20 EF 60-65% Mild LVH G1DD. D-shaped septum RV dilated. Mild to moderately HK. RVSP 735mG. Large pericardial effusion (no tamponade) ? 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 No personal or family h/o CTD. Does have Raynaud's.  ? ?RHDefiancen 4/22 ?RA = 4 ?RV = 67/4 ?PA = 68/22 (41) ?PCW = 3 ?Fick cardiac output/index = 7.3/4.4 ?Thermo CO/CI = 5.9/3.5 ?PVR = 5.2 (Fick) 6.5 (Thermo) ?Ao sat = 99% ?PA sat = 71%, 74% ?High SVC sat = 73% ?  ?Selective lower lobe R & L pulmonary angios: Mild distal pruning no evidence of clot ? ?Has been on sildenafil 20 tid. In 9/22 started on macitentan but stopped after two weeks due to bloating and worsening SOB. Also being treated for acute Fe-def anemia at the time. Has been following with GI (Dr. DoLorenso Courier  ? ?EGD 12/22: showed fungal esophagitis and gastric AVMs. ? ?RHPost Oak [place]2/22  ?RA = 11 ?RV = 82/11 ?PA = 85/28 (48) ?PCW = 14 ?Fick cardiac  output/index = 7.3/4.6 ?PVR = 4.6 WU ?Ao sat = 91% ?PA sat = 68%, 69% ?PaPI = 6.5 ? ?PFTs 12/22 ?FEV 1.18 (54%) ?FVC 1.50 (51%) ?DLCO 45% ? ?Here for f/u with her daughter. Says since EGD sats have dropped from 91% -> 83%. In 12/22 we increased sildenafil to 60 tid. After RHC we added back macitentan. She is tolerating that but stopped sildenafil due to blurry vision which got better after 2-3 weeks of stopping sildenafil. More SOB over past 2 weeks. + ankle edema. Weight up 3 pounds. Takes lasix 20 daily.  ? ? ? ?Past Medical History:  ?Diagnosis Date  ? Acute deep vein thrombosis (DVT) of left peroneal vein (HCC)   ? November 2021  ? Anemia   ? Arthritis   ? CH[place] (congestive heart failure) ([place] [place]  ? Colon polyps   ? Essential hypertension   ? GAVE (gastric antral vascular ectasia)   ? HLD (hyperlipidemia)   ? Pneumonia due to COVID-19 virus   ? November 2021  ? Prediabetes   ? ? ?Current Outpatient Medications  ?Medication Sig Dispense Refill  ? acetaminophen (TYLENOL) 650 MG CR tablet Take 650 mg by mouth every 8 (eight) hours as needed for pain.    ? ALPRAZolam (XANAX) 0.5 MG tablet Take 0.5 mg by mouth at bedtime as needed for anxiety.    ? citalopram (CELEXA) 20 MG tablet Take 20 mg by mouth  daily.    ? furosemide (LASIX) 20 MG tablet Take 20 mg by mouth daily.    ? hydrOXYzine (VISTARIL) 25 MG capsule Take 25 mg by mouth at bedtime as needed (sleep).    ? macitentan (OPSUMIT) 10 MG tablet Take 1 tablet (10 mg total) by mouth daily. 30 tablet 11  ? Multiple Vitamins-Minerals (MULTIVITAMIN WITH MINERALS) tablet Take 1 tablet by mouth daily.    ? omeprazole (PRILOSEC) 40 MG capsule Take 1 capsule (40 mg total) by mouth 2 (two) times daily. 168 capsule 0  ? potassium chloride (KLOR-CON) 10 MEQ tablet Take 10 mEq by mouth daily.    ? ?No current facility-administered medications for this encounter.  ? ? ?Allergies  ?Allergen Reactions  ? Spironolactone Rash  ? ? ?  ?Social History  ? ?Socioeconomic History  ?  Marital status: Divorced  ?  Spouse name: Not on file  ? Number of children: 2  ? Years of education: Not on file  ? Highest education level: Not on file  ?Occupational History  ? Occupation: retired  ?Tobacco Use  ? Smoking status: Former  ?  Packs/day: 0.50  ?  Years: 13.00  ?  Pack years: 6.50  ?  Types: Cigarettes  ?  Quit date: 50  ?  Years since quitting: 38.2  ? Smokeless tobacco: Never  ?Vaping Use  ? Vaping Use: Never used  ?Substance and Sexual Activity  ? Alcohol use: No  ? Drug use: No  ? Sexual activity: Not on file  ?Other Topics Concern  ? Not on file  ?Social History Narrative  ? Not on file  ? ?Social Determinants of Health  ? ?Financial Resource Strain: Not on file  ?Food Insecurity: Not on file  ?Transportation Needs: Not on file  ?Physical Activity: Not on file  ?Stress: Not on file  ?Social Connections: Not on file  ?Intimate Partner Violence: Not on file  ? ? ?  ?Family History  ?Problem Relation Age of Onset  ? Cancer - Lung Mother   ? Bladder Cancer Father   ? Diabetes Father   ? COPD Father   ? Hypertension Sister   ? Heart disease Sister   ? Other Sister   ?     GAVE  ? Diabetes Sister   ? Cirrhosis Sister   ? Other Sister   ?     GAVE  ? Cancer - Colon Brother   ? Diabetes Brother   ? Heart disease Brother   ? Thyroid disease Son   ? Depression Son   ? Anxiety disorder Son   ? Hyperlipidemia Son   ? ? ?Vitals:  ? 03/28/21 1448  ?BP: (!) 146/54  ?Pulse: 95  ?SpO2: (!) 88%  ?Weight: 56.5 kg (124 lb 9.6 oz)  ?Height: _0  (1.626 m)  ? ? ? ?PHYSICAL EXAM: ?General:  Weak appearing. On O2 No resp difficulty ?HEENT: normal ?Neck: supple. JVP 9-10 . Carotids 2+ bilat; no bruits. No lymphadenopathy or thryomegaly appreciated. ?Cor: PMI nondisplaced. Irregular rate & rhythm. 2/6 TR ?Lungs: Decreased throughout ?Abdomen: soft, nontender, nondistended. No hepatosplenomegaly. No bruits or masses. Good bowel sounds. ?Extremities: no cyanosis, clubbing, rash, 1+ edema ?Neuro: alert & orientedx3,  cranial nerves grossly intact. moves all 4 extremities w/o difficulty. Affect pleasant ? ? ?ASSESSMENT & PLAN: ? ?1. PAH ?- Echo 03/31/20 EF 60-65% Mildly dilated RV with mild RV dysfunction. Moderate TR. RVSP 77mHG. Moderate pericardial effusion  ?- hx of acute left peroneal vein  DVT although no pulmonary embolus by chest CTA in November 2021.  ?- VQ scan negative for CTEPH ?- CTD serology negative. She has telegectasias on her hands. Will repeat CTD labs ?- RHC 04/20/20 PA 68/22 (41), moderate PAH w/normal wedge of 3.  Selective lower lobe R and L pulmonary angio mild distal pruning but no clot ?- RHC 12/22 RA 11 PA 85/28 (48) PCW = 14 Fick 7.3/4.6 PVR = 4.6 WU ?- Echo 12/15/20 EF 60-65% Mild LVH G1DD. D-shaped septum RV dilated. Mild to moderately HK. RVSP 56mHG. Large pericardial effusion (no tamponade) ?- PFTs 12/22 FEV 1.18 (54%) FVC 1.50 (51%) DLCO 45% ?- WHO group 1 & 3 started sildenafil 20 TID, macitentan was added in 9/21 but stopped after 2 weeks due to volume overload and Fe-deficient anemia  ?- Macitentan re-added in 12/22 ?- She remains very tenuous. Now with worsening oxygenation ?- Will double lasix x 3 days. Once diuresed add back sildenafil 20 tid ?- Refer back to Dr. AElsworth Sohofor home O2 initiation ?- Repeat echo  ?- Labs today ? ?2. Pericardial effusion ?- moderate by echo 3/22 ?- large by echo 11/22. No tamponade. ?- likely due to elevated coronary sinus/RA pressures in setting of PAH ?- repeat echo  ? ?3. Chronic Diastolic CHF ?- volume status elevated. ?- Double lasix x 3 days ?- Consider SGLT2i  ? ?4. Microcytic Anemia, Iron Deficiency Anemia ?- 02/2020 colonoscopy benign with single polyp, no EGD since 2012 ?- unlikely that macitentan contributing to her anemia but this has been discontinued as well ?- has scheduled EGD on 01/03/21 with Dr. DLorenso Courierwill perform RHC prior to help furtter risk stratify ?- check iron stores ? ?5. Carotid stenosis ?- Had carotid u/s with Dr. VWoody Sellerbilateral 40-60%  stenosis  ?- doesn't tolerate statins ?- Dr. VWoody Sellerfollowing  ? ?Total time spent 45 minutes. Over half that time spent discussing above.  ? ? ?DGlori Bickers MD  ?3:13 PM ? ? ? ?

## 2021-03-30 ENCOUNTER — Other Ambulatory Visit: Payer: Self-pay

## 2021-03-30 ENCOUNTER — Ambulatory Visit: Payer: Medicare HMO | Admitting: Internal Medicine

## 2021-03-30 ENCOUNTER — Encounter: Payer: Self-pay | Admitting: Internal Medicine

## 2021-03-30 ENCOUNTER — Other Ambulatory Visit: Payer: Self-pay | Admitting: Physician Assistant

## 2021-03-30 VITALS — BP 137/64 | HR 103 | Temp 98.1°F | Wt 124.0 lb

## 2021-03-30 DIAGNOSIS — B3781 Candidal esophagitis: Secondary | ICD-10-CM

## 2021-03-30 MED ORDER — FLUCONAZOLE 200 MG PO TABS: 400.0000 mg | ORAL_TABLET | Freq: Every day | ORAL | 0 refills | Status: AC

## 2021-03-30 NOTE — Progress Notes (Signed)
?  ? ? ? ? ?[facility] ? ?Reason for Consult:recurrent candida esophagitis ?Referring Provider: Lorenso Courier ? ? ? ?Patient Active Problem List  ? Diagnosis Date Noted  ? ILD (interstitial lung disease) ([place]) 02/24/2021  ? Pulmonary nodule 02/24/2021  ? Acute esophagitis   ? GAVE (gastric antral vascular ectasia)   ? Dysphagia   ? Primary pulmonary hypertension ([place]) 04/29/2020  ? Malnutrition of moderate degree 11/25/2019  ? Coronary artery calcification   ? RVF (right ventricular failure) ([place])   ? Community acquired pneumonia 11/21/2019  ? Pericardial effusion 11/21/2019  ? Iron deficiency anemia 11/21/2019  ? Prediabetes   ? ? ? ? ?HPI: Jacqueline Harris is a 76 y.o. female pulmonary hypertension due to interstial lung disease, iron deficieny anemia, referred here for recurrent candida ? ?Patient had pneumonia admission 3 years ago. W/u with chest imaging/echo showed pulmonary hypertension. She ended up having a right heart cath with pulmonary hypertension. She has been seeing pulmonology. Unclear caused but she was told to have some kind of insterstitial lung disease. She takes sildenafil for this. She'll be getting on oxygen supplement soon ? ?For her iron deficiency, she had routine diagnostic egd 12/2020 which saw lesions c/w candida infection confirmed on biopsy. She was given 2 weeks of fluconazole 200 mg daily. She was then given 2 weeks of prednisone after breaking out from taking spironolactone. She had a follow up EGD done 2/27 that still sees candida (based on biopsy) lesions but improved in numbers ? ?Patient never had any pain with swallowing or chest discomfort with the candida infection ? ?She is not using any inhaled steroid chronically. Outside of 2 weeks of prednisone in 01/2021 is not on any immunosuppressant. Again the ILD is of unclear etiology ? ?Patient has no prior cancer. Her previous age related cancer screenings were negative ? ?Patient doesn't have any autoimmune  polyglandular disease. ? ?Patient does smoke/drink. Had hx smoking years ago ? ?No rash ?No joint pain ?No thyroid/bone-parathyroid issue ? ?Prediabetic on chart ? ? ?Fam hx: ?Brother died of colon cancer ?Sister died of cirrhos ?Father with lung cancer ? ? ? ?Review of Systems: ?ROS ?All other ros negative ? ? ? ? ? ?Past Medical History:  ?Diagnosis Date  ? Acute deep vein thrombosis (DVT) of left peroneal vein (HCC)   ? November 2021  ? Anemia   ? Arthritis   ? CHF (congestive heart failure) ([place])   ? Colon polyps   ? Essential hypertension   ? GAVE (gastric antral vascular ectasia)   ? HLD (hyperlipidemia)   ? Pneumonia due to COVID-19 virus   ? November 2021  ? Prediabetes   ? ? ?Social History  ? ?Tobacco Use  ? Smoking status: Former  ?  Packs/day: 0.50  ?  Years: 13.00  ?  Pack years: 6.50  ?  Types: Cigarettes  ?  Quit date: 15  ?  Years since quitting: 38.2  ? Smokeless tobacco: Never  ?Vaping Use  ? Vaping Use: Never used  ?Substance Use Topics  ? Alcohol use: No  ? Drug use: No  ? ? ?Family History  ?Problem Relation Age of Onset  ? Cancer - Lung Mother   ? Bladder Cancer Father   ? Diabetes Father   ? COPD Father   ? Hypertension Sister   ? Heart disease Sister   ? Other Sister   ?     GAVE  ? Diabetes Sister   ?  Cirrhosis Sister   ? Other Sister   ?     GAVE  ? Cancer - Colon Brother   ? Diabetes Brother   ? Heart disease Brother   ? Thyroid disease Son   ? Depression Son   ? Anxiety disorder Son   ? Hyperlipidemia Son   ? ? ?Allergies  ?Allergen Reactions  ? Spironolactone Rash  ? ? ?OBJECTIVE: ?Vitals:  ? 03/30/21 1506  ?BP: 137/64  ?Pulse: (!) 103  ?Temp: 98.1 ?F (36.7 ?C)  ?TempSrc: Temporal  ?Weight: 124 lb (56.2 kg)  ? ?Body mass index is 21.28 kg/m?. ? ? ?Physical Exam ?General/constitutional: no distress, pleasant ?HEENT: Normocephalic, PER, Conj Clear, EOMI, Oropharynx clear ?Neck supple ?CV: rrr no mrg ?Lungs: clear to auscultation, normal respiratory effort ?Abd: Soft, Nontender ?Ext: no  edema ?Skin: No Rash ?Neuro: nonfocal ?MSK: no peripheral joint swelling/tenderness/warmth; back spines nontender ? ? ?Lab: ?Lab Results  ?Component Value Date  ? WBC 4.2 12/27/2020  ? HGB 9.2 (L) 12/31/2020  ? HCT 27.0 (L) 12/31/2020  ? MCV 71.5 (L) 12/27/2020  ? PLT 334 12/27/2020  ? ?Last metabolic panel ?Lab Results  ?Component Value Date  ? GLUCOSE 120 (H) 12/31/2020  ? NA 143 12/31/2020  ? K 3.5 12/31/2020  ? CL 103 12/31/2020  ? CO2 25 12/31/2020  ? BUN 15 12/31/2020  ? CREATININE 0.98 12/31/2020  ? GFRNONAA >60 12/31/2020  ? CALCIUM 8.7 (L) 12/31/2020  ? PROT 7.0 12/27/2020  ? ALBUMIN 2.6 (L) 12/27/2020  ? BILITOT 0.6 12/27/2020  ? ALKPHOS 54 12/27/2020  ? AST 15 12/27/2020  ? ALT 7 12/27/2020  ? ANIONGAP 4 (L) 12/31/2020  ? ? ?Microbiology: ? ?Serology: ? ?Imaging: ? ? ?Assessment/plan: ?Problem List Items Addressed This Visit   ?None ?Visit Diagnoses   ? ? Candida esophagitis ([place])    -  Primary  ? Relevant Medications  ? fluconazole (DIFLUCAN) 200 MG tablet  ? ?  ? ?We discussed risk factors, sign, symptoms for candida esophagitis ?It is rather interesting she doesn't have any clinical symptoms ?I suspect the persistent of candida on her egd in 02/2021 was due to 2 weeks systemic prednisone that she has ? ?She has no risk for resistant candida infection otherwise ? ? ?She doesn't have s/s suggestive of associate with chronic mucocutaneous candidiasis syndrome. She has no risk factor for hiv and didn't want to do hiv testing. She is rather at advance age to think of some other primary/acquired immunodeficiency syndrome.  ? ? ?If this is recurrent again for some reason, will start with testing with hiv screen/cd4 count. ? ? ?-f/u as needed ?-fluconazole 400 mg po daily x 2weeks ? ? ? ? ? ?Follow-up: Return if symptoms worsen or fail to improve. ? ?Jabier Mutton, MD ?[facility] ?[place] ?[phone] pager   [phone] cell ?03/30/2021, 3:06 PM ? ?

## 2021-03-30 NOTE — Progress Notes (Signed)
? ?

## 2021-03-30 NOTE — Patient Instructions (Signed)
Candida infection is often found in malnutrition/severely immunodeficient patients (such as aids, cancer therapy, or trasplant, or rarely autoimmune condition, or chronic prednisone use) ? ?I do appreciate any risk factors that you have. Another surprising things is you don't have any symptoms ? ?I suspect the second time (02/2021) when you had egd and lesions were still around was because of the prednisone that you took ? ? ? ?Let's do 2 more weeks of fluconazole but at higher dose 400 mg once a day (or 200 mg twice a day) ? ? ?I do not see need to repeat EGD to confirm cure unless you have symptoms of burning/pain when you swallow, or if the egd is needed for something else ? ? ?There is no need for any blood test at this time unless you request an hiv screen. ? ? ?

## 2021-03-31 NOTE — Telephone Encounter (Signed)
This is a CHF pt 

## 2021-04-02 IMAGING — CT CT ANGIO CHEST
3 of 10 series · 17 of 36 positions shown · IV contrast (omnipaque)
Comparison: None.

CLINICAL DATA: Shortness of breath

EXAM:
CT ANGIOGRAPHY CHEST WITH CONTRAST
TECHNIQUE: Multidetector CT imaging of the chest was performed using the
standard protocol during bolus administration of intravenous
contrast. Multiplanar CT image reconstructions and MIPs were
obtained to evaluate the vascular anatomy.
CONTRAST:  74mL OMNIPAQUE IOHEXOL 350 MG/ML SOLN

[Series 6: pe thins · axial · 0.79mm/px · z∈[+757,+1001]mm · 14 of 282 slices shown]
[im 19/282  lung]
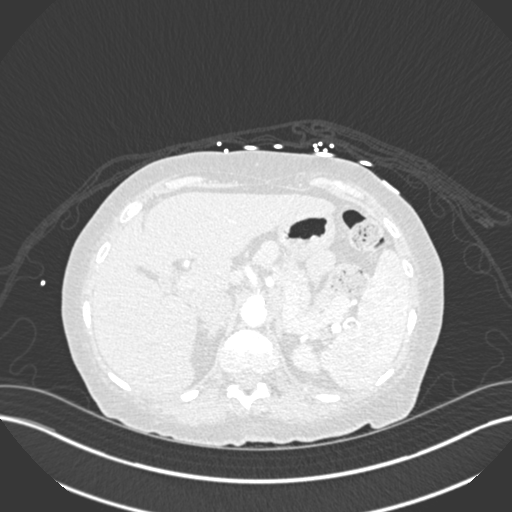
[im 38/282  mediastinal]
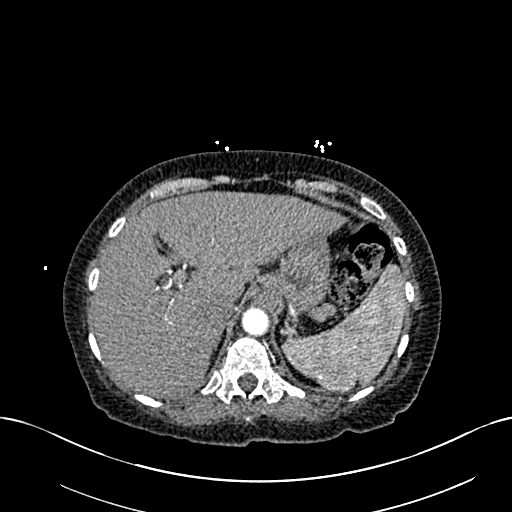
[im 57/282  lung]
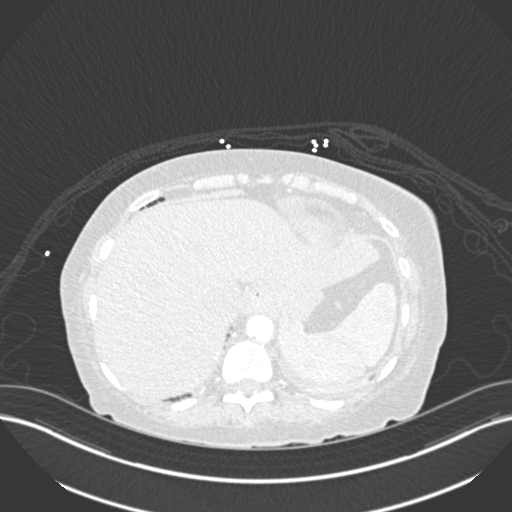
[im 75/282  mediastinal]
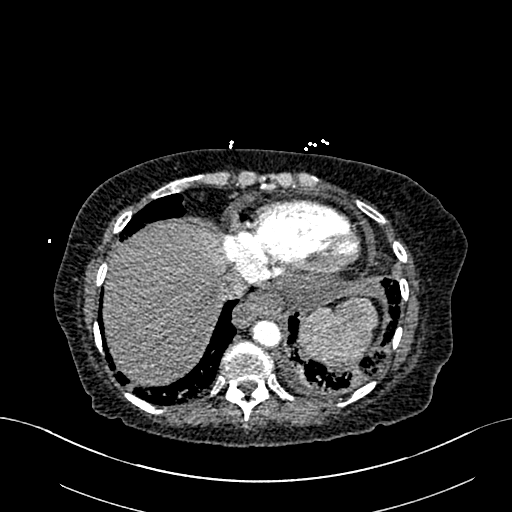
[im 94/282  lung]
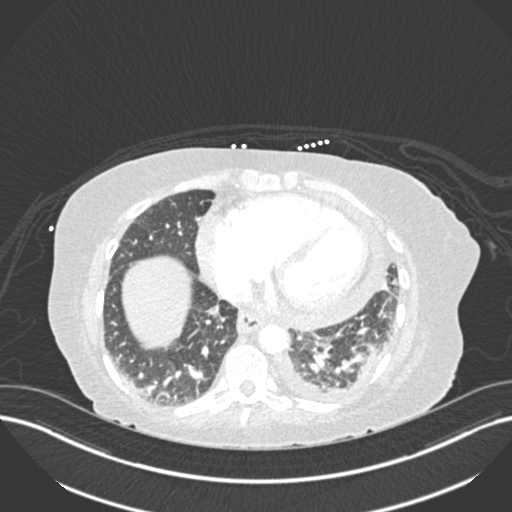
[im 113/282  mediastinal]
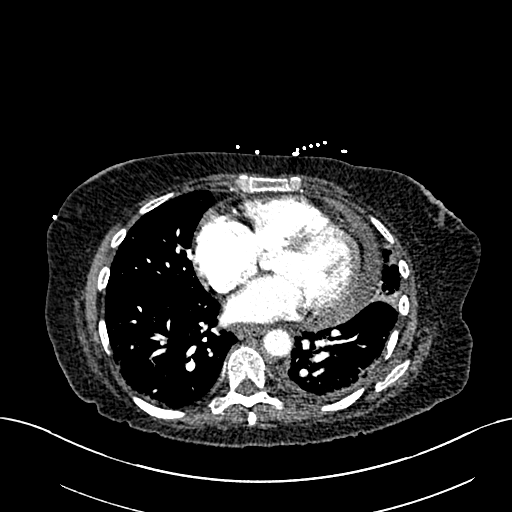
[im 132/282  lung]
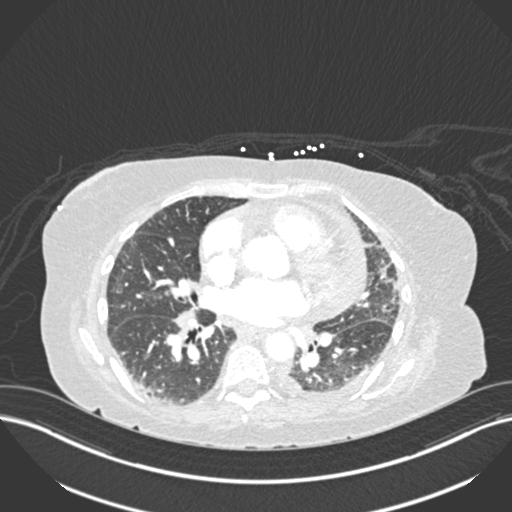
[im 150/282  mediastinal]
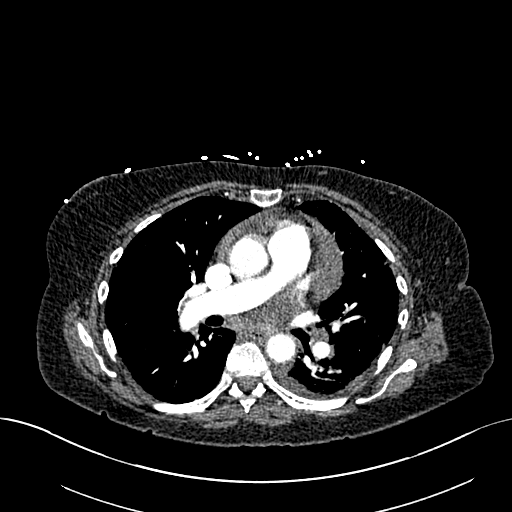
[im 169/282  lung]
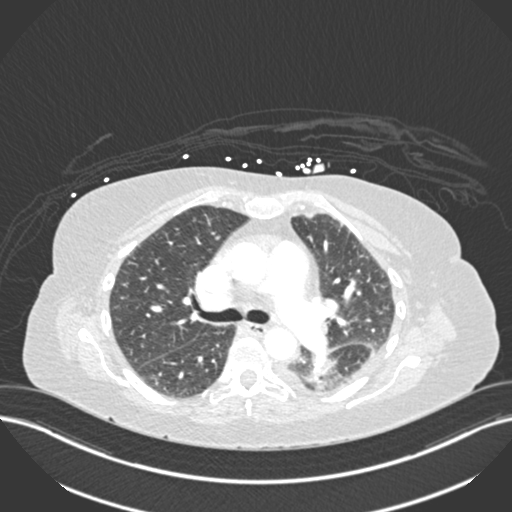
[im 188/282  mediastinal]
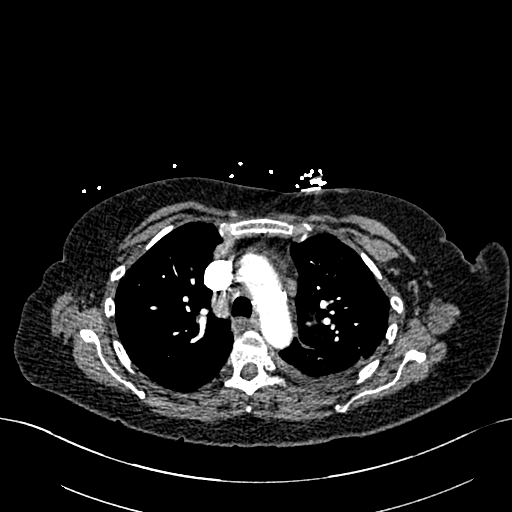
[im 207/282  lung]
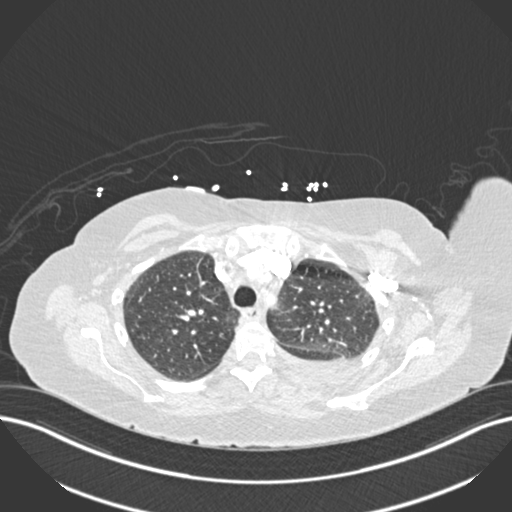
[im 225/282  mediastinal]
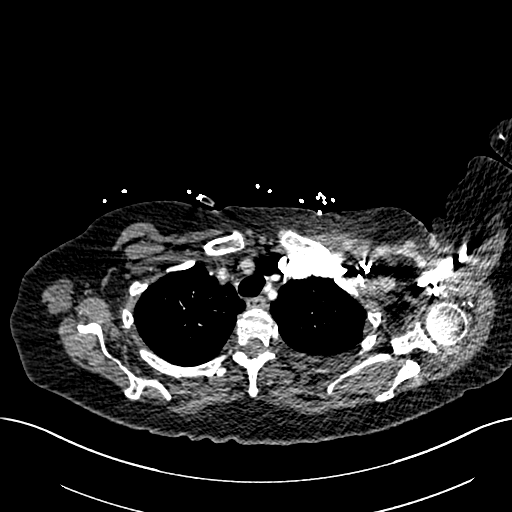
[im 244/282  lung]
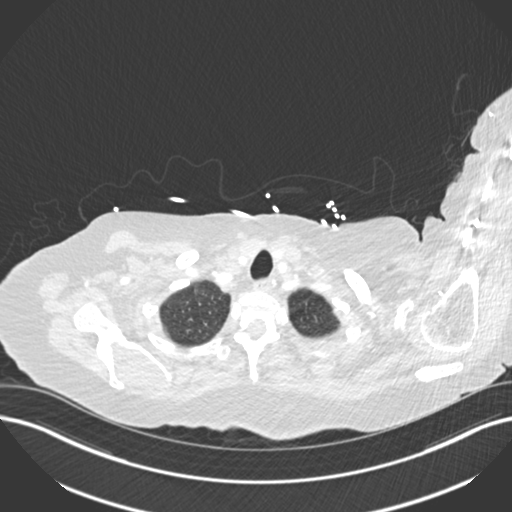
[im 263/282  mediastinal]
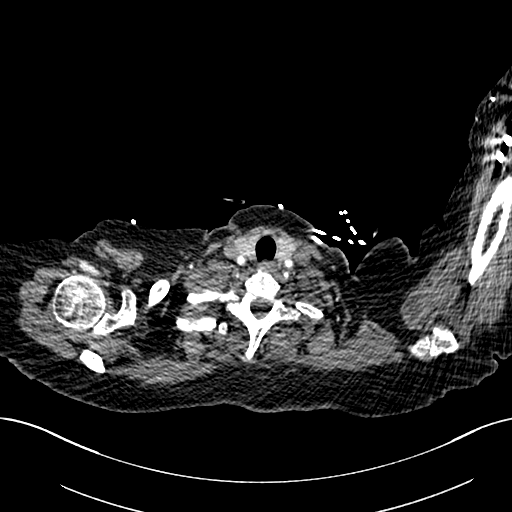

[Series 7: pe lung · axial · 0.95mm/px · z∈[+873,+945]mm · 2 of 74 slices shown]
[im 25/74  mediastinal]
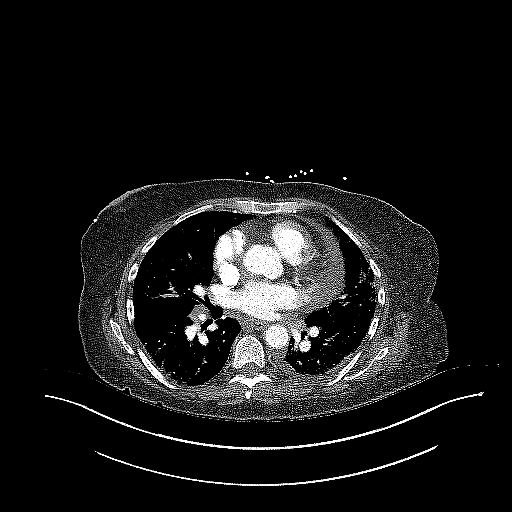
[im 49/74  mediastinal]
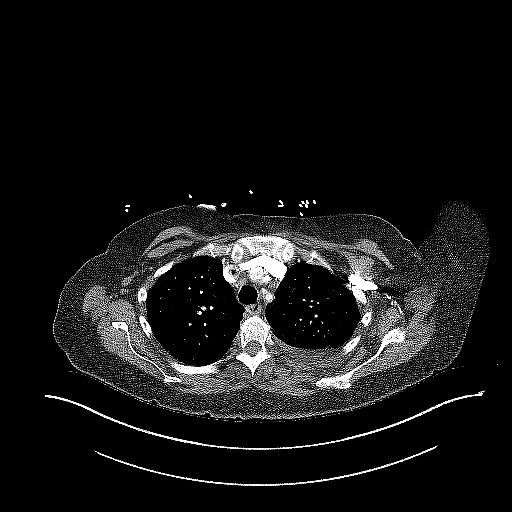

[Series 8: pe coronal mpr · coronal · 0.58mm/px · 1 of 124 slices shown]
[im 62/124  mediastinal]
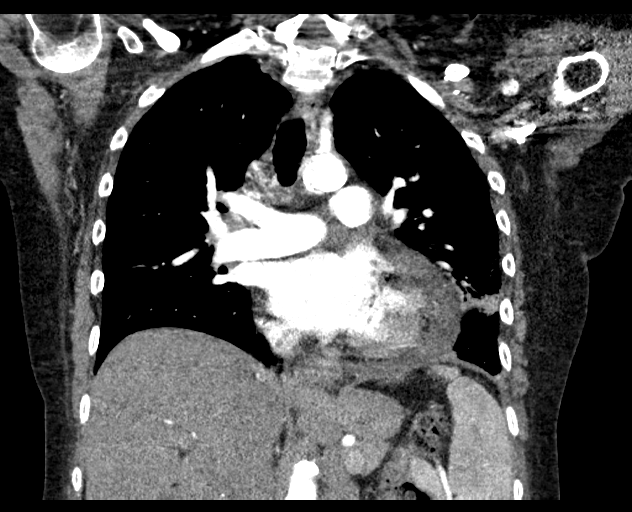

[17 of 36 positions shown; findings below may reference images not displayed]

FINDINGS: Cardiovascular: There is a optimal opacification of the pulmonary
arteries. There is no central,segmental, or subsegmental filling
defects within the pulmonary arteries. There is mild cardiomegaly. A
moderate pericardial effusion is seen. No evidence right heart
strain. There is normal three-vessel brachiocephalic anatomy without
proximal stenosis. Scattered aortic atherosclerosis is seen. There
is coronary artery calcifications present.

Mediastinum/Nodes: No hilar, mediastinal, or axillary adenopathy.
Thyroid gland, trachea, and esophagus demonstrate no significant
findings.

Lungs/Pleura: Patchy airspace opacities are seen at the posterior
bilateral lung bases. There is also a small amount of patchy
airspace opacity at the posterior left upper lobe. There is a trace
left pleural effusion present.

Upper Abdomen: No acute abnormalities present in the visualized
portions of the upper abdomen.

Musculoskeletal: No chest wall abnormality. No acute or significant
osseous findings.

Review of the MIP images confirms the above findings.
IMPRESSION: No central, segmental, or subsegmental pulmonary embolism.

Moderate pericardial effusion with mild cardiomegaly. No evidence of
right ventricular heart strain.

Multifocal patchy airspace opacities, predominantly within both lung
bases which may be due to infectious etiology.

Trace left pleural effusion.

Aortic Atherosclerosis ([id number]).

## 2021-04-03 ENCOUNTER — Other Ambulatory Visit: Payer: Self-pay

## 2021-04-03 ENCOUNTER — Inpatient Hospital Stay (HOSPITAL_BASED_OUTPATIENT_CLINIC_OR_DEPARTMENT_OTHER)
Admission: EM | Admit: 2021-04-03 | Discharge: 2021-04-06 | DRG: 286 | Disposition: A | Discharge status: Home Health | Payer: Medicare HMO | Attending: Family Medicine | Admitting: Family Medicine

## 2021-04-03 ENCOUNTER — Emergency Department (HOSPITAL_BASED_OUTPATIENT_CLINIC_OR_DEPARTMENT_OTHER): Payer: Medicare HMO

## 2021-04-03 ENCOUNTER — Encounter (HOSPITAL_BASED_OUTPATIENT_CLINIC_OR_DEPARTMENT_OTHER): Payer: Self-pay | Admitting: Emergency Medicine

## 2021-04-03 DIAGNOSIS — I3139 Other pericardial effusion (noninflammatory): Secondary | ICD-10-CM

## 2021-04-03 DIAGNOSIS — R590 Localized enlarged lymph nodes: Secondary | ICD-10-CM | POA: Diagnosis present

## 2021-04-03 DIAGNOSIS — I5082 Biventricular heart failure: Secondary | ICD-10-CM | POA: Diagnosis present

## 2021-04-03 DIAGNOSIS — K209 Esophagitis, unspecified without bleeding: Secondary | ICD-10-CM | POA: Diagnosis present

## 2021-04-03 DIAGNOSIS — Z888 Allergy status to other drugs, medicaments and biological substances status: Secondary | ICD-10-CM

## 2021-04-03 DIAGNOSIS — Z79899 Other long term (current) drug therapy: Secondary | ICD-10-CM

## 2021-04-03 DIAGNOSIS — R0902 Hypoxemia: Secondary | ICD-10-CM

## 2021-04-03 DIAGNOSIS — Z8249 Family history of ischemic heart disease and other diseases of the circulatory system: Secondary | ICD-10-CM

## 2021-04-03 DIAGNOSIS — J849 Interstitial pulmonary disease, unspecified: Secondary | ICD-10-CM | POA: Diagnosis not present

## 2021-04-03 DIAGNOSIS — J918 Pleural effusion in other conditions classified elsewhere: Secondary | ICD-10-CM | POA: Diagnosis present

## 2021-04-03 DIAGNOSIS — I6529 Occlusion and stenosis of unspecified carotid artery: Secondary | ICD-10-CM | POA: Diagnosis present

## 2021-04-03 DIAGNOSIS — R062 Wheezing: Secondary | ICD-10-CM

## 2021-04-03 DIAGNOSIS — J90 Pleural effusion, not elsewhere classified: Secondary | ICD-10-CM

## 2021-04-03 DIAGNOSIS — B3781 Candidal esophagitis: Secondary | ICD-10-CM | POA: Diagnosis present

## 2021-04-03 DIAGNOSIS — K31819 Angiodysplasia of stomach and duodenum without bleeding: Secondary | ICD-10-CM | POA: Diagnosis present

## 2021-04-03 DIAGNOSIS — I50810 Right heart failure, unspecified: Secondary | ICD-10-CM | POA: Diagnosis present

## 2021-04-03 DIAGNOSIS — I110 Hypertensive heart disease with heart failure: Secondary | ICD-10-CM | POA: Diagnosis not present

## 2021-04-03 DIAGNOSIS — I7300 Raynaud's syndrome without gangrene: Secondary | ICD-10-CM | POA: Diagnosis present

## 2021-04-03 DIAGNOSIS — K31811 Angiodysplasia of stomach and duodenum with bleeding: Secondary | ICD-10-CM | POA: Diagnosis present

## 2021-04-03 DIAGNOSIS — D500 Iron deficiency anemia secondary to blood loss (chronic): Secondary | ICD-10-CM | POA: Diagnosis present

## 2021-04-03 DIAGNOSIS — N179 Acute kidney failure, unspecified: Secondary | ICD-10-CM

## 2021-04-03 DIAGNOSIS — R0602 Shortness of breath: Secondary | ICD-10-CM | POA: Diagnosis not present

## 2021-04-03 DIAGNOSIS — J9601 Acute respiratory failure with hypoxia: Secondary | ICD-10-CM

## 2021-04-03 DIAGNOSIS — Z86010 Personal history of colonic polyps: Secondary | ICD-10-CM

## 2021-04-03 DIAGNOSIS — E876 Hypokalemia: Secondary | ICD-10-CM | POA: Diagnosis not present

## 2021-04-03 DIAGNOSIS — Z8616 Personal history of COVID-19: Secondary | ICD-10-CM

## 2021-04-03 DIAGNOSIS — I5031 Acute diastolic (congestive) heart failure: Secondary | ICD-10-CM | POA: Insufficient documentation

## 2021-04-03 DIAGNOSIS — I270 Primary pulmonary hypertension: Secondary | ICD-10-CM | POA: Diagnosis present

## 2021-04-03 DIAGNOSIS — E785 Hyperlipidemia, unspecified: Secondary | ICD-10-CM | POA: Diagnosis present

## 2021-04-03 DIAGNOSIS — R7303 Prediabetes: Secondary | ICD-10-CM | POA: Diagnosis present

## 2021-04-03 DIAGNOSIS — Z20822 Contact with and (suspected) exposure to covid-19: Secondary | ICD-10-CM | POA: Diagnosis present

## 2021-04-03 DIAGNOSIS — R7989 Other specified abnormal findings of blood chemistry: Secondary | ICD-10-CM | POA: Diagnosis not present

## 2021-04-03 DIAGNOSIS — Z87891 Personal history of nicotine dependence: Secondary | ICD-10-CM

## 2021-04-03 DIAGNOSIS — Z86718 Personal history of other venous thrombosis and embolism: Secondary | ICD-10-CM

## 2021-04-03 DIAGNOSIS — D509 Iron deficiency anemia, unspecified: Secondary | ICD-10-CM | POA: Diagnosis present

## 2021-04-03 DIAGNOSIS — I5033 Acute on chronic diastolic (congestive) heart failure: Secondary | ICD-10-CM | POA: Diagnosis present

## 2021-04-03 DIAGNOSIS — Z9049 Acquired absence of other specified parts of digestive tract: Secondary | ICD-10-CM

## 2021-04-03 HISTORY — DX: Interstitial pulmonary disease, unspecified: J84.9

## 2021-04-03 HISTORY — DX: Pulmonary hypertension, unspecified: I27.20

## 2021-04-03 LAB — COMPREHENSIVE METABOLIC PANEL
ALT: 6 U/L (ref 0–44)
AST: 10 U/L — ABNORMAL LOW (ref 15–41)
Albumin: 3.1 g/dL — ABNORMAL LOW (ref 3.5–5.0)
Alkaline Phosphatase: 60 U/L (ref 38–126)
Anion gap: 10 (ref 5–15)
BUN: 30 mg/dL — ABNORMAL HIGH (ref 8–23)
CO2: 25 mmol/L (ref 22–32)
Calcium: 8.6 mg/dL — ABNORMAL LOW (ref 8.9–10.3)
Chloride: 103 mmol/L (ref 98–111)
Creatinine, Ser: 1.25 mg/dL — ABNORMAL HIGH (ref 0.44–1.00)
GFR, Estimated: 46 mL/min — ABNORMAL LOW (ref >60)
Glucose, Bld: 132 mg/dL — ABNORMAL HIGH (ref 70–99)
Potassium: 4.2 mmol/L (ref 3.5–5.1)
Sodium: 138 mmol/L (ref 135–145)
Total Bilirubin: 0.6 mg/dL (ref 0.3–1.2)
Total Protein: 6.6 g/dL (ref 6.5–8.1)

## 2021-04-03 LAB — CBC
HCT: 28.1 % — ABNORMAL LOW (ref 36.0–46.0)
Hemoglobin: 8.2 g/dL — ABNORMAL LOW (ref 12.0–15.0)
MCH: 19.8 pg — ABNORMAL LOW (ref 26.0–34.0)
MCHC: 29.2 g/dL — ABNORMAL LOW (ref 30.0–36.0)
MCV: 67.7 fL — ABNORMAL LOW (ref 80.0–100.0)
Platelets: 356 10*3/uL (ref 150–400)
RBC: 4.15 MIL/uL (ref 3.87–5.11)
RDW: 20.5 % — ABNORMAL HIGH (ref 11.5–15.5)
WBC: 8.2 10*3/uL (ref 4.0–10.5)
nRBC: 0 % (ref 0.0–0.2)

## 2021-04-03 LAB — RESP PANEL BY RT-PCR (FLU A&B, COVID) ARPGX2
Influenza A by PCR: NEGATIVE
Influenza B by PCR: NEGATIVE
SARS Coronavirus 2 by RT PCR: NEGATIVE

## 2021-04-03 LAB — BRAIN NATRIURETIC PEPTIDE: B Natriuretic Peptide: 252.9 pg/mL — ABNORMAL HIGH (ref 0.0–100.0)

## 2021-04-03 LAB — D-DIMER, QUANTITATIVE: D-Dimer, Quant: 1.4 ug/mL-FEU — ABNORMAL HIGH (ref 0.00–0.50)

## 2021-04-03 MED ORDER — IPRATROPIUM-ALBUTEROL 0.5-2.5 (3) MG/3ML IN SOLN
3.0000 mL | Freq: Once | RESPIRATORY_TRACT | Status: AC
Start: 2021-04-03 — End: 2021-04-03
  Administered 2021-04-03: 3 mL via RESPIRATORY_TRACT
  Filled 2021-04-03: qty 3

## 2021-04-03 MED ORDER — FUROSEMIDE 10 MG/ML IJ SOLN
80.0000 mg | Freq: Once | INTRAMUSCULAR | Status: AC
  Administered 2021-04-03: 80 mg via INTRAVENOUS
  Filled 2021-04-03: qty 8

## 2021-04-03 MED ORDER — IOHEXOL 350 MG/ML SOLN
100.0000 mL | Freq: Once | INTRAVENOUS | Status: AC | PRN
  Administered 2021-04-03: 59 mL via INTRAVENOUS

## 2021-04-03 NOTE — ED Notes (Addendum)
Patient transported to CT ?

## 2021-04-03 NOTE — ED Triage Notes (Signed)
Patient c/o shortness of breath over the past 3 weeks. Saw cardiology last Monday and was told to follow up with pulmonology to get home 02. Unable to be seen until this week. Today pt oxygen dropped 83%. Placed on 2L [place] by RT. Pt has lower leg swelling noted worse in the left foot.  ?

## 2021-04-03 NOTE — ED Notes (Signed)
RT Note: Pt. placed on 2 lpm n/c after arriving for S.O.B. with room air saturations of 87-88%, under care of [facility]/Dr. Elsworth Soho.  ?

## 2021-04-03 NOTE — ED Notes (Signed)
RT Note: Pt. hx. of (PPH)Primary Pulmonary Hypertension/(ILD) Interstitual Lung Disease. ?

## 2021-04-03 NOTE — ED Provider Notes (Signed)
?[facility] EMERG[facility] DEPT ?Provider Note ? ? ?CSN: [id number] ?Arrival date & time: 04/03/21  1831 ? ?  ? ?History ? ?Chief Complaint  ?Patient presents with  ? Shortness of Breath  ? ? ?Jacqueline Harris is a 76 y.o. female. ? ?Patient followed by [facility].  Last saw them on March 13.  Patient also followed by pulmonary medicine for interstitial lung disease and also known to have pulmonary hypertension as well as CHF.  Patient also with complaint increased shortness of breath.  Does not have home oxygen.  Oxygen sats are good down to the 80s at home.  Here at rest she will go down to 88% on room air.  Also has some increased right lower extremity edema.  Denies any chest pain.  She has been feeling more short of breath for the past 3 weeks.  But is gotten much worse just in the past few days.  Started on 2 L of oxygen here and feeling much better oxygen sats came up into the mid 90s. ? ?Past medical history is significant for being on Lasix but just 20 mg every other day.  Patient is known to have a history of acute deep vein thrombosis.  That was in 2021.  Known to have congestive heart failure known to have pulmonary hypertension known to have interstitial lung disease known to have hyperlipidemia.  Patient has had her gallbladder removed. ? ? ?  ? ?Home Medications ?Prior to Admission medications   ?Medication Sig Start Date End Date Taking? Authorizing Provider  ?acetaminophen (TYLENOL) 650 MG CR tablet Take 650 mg by mouth every 8 (eight) hours as needed for pain.    [provider]  ?ALPRAZolam Duanne Moron) 0.5 MG tablet Take 0.5 mg by mouth at bedtime as needed for anxiety.    [provider]  ?citalopram (CELEXA) 20 MG tablet Take 20 mg by mouth daily.    [provider]  ?fluconazole (DIFLUCAN) 200 MG tablet Take 2 tablets (400 mg total) by mouth daily for 14 days. 03/30/21 04/13/21  Vu, Johnny Bridge T, MD  ?furosemide (LASIX) 20 MG tablet TAKE 1 TABLET BY MOUTH EVERY DAY 03/31/21    [facility], Shaune Pascal, MD  ?hydrOXYzine (VISTARIL) 25 MG capsule Take 25 mg by mouth at bedtime as needed (sleep). 02/24/21   [provider]  ?macitentan (OPSUMIT) 10 MG tablet Take 1 tablet (10 mg total) by mouth daily. 01/18/21   [facility], Shaune Pascal, MD  ?Multiple Vitamins-Minerals (MULTIVITAMIN WITH MINERALS) tablet Take 1 tablet by mouth daily.    [provider]  ?omeprazole (PRILOSEC) 40 MG capsule Take 1 capsule (40 mg total) by mouth 2 (two) times daily. 03/18/21 06/10/21  Sharyn Creamer, MD  ?potassium chloride (KLOR-CON) 10 MEQ tablet TAKE 1 TABLET BY MOUTH EVERY DAY 03/31/21   [facility], Shaune Pascal, MD  ?sildenafil (REVATIO) 20 MG tablet Take 1 tablet (20 mg total) by mouth 3 (three) times daily. 03/28/21   [facility], Shaune Pascal, MD  ?   ? ?Allergies    ?Spironolactone   ? ?Review of Systems   ?Review of Systems  ?Constitutional:  Negative for chills and fever.  ?HENT:  Negative for ear pain and sore throat.   ?Eyes:  Negative for pain and visual disturbance.  ?Respiratory:  Positive for shortness of breath. Negative for cough.   ?Cardiovascular:  Positive for leg swelling. Negative for chest pain and palpitations.  ?Gastrointestinal:  Negative for abdominal pain and vomiting.  ?Genitourinary:  Negative for dysuria  and hematuria.  ?Musculoskeletal:  Negative for arthralgias and back pain.  ?Skin:  Negative for color change and rash.  ?Neurological:  Negative for seizures and syncope.  ?All other systems reviewed and are negative. ? ?Physical Exam ?Updated Vital Signs ?BP (!) 117/45   Pulse 85   Temp 98 ?F (36.7 ?C) (Oral)   Resp (!) 24   Ht 1.626 m (_0 )   Wt 56.2 kg   SpO2 96%   BMI 21.28 kg/m?  ?Physical Exam ?Vitals and nursing note reviewed.  ?Constitutional:   ?   General: She is not in acute distress. ?   Appearance: Normal appearance. She is well-developed.  ?HENT:  ?   Head: Normocephalic and atraumatic.  ?Eyes:  ?   Extraocular Movements: Extraocular movements intact.  ?    Conjunctiva/sclera: Conjunctivae normal.  ?   Pupils: Pupils are equal, round, and reactive to light.  ?Cardiovascular:  ?   Rate and Rhythm: Normal rate and regular rhythm.  ?   Heart sounds: No murmur heard. ?Pulmonary:  ?   Effort: Pulmonary effort is normal. No respiratory distress.  ?   Breath sounds: Rales present. No wheezing or rhonchi.  ?Abdominal:  ?   General: There is no distension.  ?   Palpations: Abdomen is soft.  ?   Tenderness: There is no abdominal tenderness.  ?Musculoskeletal:     ?   General: No swelling.  ?   Cervical back: Normal range of motion and neck supple.  ?   Right lower leg: Edema present.  ?   Left lower leg: No edema.  ?Skin: ?   General: Skin is warm and dry.  ?   Capillary Refill: Capillary refill takes less than 2 seconds.  ?Neurological:  ?   General: No focal deficit present.  ?   Mental Status: She is alert and oriented to person, place, and time.  ?Psychiatric:     ?   Mood and Affect: Mood normal.  ? ? ?ED Results / Procedures / Treatments   ?Labs ?(all labs ordered are listed, but only abnormal results are displayed) ?Labs Reviewed  ?COMPREHENSIVE METABOLIC PANEL - Abnormal; Notable for the following components:  ?    Result Value  ? Glucose, Bld 132 (*)   ? BUN 30 (*)   ? Creatinine, Ser 1.25 (*)   ? Calcium 8.6 (*)   ? Albumin 3.1 (*)   ? AST 10 (*)   ? GFR, Estimated 46 (*)   ? All other components within normal limits  ?BRAIN NATRIURETIC PEPTIDE - Abnormal; Notable for the following components:  ? B Natriuretic Peptide 252.9 (*)   ? All other components within normal limits  ?CBC - Abnormal; Notable for the following components:  ? Hemoglobin 8.2 (*)   ? HCT 28.1 (*)   ? MCV 67.7 (*)   ? MCH 19.8 (*)   ? MCHC 29.2 (*)   ? RDW 20.5 (*)   ? All other components within normal limits  ?D-DIMER, QUANTITATIVE - Abnormal; Notable for the following components:  ? D-Dimer, Quant 1.40 (*)   ? All other components within normal limits  ?RESP PANEL BY RT-PCR (FLU A&B, COVID) [id number]   ? ? ?EKG ?EKG Interpretation ? ?Date/Time:  Sunday April 03 2021 19:17:43 EDT ?Ventricular Rate:  88 ?PR Interval:  165 ?QRS Duration: 101 ?QT Interval:  384 ?QTC Calculation: 465 ?R Axis:   220 ?Text Interpretation: Sinus rhythm Right axis deviation Artifact No significant  change since last tracing Confirmed by Fredia Sorrow [phone]) on 04/03/2021 7:23:39 PM ? ?Radiology ?CT Angio Chest PE W/Cm &/Or Wo Cm ? ?Result Date: 04/03/2021 ?CLINICAL DATA:  Pulmonary embolism (PE) suspected, positive D-dimer Shortness of breath.  Elevated D-dimer. EXAM: CT ANGIOGRAPHY CHEST WITH CONTRAST TECHNIQUE: Multidetector CT imaging of the chest was performed using the standard protocol during bolus administration of intravenous contrast. Multiplanar CT image reconstructions and MIPs were obtained to evaluate the vascular anatomy. RADIATION DOSE REDUCTION: This exam was performed according to the departmental dose-optimization program which includes automated exposure control, adjustment of the mA and/or kV according to patient size and/or use of iterative reconstruction technique. CONTRAST:  71m OMNIPAQUE IOHEXOL 350 MG/ML SOLN COMPARISON:  Chest radiograph earlier today. Chest CT 02/16/2021. chest CT 12/21/2019 also reviewed FINDINGS: Cardiovascular: There are no filling defects within the pulmonary arteries to suggest pulmonary embolus. No subsegmental pulmonary arteries are not well assessed due to contrast bolus timing. Aortic atherosclerosis. No aortic dissection or acute aortic findings. The left vertebral artery arises directly from the thoracic aorta, variant arch anatomy coronary artery calcifications. Multi chamber cardiomegaly. A moderate-sized pericardial effusion has increased from prior chest CT. This measures a new air from 12-21 mm in depth. There is no convincing pericardial enhancement or nodularity. Mediastinum/Nodes: Shotty bilateral hilar lymph nodes measuring up to 10 mm on the left and 14 mm on the right 12  mm subcarinal node. No thyroid nodule. Small hiatal hernia. The previous left paraesophageal soft tissue attenuation structure is poorly defined on the current exam due to adjacent pleural and pericardial effusion,

## 2021-04-04 ENCOUNTER — Inpatient Hospital Stay (HOSPITAL_COMMUNITY): Payer: Medicare HMO

## 2021-04-04 ENCOUNTER — Encounter (HOSPITAL_COMMUNITY): Payer: Self-pay | Admitting: Internal Medicine

## 2021-04-04 DIAGNOSIS — K31811 Angiodysplasia of stomach and duodenum with bleeding: Secondary | ICD-10-CM | POA: Diagnosis not present

## 2021-04-04 DIAGNOSIS — I110 Hypertensive heart disease with heart failure: Secondary | ICD-10-CM | POA: Diagnosis not present

## 2021-04-04 DIAGNOSIS — Z86718 Personal history of other venous thrombosis and embolism: Secondary | ICD-10-CM | POA: Diagnosis not present

## 2021-04-04 DIAGNOSIS — I5031 Acute diastolic (congestive) heart failure: Secondary | ICD-10-CM | POA: Diagnosis not present

## 2021-04-04 DIAGNOSIS — D509 Iron deficiency anemia, unspecified: Secondary | ICD-10-CM | POA: Diagnosis not present

## 2021-04-04 DIAGNOSIS — I5032 Chronic diastolic (congestive) heart failure: Secondary | ICD-10-CM

## 2021-04-04 DIAGNOSIS — K209 Esophagitis, unspecified without bleeding: Secondary | ICD-10-CM | POA: Diagnosis not present

## 2021-04-04 DIAGNOSIS — J849 Interstitial pulmonary disease, unspecified: Secondary | ICD-10-CM

## 2021-04-04 DIAGNOSIS — I270 Primary pulmonary hypertension: Secondary | ICD-10-CM | POA: Diagnosis not present

## 2021-04-04 DIAGNOSIS — J90 Pleural effusion, not elsewhere classified: Secondary | ICD-10-CM | POA: Diagnosis not present

## 2021-04-04 DIAGNOSIS — J9601 Acute respiratory failure with hypoxia: Secondary | ICD-10-CM

## 2021-04-04 DIAGNOSIS — Z9049 Acquired absence of other specified parts of digestive tract: Secondary | ICD-10-CM | POA: Diagnosis not present

## 2021-04-04 DIAGNOSIS — I50810 Right heart failure, unspecified: Secondary | ICD-10-CM

## 2021-04-04 DIAGNOSIS — Z20822 Contact with and (suspected) exposure to covid-19: Secondary | ICD-10-CM | POA: Diagnosis not present

## 2021-04-04 DIAGNOSIS — R091 Pleurisy: Secondary | ICD-10-CM | POA: Diagnosis not present

## 2021-04-04 DIAGNOSIS — K31819 Angiodysplasia of stomach and duodenum without bleeding: Secondary | ICD-10-CM | POA: Diagnosis not present

## 2021-04-04 DIAGNOSIS — I272 Pulmonary hypertension, unspecified: Secondary | ICD-10-CM | POA: Diagnosis not present

## 2021-04-04 DIAGNOSIS — B3781 Candidal esophagitis: Secondary | ICD-10-CM | POA: Diagnosis present

## 2021-04-04 DIAGNOSIS — R590 Localized enlarged lymph nodes: Secondary | ICD-10-CM

## 2021-04-04 DIAGNOSIS — I5082 Biventricular heart failure: Secondary | ICD-10-CM | POA: Diagnosis present

## 2021-04-04 DIAGNOSIS — N179 Acute kidney failure, unspecified: Secondary | ICD-10-CM

## 2021-04-04 DIAGNOSIS — I3139 Other pericardial effusion (noninflammatory): Secondary | ICD-10-CM

## 2021-04-04 DIAGNOSIS — Z888 Allergy status to other drugs, medicaments and biological substances status: Secondary | ICD-10-CM | POA: Diagnosis not present

## 2021-04-04 DIAGNOSIS — J918 Pleural effusion in other conditions classified elsewhere: Secondary | ICD-10-CM | POA: Diagnosis present

## 2021-04-04 DIAGNOSIS — Z86010 Personal history of colonic polyps: Secondary | ICD-10-CM | POA: Diagnosis not present

## 2021-04-04 DIAGNOSIS — R7303 Prediabetes: Secondary | ICD-10-CM | POA: Diagnosis present

## 2021-04-04 DIAGNOSIS — Z79899 Other long term (current) drug therapy: Secondary | ICD-10-CM | POA: Diagnosis not present

## 2021-04-04 DIAGNOSIS — I6529 Occlusion and stenosis of unspecified carotid artery: Secondary | ICD-10-CM | POA: Diagnosis present

## 2021-04-04 DIAGNOSIS — E785 Hyperlipidemia, unspecified: Secondary | ICD-10-CM | POA: Diagnosis present

## 2021-04-04 DIAGNOSIS — D500 Iron deficiency anemia secondary to blood loss (chronic): Secondary | ICD-10-CM | POA: Diagnosis present

## 2021-04-04 DIAGNOSIS — I5033 Acute on chronic diastolic (congestive) heart failure: Secondary | ICD-10-CM | POA: Diagnosis not present

## 2021-04-04 DIAGNOSIS — I7300 Raynaud's syndrome without gangrene: Secondary | ICD-10-CM | POA: Diagnosis present

## 2021-04-04 DIAGNOSIS — Z8616 Personal history of COVID-19: Secondary | ICD-10-CM | POA: Diagnosis not present

## 2021-04-04 HISTORY — PX: IR THORACENTESIS ASP PLEURAL SPACE W/IMG GUIDE: IMG5380

## 2021-04-04 LAB — ECHOCARDIOGRAM COMPLETE
AR max vel: 1.98 cm2
AV Area VTI: 2.14 cm2
AV Area mean vel: 2.06 cm2
AV Mean grad: 7 mmHg
AV Peak grad: 12.8 mmHg
Ao pk vel: 1.79 m/s
Area-P 1/2: 3.83 cm2
Calc EF: 47.1 %
Height: 64 in
S' Lateral: 3 cm
Single Plane A2C EF: 37.8 %
Single Plane A4C EF: 54.4 %
Weight: 1984 oz

## 2021-04-04 LAB — CBC WITH DIFFERENTIAL/PLATELET
Abs Immature Granulocytes: 0.03 10*3/uL (ref 0.00–0.07)
Basophils Absolute: 0 10*3/uL (ref 0.0–0.1)
Basophils Relative: 1 %
Eosinophils Absolute: 0.2 10*3/uL (ref 0.0–0.5)
Eosinophils Relative: 3 %
HCT: 25.3 % — ABNORMAL LOW (ref 36.0–46.0)
Hemoglobin: 7.4 g/dL — ABNORMAL LOW (ref 12.0–15.0)
Immature Granulocytes: 1 %
Lymphocytes Relative: 15 %
Lymphs Abs: 0.9 10*3/uL (ref 0.7–4.0)
MCH: 19.6 pg — ABNORMAL LOW (ref 26.0–34.0)
MCHC: 29.2 g/dL — ABNORMAL LOW (ref 30.0–36.0)
MCV: 67.1 fL — ABNORMAL LOW (ref 80.0–100.0)
Monocytes Absolute: 0.9 10*3/uL (ref 0.1–1.0)
Monocytes Relative: 15 %
Neutro Abs: 3.8 10*3/uL (ref 1.7–7.7)
Neutrophils Relative %: 65 %
Platelets: 308 10*3/uL (ref 150–400)
RBC: 3.77 MIL/uL — ABNORMAL LOW (ref 3.87–5.11)
RDW: 19.9 % — ABNORMAL HIGH (ref 11.5–15.5)
WBC: 5.8 10*3/uL (ref 4.0–10.5)
nRBC: 0 % (ref 0.0–0.2)

## 2021-04-04 LAB — COMPREHENSIVE METABOLIC PANEL
ALT: 9 U/L (ref 0–44)
AST: 13 U/L — ABNORMAL LOW (ref 15–41)
Albumin: 2.3 g/dL — ABNORMAL LOW (ref 3.5–5.0)
Alkaline Phosphatase: 51 U/L (ref 38–126)
Anion gap: 10 (ref 5–15)
BUN: 23 mg/dL (ref 8–23)
CO2: 25 mmol/L (ref 22–32)
Calcium: 8.3 mg/dL — ABNORMAL LOW (ref 8.9–10.3)
Chloride: 102 mmol/L (ref 98–111)
Creatinine, Ser: 1.3 mg/dL — ABNORMAL HIGH (ref 0.44–1.00)
GFR, Estimated: 43 mL/min — ABNORMAL LOW (ref >60)
Glucose, Bld: 109 mg/dL — ABNORMAL HIGH (ref 70–99)
Potassium: 3.8 mmol/L (ref 3.5–5.1)
Sodium: 137 mmol/L (ref 135–145)
Total Bilirubin: 0.7 mg/dL (ref 0.3–1.2)
Total Protein: 5.6 g/dL — ABNORMAL LOW (ref 6.5–8.1)

## 2021-04-04 LAB — BODY FLUID CELL COUNT WITH DIFFERENTIAL
Eos, Fluid: 0 %
Lymphs, Fluid: 62 %
Monocyte-Macrophage-Serous Fluid: 34 % — ABNORMAL LOW (ref 50–90)
Neutrophil Count, Fluid: 4 % (ref 0–25)
Total Nucleated Cell Count, Fluid: 187 cu mm (ref 0–1000)

## 2021-04-04 LAB — PREPARE RBC (CROSSMATCH)

## 2021-04-04 LAB — LACTATE DEHYDROGENASE, PLEURAL OR PERITONEAL FLUID: LD, Fluid: 49 U/L — ABNORMAL HIGH (ref 3–23)

## 2021-04-04 LAB — SEDIMENTATION RATE: Sed Rate: 11 mm/hr (ref 0–22)

## 2021-04-04 LAB — PROTEIN, PLEURAL OR PERITONEAL FLUID: Total protein, fluid: 3 g/dL

## 2021-04-04 LAB — GLUCOSE, PLEURAL OR PERITONEAL FLUID: Glucose, Fluid: 127 mg/dL

## 2021-04-04 LAB — ABO/RH: ABO/RH(D): O NEG

## 2021-04-04 LAB — ALBUMIN, PLEURAL OR PERITONEAL FLUID: Albumin, Fluid: 1.7 g/dL

## 2021-04-04 LAB — MAGNESIUM: Magnesium: 1.6 mg/dL — ABNORMAL LOW (ref 1.7–2.4)

## 2021-04-04 LAB — LACTATE DEHYDROGENASE: LDH: 136 U/L (ref 98–192)

## 2021-04-04 MED ORDER — ONDANSETRON HCL 4 MG/2ML IJ SOLN: 4.0000 mg | Freq: Four times a day (QID) | INTRAMUSCULAR | Status: DC | PRN

## 2021-04-04 MED ORDER — POTASSIUM CHLORIDE CRYS ER 20 MEQ PO TBCR
40.0000 meq | EXTENDED_RELEASE_TABLET | Freq: Once | ORAL | Status: AC
Start: 2021-04-04 — End: 2021-04-04
  Administered 2021-04-04: 40 meq via ORAL
  Filled 2021-04-04: qty 2

## 2021-04-04 MED ORDER — SODIUM CHLORIDE 0.9% FLUSH
3.0000 mL | Freq: Two times a day (BID) | INTRAVENOUS | Status: DC
  Administered 2021-04-04 – 2021-04-06 (×5): 3 mL via INTRAVENOUS

## 2021-04-04 MED ORDER — FLUCONAZOLE 200 MG PO TABS
400.0000 mg | ORAL_TABLET | Freq: Every day | ORAL | Status: DC
  Administered 2021-04-04 – 2021-04-06 (×3): 400 mg via ORAL
  Filled 2021-04-04 (×3): qty 2

## 2021-04-04 MED ORDER — LIDOCAINE HCL 1 % IJ SOLN
INTRAMUSCULAR | Status: AC
  Filled 2021-04-04: qty 20

## 2021-04-04 MED ORDER — ORAL CARE MOUTH RINSE
15.0000 mL | Freq: Two times a day (BID) | OROMUCOSAL | Status: DC
  Administered 2021-04-04 – 2021-04-05 (×3): 15 mL via OROMUCOSAL

## 2021-04-04 MED ORDER — SODIUM CHLORIDE 0.9 % IV SOLN
125.0000 mg | Freq: Once | INTRAVENOUS | Status: AC
  Administered 2021-04-04: 125 mg via INTRAVENOUS
  Filled 2021-04-04: qty 10

## 2021-04-04 MED ORDER — HYDROXYZINE PAMOATE 25 MG PO CAPS
25.0000 mg | ORAL_CAPSULE | Freq: Every evening | ORAL | Status: DC | PRN
  Filled 2021-04-04: qty 1

## 2021-04-04 MED ORDER — HYDROXYZINE HCL 25 MG PO TABS: 25.0000 mg | ORAL_TABLET | Freq: Every evening | ORAL | Status: DC | PRN

## 2021-04-04 MED ORDER — SODIUM CHLORIDE 0.9% FLUSH: 3.0000 mL | INTRAVENOUS | Status: DC | PRN

## 2021-04-04 MED ORDER — SILDENAFIL CITRATE 20 MG PO TABS
20.0000 mg | ORAL_TABLET | Freq: Three times a day (TID) | ORAL | Status: DC
  Administered 2021-04-04 – 2021-04-06 (×8): 20 mg via ORAL
  Filled 2021-04-04 (×8): qty 1

## 2021-04-04 MED ORDER — PANTOPRAZOLE SODIUM 40 MG PO TBEC
40.0000 mg | DELAYED_RELEASE_TABLET | Freq: Two times a day (BID) | ORAL | Status: DC
  Administered 2021-04-04 – 2021-04-06 (×5): 40 mg via ORAL
  Filled 2021-04-04 (×5): qty 1

## 2021-04-04 MED ORDER — CITALOPRAM HYDROBROMIDE 20 MG PO TABS
20.0000 mg | ORAL_TABLET | Freq: Every day | ORAL | Status: DC
  Administered 2021-04-04 – 2021-04-06 (×3): 20 mg via ORAL
  Filled 2021-04-04 (×3): qty 1

## 2021-04-04 MED ORDER — MAGNESIUM SULFATE 4 GM/100ML IV SOLN
4.0000 g | Freq: Once | INTRAVENOUS | Status: AC
  Administered 2021-04-04: 4 g via INTRAVENOUS
  Filled 2021-04-04: qty 100

## 2021-04-04 MED ORDER — SODIUM CHLORIDE 0.9 % IV SOLN: 250.0000 mL | INTRAVENOUS | Status: DC | PRN

## 2021-04-04 MED ORDER — SODIUM CHLORIDE 0.9% IV SOLUTION: Freq: Once | INTRAVENOUS | Status: AC

## 2021-04-04 MED ORDER — MACITENTAN 10 MG PO TABS
10.0000 mg | ORAL_TABLET | Freq: Every day | ORAL | Status: DC
  Administered 2021-04-04 – 2021-04-06 (×3): 10 mg via ORAL
  Filled 2021-04-04 (×3): qty 1

## 2021-04-04 MED ORDER — ADULT MULTIVITAMIN W/MINERALS CH
1.0000 | ORAL_TABLET | Freq: Every day | ORAL | Status: DC
  Administered 2021-04-04 – 2021-04-06 (×3): 1 via ORAL
  Filled 2021-04-04 (×3): qty 1

## 2021-04-04 MED ORDER — ENSURE ENLIVE PO LIQD
237.0000 mL | Freq: Three times a day (TID) | ORAL | Status: DC
  Administered 2021-04-04 – 2021-04-06 (×3): 237 mL via ORAL

## 2021-04-04 MED ORDER — ACETAMINOPHEN 325 MG PO TABS
650.0000 mg | ORAL_TABLET | ORAL | Status: DC | PRN
  Administered 2021-04-05: 650 mg via ORAL
  Filled 2021-04-04: qty 2

## 2021-04-04 MED ORDER — FUROSEMIDE 10 MG/ML IJ SOLN
80.0000 mg | Freq: Once | INTRAMUSCULAR | Status: AC
  Administered 2021-04-04: 80 mg via INTRAVENOUS
  Filled 2021-04-04: qty 8

## 2021-04-04 MED ORDER — ALPRAZOLAM 0.5 MG PO TABS: 0.5000 mg | ORAL_TABLET | Freq: Every evening | ORAL | Status: DC | PRN

## 2021-04-04 MED ORDER — FUROSEMIDE 10 MG/ML IJ SOLN
80.0000 mg | Freq: Two times a day (BID) | INTRAMUSCULAR | Status: DC
  Administered 2021-04-04: 80 mg via INTRAVENOUS
  Filled 2021-04-04: qty 8

## 2021-04-04 MED ORDER — MACITENTAN 10 MG PO TABS: 10.0000 mg | ORAL_TABLET | Freq: Every day | ORAL | Status: DC

## 2021-04-04 NOTE — TOC CM/SW Note (Signed)
HF TOC CM spoke to pt's dtr, [name] at bedside. Pt down for a procedure. Dtr states she provided transportation to appt. Pt has a Rollator at home. Discussed with dtr oxygen and 3n1 bedside for home. Will need qualifying sats for home oxygen. Reports pt had HH in the past, will continue to follow for dc needs. Jonnie Finner RN3 CCM, Heart Failure TOC CM [phone]  ?

## 2021-04-04 NOTE — Consult Note (Addendum)
?  ?Advanced Heart Failure Team Consult Note ? ? ?Primary Physician: Glenda Chroman, MD ?PCP-Cardiologist:  Rozann Lesches, MD ?HF: Dr. Haroldine Laws ? ?Reason for Consultation: PAH/Acute on chronic diastolic CHF with RV failure, pericardial effusion ? ?HPI:   ? ?Jacqueline Harris is seen today for evaluation of PAH/acute on chronic diastolic CHF with RV failure and pericardial effusion at the request of Dr. Bridgett Larsson with [facility].  ? ?76 y.o. female with h/o HTN, tobacco use  referred by Dr. Domenic Polite for further evaluation of pulmonary HTN.  ?  ?History includes COVID-19 in 9/21 (unvaccinated) than admitted to [facility] in 11/21 with PNA. Cardiology was involved in her care at that time with finding of a small pericardial effusion, also moderate RV dysfunction with severely elevated estimated RVSP of 68 mmHg.LVEF was normal.  It was suspected that this was related to acute pulmonary process at that point.  She also had an acute left peroneal vein DVT, treated with Eliquis.  Chest CTA at that time showed no pulmonary embolus. ?  ?She saw Jacqueline Harris in f/u. Echo 03/31/20 EF 60-65% Mildly dilated RV with mild RV dysfunction. Moderate TR. RVSP 70mHG. Moderate pericardial effusion  ?  ?Had carotid u/s with Dr. VWoody Sellerbilateral 40-46% stenosis  ?  ?Echo 12/15/20 EF 60-65% Mild LVH G1DD. D-shaped septum RV dilated. Mild to moderately HK. RVSP 783mG. Large pericardial effusion (no tamponade) ?  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 No personal or family h/o CTD. Does have Raynaud's.  ?  ?[place] ?RA = 4 ?RV = 67/4 ?PA = 68/22 (41) ?PCW = 3 ?Fick cardiac output/index = 7.3/4.4 ?Thermo CO/CI = 5.9/3.5 ?PVR = 5.2 (Fick) 6.5 (Thermo) ?Ao sat = 99% ?PA sat = 71%, 74% ?High SVC sat = 73% ?  ?Selective lower lobe R & L pulmonary angios: Mild distal pruning no evidence of clot ?  ?Has been on sildenafil 20 tid. In 9/22 started on macitentan but stopped after two weeks due to bloating and  worsening SOB. Also being treated for acute Fe-def anemia at the time. Has been following with GI (Dr. DoLorenso Courier  ?  ?EGD 12/22: showed fungal esophagitis and gastric AVMs. ?  ?RHChittenden2/22  ?RA = 11 ?RV = 82/11 ?PA = 85/28 (48) ?PCW = 14 ?Fick cardiac output/index = 7.3/4.6 ?PVR = 4.6 WU ?Ao sat = 91% ?PA sat = 68%, 69% ?PaPI = 6.5 ?  ?PFTs 12/22 ?FEV 1.18 (54%) ?FVC 1.50 (51%) ?DLCO 45% ? ?In 12/22 we increased sildenafil to 60 tid. After [facility] we added back macitentan. She is tolerating that but stopped sildenafil due to blurry vision which got better after 2-3 weeks of stopping sildenafil.  ? ?Seen for follow-up 03/28/21. Noted O2 sats dropping 91%> 83% since EGD in 02/23. Appeared volume overloaded. Furosemide increased to 40 mg daily X 3 days (takes 20 mg daily). Sildenafil added back. Echo planned to reassess RV and pericardial effusion. ? ?[facility] with worsening dyspnea and hypoxia, O2 down to 83% on RA on RA. No improvement after increasing diuretics a few days. Notes increased leg edema, no orthopnea or PND. Denies hemathohezia or melena. Labs significant for BNP 252, Ddimer 1.40, Scr 1.25, Hgb 8.2, MCV 67.7, platelet 356. SR on ECG. CTA with no evidence of PE, but did show moderate to large left and moderate right sided pleural effusions, pulmonary edema, moderate pericardial effusion, left paraesophageal soft  tissue attenuation. She was given 80 mg lasix IV and duoneb, and subsequently transferred to [facility] for admission. She was admitted under Hospitalist service. Thoracentesis has been arranged.  ? ?Awaiting echo. ? ?Lives alone. Has been independent in ADLs. Daughter has needed to assist her the last few weeks d/t increasing weakness and dyspnea. ? ?Review of Systems: [y] = yes, _0  = no  ? ?General: Weight gain _1 ; Weight loss _2 ; Anorexia _3 ; Fatigue [Y]; Fever _4 ; Chills _5 ; Weakness _6   ?Cardiac: Chest pain/pressure _7 ; Resting SOB [Y]; Exertional SOB [Y];  Orthopnea _8 ; Pedal Edema [Y]; Palpitations _9 ; Syncope _10 ; Presyncope _11 ; Paroxysmal nocturnal dyspnea_12   ?Pulmonary: Cough _13 ; Wheezing_14 ; Hemoptysis_15 ; Sputum _16 ; Snoring _17   ?GI: Vomiting_18 ; Dysphagia_19 ; Melena_20 ; Hematochezia _21 ; Heartburn_22 ; Abdominal pain _23 ; Constipation _24 ; Diarrhea _25 ; BRBPR _26   ?GU: Hematuria_27 ; Dysuria _28 ; Nocturia_29   ?Vascular: Pain in legs with walking _30 ; Pain in feet with lying flat _31 ; Non-healing sores _32 ; Stroke _33 ; TIA _34 ; Slurred speech _35 ;  ?Neuro: Headaches_36 ; Vertigo_37 ; Seizures_38 ; Paresthesias_39 ;Blurred vision _40 ; Diplopia _41 ; Vision changes _42   ?Ortho/Skin: Arthritis _43 ; Joint pain _44 ; Muscle pain _45 ; Joint swelling _46 ; Back Pain _47 ; Rash _48   ?Psych: Depression_49 ; Anxiety_50   ?Heme: Bleeding problems _51 ; Clotting disorders _52 ; Anemia [Y]  ?Endocrine: Diabetes _53 ; Thyroid dysfunction_54  ? ?Home Medications ?Prior to Admission medications   ?Medication Sig Start Date End Date Taking? Authorizing Provider  ?acetaminophen (TYLENOL) 650 MG CR tablet Take 650 mg by mouth every 8 (eight) hours as needed for pain.    [provider]  ?ALPRAZolam Duanne Moron) 0.5 MG tablet Take 0.5 mg by mouth at bedtime as needed for anxiety.    [provider]  ?citalopram (CELEXA) 20 MG tablet Take 20 mg by mouth daily.    [provider]  ?fluconazole (DIFLUCAN) 200 MG tablet Take 2 tablets (400 mg total) by mouth daily for 14 days. 03/30/21 04/13/21  Vu, Johnny Bridge T, MD  ?furosemide (LASIX) 20 MG tablet TAKE 1 TABLET BY MOUTH EVERY DAY 03/31/21   Bensimhon, Shaune Pascal, MD  ?hydrOXYzine (VISTARIL) 25 MG capsule Take 25 mg by mouth at bedtime as needed (sleep). 02/24/21   [provider]  ?macitentan (OPSUMIT) 10 MG tablet Take 1 tablet (10 mg total) by mouth daily. 01/18/21   Bensimhon, Shaune Pascal, MD  ?Multiple Vitamins-Minerals (MULTIVITAMIN WITH MINERALS) tablet Take 1 tablet by mouth daily.    [provider]  ?omeprazole  (PRILOSEC) 40 MG capsule Take 1 capsule (40 mg total) by mouth 2 (two) times daily. 03/18/21 06/10/21  Sharyn Creamer, MD  ?potassium chloride (KLOR-CON) 10 MEQ tablet TAKE 1 TABLET BY MOUTH EVERY DAY 03/31/21   Bensimhon, Shaune Pascal, MD  ?sildenafil (REVATIO) 20 MG tablet Take 1 tablet (20 mg total) by mouth 3 (three) times daily. 03/28/21   Bensimhon, Shaune Pascal, MD  ? ? ?Past Medical History: ?Past Medical History:  ?Diagnosis Date  ? Acute deep vein thrombosis (DVT) of left peroneal vein (HCC)   ? November 2021  ? Anemia   ? Arthritis   ? CHF (congestive heart failure) ([place])   ? Colon polyps   ? Community acquired pneumonia 11/21/2019  ? Essential hypertension   ?  GAVE (gastric antral vascular ectasia)   ? HLD (hyperlipidemia)   ? Interstitial lung disease ([place])   ? Pneumonia due to COVID-19 virus   ? November 2021  ? Prediabetes   ? Pulmonary hypertension ([place])   ? ? ?Past Surgical History: ?Past Surgical History:  ?Procedure Laterality Date  ? BIOPSY  01/03/2021  ? Procedure: BIOPSY;  Surgeon: Sharyn Creamer, MD;  Location: [facility];  Service: Gastroenterology;;  ? BIOPSY  03/14/2021  ? Procedure: BIOPSY;  Surgeon: Sharyn Creamer, MD;  Location: [facility];  Service: Gastroenterology;;  ? CHOLECYSTECTOMY    ? COLONOSCOPY N/A 12/03/2014  ? Procedure: COLONOSCOPY;  Surgeon: Rogene Houston, MD;  [facility];  Service: Endoscopy;  Laterality: N/A;  830  ? COLONOSCOPY WITH PROPOFOL N/A 03/10/2020  ? Procedure: COLONOSCOPY WITH PROPOFOL;  Surgeon: Rogene Houston, MD;  [facility];  Service: Endoscopy;  Laterality: N/A;  1:15  ? ESOPHAGOGASTRODUODENOSCOPY (EGD) WITH PROPOFOL N/A 01/03/2021  ? Procedure: ESOPHAGOGASTRODUODENOSCOPY (EGD) WITH PROPOFOL;  Surgeon: Sharyn Creamer, MD;  [facility];  Service: Gastroenterology;  Laterality: N/A;  ? ESOPHAGOGASTRODUODENOSCOPY (EGD) WITH PROPOFOL N/A 03/14/2021  ? Procedure: ESOPHAGOGASTRODUODENOSCOPY (EGD) WITH PROPOFOL;  Surgeon: Sharyn Creamer, MD;  [facility];  Service: Gastroenterology;  Laterality: N/A;  ? HEMOSTASIS CLIP PLACEMENT  03/14/2021  ? Procedure: HEMOSTASIS CLIP PLACEMENT;  Surgeon: Sharyn Creamer, MD;  [facility]

## 2021-04-04 NOTE — Progress Notes (Addendum)
Initial Nutrition Assessment ? ?DOCUMENTATION CODES:  ? ?Not applicable ? ?INTERVENTION:  ? ?Ensure Enlive po TID, each supplement provides 350 kcal and 20 grams of protein. ? ?MVI with minerals daily. ? ?NUTRITION DIAGNOSIS:  ? ?Inadequate oral intake related to decreased appetite as evidenced by per patient/family report. ? ?GOAL:  ? ?Patient will meet greater than or equal to 90% of their needs ? ?MONITOR:  ? ?PO intake, Supplement acceptance, Labs ? ?REASON FOR ASSESSMENT:  ? ?Malnutrition Screening Tool ?  ? ?ASSESSMENT:  ? ?76 yo female admitted with worsening SOB r/t acute diastolic CHF. PMH includes PAH, RV failure, pericardial effusion, ILD, anemia, CHF, HTN, GAVE, HLD, prediabetes. ? ?Patient out of room for procedure during RD visit to her room. Spoke with patient's daughter at bedside. Patient has had a poor appetite and reduced intake since November 2021. She lost 20 lbs around that time and has not gained the weight back. She drinks protein shakes at home when she doesn't eat well. She likes [employer] spatial [employer].   ? ?Currently on a heart healthy diet. ?Meal intakes: 75% x 1 meal (breakfast this AM) ? ?Labs reviewed. Mag 1.6 ?Medications reviewed and include Lasix, mag sulfate. ? ?Weight history reviewed. Weight has been stable for the past 3 months.  ? ?NUTRITION - FOCUSED PHYSICAL EXAM: ? ?Unable to complete ? ?Diet Order:   ?Diet Order   ? ?       ?  Diet Heart Room service appropriate? Yes; Fluid consistency: Thin  Diet effective now       ?  ? ?  ?  ? ?  ? ? ?EDUCATION NEEDS:  ? ?No education needs have been identified at this time ? ?Skin:  Skin Assessment: Reviewed RN Assessment ? ?Last BM:  3/18 ? ?Height:  ? ?Ht Readings from Last 1 Encounters:  ?04/03/21 _0  (1.626 m)  ? ? ?Weight:  ? ?Wt Readings from Last 1 Encounters:  ?04/03/21 56.2 kg  ? ? ? ?BMI:  Body mass index is 21.28 kg/m?. ? ?Estimated Nutritional Needs:  ? ?Kcal:  1500-1700 ? ?Protein:  70-85 gm ? ?Fluid:   1.5-1.7 L ? ? ?Lucas Mallow RD, LDN, CNSC ?Please refer to Amion for contact information.                                                       ? ?

## 2021-04-04 NOTE — H&P (Signed)
?History and Physical  ? ? ?Jacqueline Harris:[id number] DOB: 29-Oct-1945 DOA: 04/03/2021 ? ?DOS: the Jacqueline Harris was seen and examined on 04/03/2021 ? ?PCP: Glenda Chroman, MD  ? ?Jacqueline Harris coming from: Home ? ?I have personally briefly reviewed Jacqueline Harris's old medical records in [place] ? ?CC: SOB x 3 weeks ?HPI: ?Jacqueline Harris with hx of PAH, RV failure, known percardial effusion, recent dx'd of ILD, ER with worsening shortness of breath.  Jacqueline Harris's been having more shortness of breath for the last 3 weeks.  Jacqueline Harris was recently seen by Dr. Haroldine Laws with advanced heart failure clinic on March 28, 2021.  At that time Jacqueline Harris his Lasix was doubled for 3 days.  (40 mg daily for 3 days).  Jacqueline Harris was continued on her sildenafil 3 times a day. ? ?Jacqueline Harris states that Jacqueline Harris went home.  Jacqueline Harris has been having increasing shortness of breath.  Jacqueline Harris denies any fever or chills.  Jacqueline Harris is admits to increasing ankle swelling.  Her weight has not gone up. ? ?Jacqueline Harris came to the ER for further evaluation. ? ?On arrival, temp 98.6 heart rate 88 blood pressure 122/46 satting 87% on room air.  Jacqueline Harris is placed on 2 L of oxygen. ? ?COVID and flu negative. ? ?Sodium 138, potassium 4.2, BUN of 30, creatinine 1.25 ? ?BNP 252 ? ?White count 8.2, hemoglobin 8.2, platelets 356, MCV 67 ? ?D-dimer elevated 1.4 ? ?CTA of the chest negative for PE.  Does show adenopathy bilateral hila are nodes, axillary nodes.  There is also a large left pleural effusion.  Moderate right sized pleural effusion.  There is also a pericardial effusion that appears larger. ? ?[facility] hospitalist contacted for admission.  ? ?ED Course: CTA negative for PE. Given IV lasix. ? ?Review of Systems:  ?Review of Systems  ?Constitutional:  Negative for chills and fever.  ?HENT: Negative.    ?Eyes: Negative.   ?Respiratory:  Positive for shortness of breath.   ?Cardiovascular:  Positive for leg swelling and PND.  ?Gastrointestinal: Negative.   ?Genitourinary: Negative.   ?Musculoskeletal: Negative.    ?Skin: Negative.   ?Neurological: Negative.   ?Endo/Heme/Allergies: Negative.   ?Psychiatric/Behavioral: Negative.    ?All other systems reviewed and are negative. ? ?Past Medical History:  ?Diagnosis Date  ? Acute deep vein thrombosis (DVT) of left peroneal vein (HCC)   ? November 2021  ? Anemia   ? Arthritis   ? CHF (congestive heart failure) ([place])   ? Colon polyps   ? Community acquired pneumonia 11/21/2019  ? Essential hypertension   ? GAVE (gastric antral vascular ectasia)   ? HLD (hyperlipidemia)   ? Interstitial lung disease ([place])   ? Pneumonia due to COVID-19 virus   ? November 2021  ? Prediabetes   ? Pulmonary hypertension ([place])   ? ? ?Past Surgical History:  ?Procedure Laterality Date  ? BIOPSY  01/03/2021  ? Procedure: BIOPSY;  Surgeon: Sharyn Creamer, MD;  Location: [facility];  Service: Gastroenterology;;  ? BIOPSY  03/14/2021  ? Procedure: BIOPSY;  Surgeon: Sharyn Creamer, MD;  Location: [facility];  Service: Gastroenterology;;  ? CHOLECYSTECTOMY    ? COLONOSCOPY N/A 12/03/2014  ? Procedure: COLONOSCOPY;  Surgeon: Rogene Houston, MD;  [facility];  Service: Endoscopy;  Laterality: N/A;  830  ? COLONOSCOPY WITH PROPOFOL N/A 03/10/2020  ? Procedure: COLONOSCOPY WITH PROPOFOL;  Surgeon: Rogene Houston, MD;  [facility];  Service: Endoscopy;  Laterality: N/A;  1:15  ? ESOPHAGOGASTRODUODENOSCOPY (EGD) WITH PROPOFOL N/A 01/03/2021  ? Procedure: ESOPHAGOGASTRODUODENOSCOPY (EGD) WITH PROPOFOL;  Surgeon: Sharyn Creamer, MD;  [facility];  Service: Gastroenterology;  Laterality: N/A;  ? ESOPHAGOGASTRODUODENOSCOPY (EGD) WITH PROPOFOL N/A 03/14/2021  ? Procedure: ESOPHAGOGASTRODUODENOSCOPY (EGD) WITH PROPOFOL;  Surgeon: Sharyn Creamer, MD;  [facility];  Service: Gastroenterology;  Laterality: N/A;  ? HEMOSTASIS CLIP PLACEMENT  03/14/2021  ? Procedure: HEMOSTASIS CLIP PLACEMENT;  Surgeon: Sharyn Creamer, MD;  Location: [facility];  Service: Gastroenterology;;  ?  POLYPECTOMY  03/10/2020  ? Procedure: POLYPECTOMY;  Surgeon: Rogene Houston, MD;  [facility];  Service: Endoscopy;;  ? RADIOFREQUENCY ABLATION  01/03/2021  ? Procedure: RADIO FREQUENCY ABLATION;  Surgeon: Sharyn Creamer, MD;  Location: [facility];  Service: Gastroenterology;;  ? RIGHT HEART CATH N/A 04/20/2020  ? Procedure: RIGHT HEART CATH;  Surgeon: Jolaine Artist, MD;  Location: [place] [facility];  Service: Cardiovascular;  Laterality: N/A;  ? RIGHT HEART CATH N/A 12/31/2020  ? Procedure: RIGHT HEART CATH;  Surgeon: Jolaine Artist, MD;  Location: [place] [facility];  Service: Cardiovascular;  Laterality: N/A;  ? ? ? reports that Jacqueline Harris quit smoking about 38 years ago. Her smoking use included cigarettes. Jacqueline Harris has a 6.50 pack-year smoking history. Jacqueline Harris has never used smokeless tobacco. Jacqueline Harris reports that Jacqueline Harris does not drink alcohol and does not use drugs. ? ?Allergies  ?Allergen Reactions  ? Spironolactone Rash  ? ? ?Family History  ?Problem Relation Age of Onset  ? Cancer - Lung Mother   ? Bladder Cancer Father   ? Diabetes Father   ? COPD Father   ? Hypertension Sister   ? Heart disease Sister   ? Other Sister   ?     GAVE  ? Diabetes Sister   ? Cirrhosis Sister   ? Other Sister   ?     GAVE  ? Cancer - Colon Brother   ? Diabetes Brother   ? Heart disease Brother   ? Thyroid disease Son   ? Depression Son   ? Anxiety disorder Son   ? Hyperlipidemia Son   ? ? ?Prior to Admission medications   ?Medication Sig Start Date End Date Taking? Authorizing Provider  ?acetaminophen (TYLENOL) 650 MG CR tablet Take 650 mg by mouth every 8 (eight) hours as needed for pain.    [provider]  ?ALPRAZolam Duanne Moron) 0.5 MG tablet Take 0.5 mg by mouth at bedtime as needed for anxiety.    [provider]  ?citalopram (CELEXA) 20 MG tablet Take 20 mg by mouth daily.    [provider]  ?fluconazole (DIFLUCAN) 200 MG tablet Take 2 tablets (400 mg total) by mouth daily for 14 days. 03/30/21  04/13/21  Vu, Johnny Bridge T, MD  ?furosemide (LASIX) 20 MG tablet TAKE 1 TABLET BY MOUTH EVERY DAY 03/31/21   Bensimhon, Shaune Pascal, MD  ?hydrOXYzine (VISTARIL) 25 MG capsule Take 25 mg by mouth at bedtime as needed (sleep). 02/24/21   [provider]  ?macitentan (OPSUMIT) 10 MG tablet Take 1 tablet (10 mg total) by mouth daily. 01/18/21   Bensimhon, Shaune Pascal, MD  ?Multiple Vitamins-Minerals (MULTIVITAMIN WITH MINERALS) tablet Take 1 tablet by mouth daily.    [provider]  ?omeprazole (PRILOSEC) 40 MG capsule Take 1 capsule (40 mg total) by mouth 2 (two) times daily. 03/18/21 06/10/21  Sharyn Creamer, MD  ?potassium chloride (KLOR-CON) 10 MEQ tablet TAKE 1  TABLET BY MOUTH EVERY DAY 03/31/21   Bensimhon, Shaune Pascal, MD  ?sildenafil (REVATIO) 20 MG tablet Take 1 tablet (20 mg total) by mouth 3 (three) times daily. 03/28/21   Bensimhon, Shaune Pascal, MD  ? ? ?Physical Exam: ?Vitals:  ? 04/04/21 0249 04/04/21 0300 04/04/21 0315 04/04/21 0405  ?BP:  (!) 141/50 (!) 119/51 (!) 136/51  ?Pulse:  84 84 93  ?Resp:  (!) 25 (!) 25 18  ?Temp: 98.8 ?F (37.1 ?C)   98.4 ?F (36.9 ?C)  ?TempSrc: Oral   Oral  ?SpO2:  97% 97% 95%  ?Weight:      ?Height:      ? ? ?Physical Exam ?Vitals and nursing note reviewed.  ?Constitutional:   ?   General: Jacqueline Harris is not in acute distress. ?   Appearance: Jacqueline Harris is not ill-appearing, toxic-appearing or diaphoretic.  ?HENT:  ?   Head: Normocephalic and atraumatic.  ?   Nose: Nose normal. No rhinorrhea.  ?Cardiovascular:  ?   Rate and Rhythm: Normal rate and regular rhythm.  ?Pulmonary:  ?   Breath sounds: No wheezing.  ?   Comments: Bedside thoracic ultrasound shows bilateral pleural effusions.  Left greater than right.  Both effusions are free-flowing.  No apparent loculations.  Left-sided effusion appears approximately 500-750 milliliters in volume ?Abdominal:  ?   General: Bowel sounds are normal. There is no distension.  ?   Tenderness: There is no abdominal tenderness. There is no guarding.   ?Musculoskeletal:  ?   Right lower leg: 1+ Edema present.  ?   Left lower leg: 1+ Edema present.  ?Skin: ?   General: Skin is warm and dry.  ?   Capillary Refill: Capillary refill takes less than 2 seconds.  ?Neuro

## 2021-04-04 NOTE — Assessment & Plan Note (Addendum)
-  Continue sildenafil, macitentan.  ?- CTD work up only +ANA in past, positive again this admit with +anti-Ro and anti-La Ab's as well. Consider rheumatology referral. ?

## 2021-04-04 NOTE — Progress Notes (Signed)
Plan of Care Note for accepted transfer ? ? ?Patient: Jacqueline Harris MRN: [id number]   [place]: 04/03/2021 ? ?Facility requesting transfer: [facility] ?Requesting Provider: Dr. Dina Rich ?Reason for transfer: Decompensated CHF ?Facility course: Patient is a 76 year old female with a past medical history of pulmonary artery hypertension, pericardial effusion, chronic diastolic CHF followed by Dr. Haroldine Laws.  Presented to the ED with complaints of worsening shortness of breath x1 week.  Hypoxic to the high 80s on room air and placed on 2 L supplemental oxygen.  BNP slightly elevated.  CT angiogram chest negative for PE but showing moderate to large left and moderate right pleural effusions.  Also showing pulmonary edema and moderate pericardial effusion.  Not hypotensive or tachycardic.  Patient was given IV Lasix 80 mg.  ED physician spoke to Dr. Haroldine Laws, cardiology will consult in the morning. ? ?Plan of care: ?The patient is accepted for admission to Telemetry unit, at [facility]..  ? ?Author: ?Shela Leff, MD ?04/04/2021 ? ?Check [email or web address] for on-call coverage. ? ?Nursing staff, Please call [place] number on Amion as soon as patient's arrival, so appropriate admitting provider can evaluate the pt. ?

## 2021-04-04 NOTE — Subjective & Objective (Signed)
CC: SOB x 3 weeks ?HPI: ?76 yo WF with hx of PAH, RV failure, known percardial effusion, recent dx'd of ILD, ER with worsening shortness of breath.  Patient's been having more shortness of breath for the last 3 weeks.  She was recently seen by Dr. Haroldine Laws with advanced heart failure clinic on March 28, 2021.  At that time patient his Lasix was doubled for 3 days.  (40 mg daily for 3 days).  She was continued on her sildenafil 3 times a day. ? ?Patient states that she went home.  She has been having increasing shortness of breath.  She denies any fever or chills.  She is admits to increasing ankle swelling.  Her weight has not gone up. ? ?Patient came to the ER for further evaluation. ? ?On arrival, temp 98.6 heart rate 88 blood pressure 122/46 satting 87% on room air.  She is placed on 2 L of oxygen. ? ?COVID and flu negative. ? ?Sodium 138, potassium 4.2, BUN of 30, creatinine 1.25 ? ?BNP 252 ? ?White count 8.2, hemoglobin 8.2, platelets 356, MCV 67 ? ?D-dimer elevated 1.4 ? ?CTA of the chest negative for PE.  Does show adenopathy bilateral hila are nodes, axillary nodes.  There is also a large left pleural effusion.  Moderate right sized pleural effusion.  There is also a pericardial effusion that appears larger. ? ?[facility] hospitalist contacted for admission. ?

## 2021-04-04 NOTE — Assessment & Plan Note (Addendum)
Due to [facility] and acute on chronic HFpEF with pleural effusions.  ?- Continue supplemental oxygen at discharge. Pt is optimized for discharge at this time and will continue following up with pulmonary and cardiology. ?

## 2021-04-04 NOTE — Assessment & Plan Note (Addendum)
Recommend recheck at follow up. Hold diuretic and potassium supplementation for now. ?

## 2021-04-04 NOTE — H&P (View-Only) (Signed)
?  ?Advanced Heart Failure Team Consult Note ? ? ?Primary Physician: Vyas, Dhruv B, MD ?PCP-Cardiologist:  Samuel McDowell, MD ?HF: Dr. Bensimhon ? ?Reason for Consultation: PAH/Acute on chronic diastolic CHF with RV failure, pericardial effusion ? ?HPI:   ? ?Jacqueline Harris is seen today for evaluation of PAH/acute on chronic diastolic CHF with RV failure and pericardial effusion at the request of Dr. Chen with [facility].  ? ?75 y.o. female with h/o HTN, tobacco use  referred by Dr. McDowell for further evaluation of pulmonary HTN.  ?  ?History includes COVID-19 in 9/21 (unvaccinated) than admitted to [facility] in 11/21 with PNA. Cardiology was involved in her care at that time with finding of a small pericardial effusion, also moderate RV dysfunction with severely elevated estimated RVSP of 68 mmHg.LVEF was normal.  It was suspected that this was related to acute pulmonary process at that point.  She also had an acute left peroneal vein DVT, treated with Eliquis.  Chest CTA at that time showed no pulmonary embolus. ?  ?She saw Michelle Lenze in f/u. Echo 03/31/20 EF 60-65% Mildly dilated RV with mild RV dysfunction. Moderate TR. RVSP 94mmHG. Moderate pericardial effusion  ?  ?Had carotid u/s with Dr. Vyas bilateral 40-46% stenosis  ?  ?Echo 12/15/20 EF 60-65% Mild LVH G1DD. D-shaped septum RV dilated. Mild to moderately HK. RVSP 78mmHG. Large pericardial effusion (no tamponade) ?  ?Smoked for about 13 years < 1ppd in her 20s. Worked at a desk. No environmental exposures. No personal or family h/o CTD. Does have Raynaud's.  ?  ?RHC in 4/22 ?RA = 4 ?RV = 67/4 ?PA = 68/22 (41) ?PCW = 3 ?Fick cardiac output/index = 7.3/4.4 ?Thermo CO/CI = 5.9/3.5 ?PVR = 5.2 (Fick) 6.5 (Thermo) ?Ao sat = 99% ?PA sat = 71%, 74% ?High SVC sat = 73% ?  ?Selective lower lobe R & L pulmonary angios: Mild distal pruning no evidence of clot ?  ?Has been on sildenafil 20 tid. In 9/22 started on macitentan but stopped after two weeks due to bloating and  worsening SOB. Also being treated for acute Fe-def anemia at the time. Has been following with GI (Dr. Dorsey).  ?  ?EGD 12/22: showed fungal esophagitis and gastric AVMs. ?  ?RHC 12/22  ?RA = 11 ?RV = 82/11 ?PA = 85/28 (48) ?PCW = 14 ?Fick cardiac output/index = 7.3/4.6 ?PVR = 4.6 WU ?Ao sat = 91% ?PA sat = 68%, 69% ?PaPI = 6.5 ?  ?PFTs 12/22 ?FEV 1.18 (54%) ?FVC 1.50 (51%) ?DLCO 45% ? ?In 12/22 we increased sildenafil to 60 tid. After RHC we added back macitentan. She is tolerating that but stopped sildenafil due to blurry vision which got better after 2-3 weeks of stopping sildenafil.  ? ?Seen for follow-up 03/28/21. Noted O2 sats dropping 91%> 83% since EGD in 02/23. Appeared volume overloaded. Furosemide increased to 40 mg daily X 3 days (takes 20 mg daily). Sildenafil added back. Echo planned to reassess RV and pericardial effusion. ? ?Presented to [facility] ED yesterday evening with worsening dyspnea and hypoxia, O2 down to 83% on RA on RA. No improvement after increasing diuretics a few days. Notes increased leg edema, no orthopnea or PND. Denies hemathohezia or melena. Labs significant for BNP 252, Ddimer 1.40, Scr 1.25, Hgb 8.2, MCV 67.7, platelet 356. SR on ECG. CTA with no evidence of PE, but did show moderate to large left and moderate right sided pleural effusions, pulmonary edema, moderate pericardial effusion, left paraesophageal soft   tissue attenuation. She was given 80 mg lasix IV and duoneb, and subsequently transferred to [facility] for admission. She was admitted under Hospitalist service. Thoracentesis has been arranged.  ? ?Awaiting echo. ? ?Lives alone. Has been independent in ADLs. Daughter has needed to assist her the last few weeks d/t increasing weakness and dyspnea. ? ?Review of Systems: [y] = yes, _0  = no  ? ?General: Weight gain _1 ; Weight loss _2 ; Anorexia _3 ; Fatigue [Y]; Fever _4 ; Chills _5 ; Weakness _6   ?Cardiac: Chest pain/pressure _7 ; Resting SOB [Y]; Exertional SOB [Y];  Orthopnea _8 ; Pedal Edema [Y]; Palpitations _9 ; Syncope _10 ; Presyncope _11 ; Paroxysmal nocturnal dyspnea_12   ?Pulmonary: Cough _13 ; Wheezing_14 ; Hemoptysis_15 ; Sputum _16 ; Snoring _17   ?GI: Vomiting_18 ; Dysphagia_19 ; Melena_20 ; Hematochezia _21 ; Heartburn_22 ; Abdominal pain _23 ; Constipation _24 ; Diarrhea _25 ; BRBPR _26   ?GU: Hematuria_27 ; Dysuria _28 ; Nocturia_29   ?Vascular: Pain in legs with walking _30 ; Pain in feet with lying flat _31 ; Non-healing sores _32 ; Stroke _33 ; TIA _34 ; Slurred speech _35 ;  ?Neuro: Headaches_36 ; Vertigo_37 ; Seizures_38 ; Paresthesias_39 ;Blurred vision _40 ; Diplopia _41 ; Vision changes _42   ?Ortho/Skin: Arthritis _43 ; Joint pain _44 ; Muscle pain _45 ; Joint swelling _46 ; Back Pain _47 ; Rash _48   ?Psych: Depression_49 ; Anxiety_50   ?Heme: Bleeding problems _51 ; Clotting disorders _52 ; Anemia [Y]  ?Endocrine: Diabetes _53 ; Thyroid dysfunction_54  ? ?Home Medications ?Prior to Admission medications   ?Medication Sig Start Date End Date Taking? Authorizing Provider  ?acetaminophen (TYLENOL) 650 MG CR tablet Take 650 mg by mouth every 8 (eight) hours as needed for pain.    [provider]  ?ALPRAZolam Duanne Moron) 0.5 MG tablet Take 0.5 mg by mouth at bedtime as needed for anxiety.    [provider]  ?citalopram (CELEXA) 20 MG tablet Take 20 mg by mouth daily.    [provider]  ?fluconazole (DIFLUCAN) 200 MG tablet Take 2 tablets (400 mg total) by mouth daily for 14 days. 03/30/21 04/13/21  Vu, Johnny Bridge T, MD  ?furosemide (LASIX) 20 MG tablet TAKE 1 TABLET BY MOUTH EVERY DAY 03/31/21   Bensimhon, Shaune Pascal, MD  ?hydrOXYzine (VISTARIL) 25 MG capsule Take 25 mg by mouth at bedtime as needed (sleep). 02/24/21   [provider]  ?macitentan (OPSUMIT) 10 MG tablet Take 1 tablet (10 mg total) by mouth daily. 01/18/21   Bensimhon, Shaune Pascal, MD  ?Multiple Vitamins-Minerals (MULTIVITAMIN WITH MINERALS) tablet Take 1 tablet by mouth daily.    [provider]  ?omeprazole  (PRILOSEC) 40 MG capsule Take 1 capsule (40 mg total) by mouth 2 (two) times daily. 03/18/21 06/10/21  Sharyn Creamer, MD  ?potassium chloride (KLOR-CON) 10 MEQ tablet TAKE 1 TABLET BY MOUTH EVERY DAY 03/31/21   Bensimhon, Shaune Pascal, MD  ?sildenafil (REVATIO) 20 MG tablet Take 1 tablet (20 mg total) by mouth 3 (three) times daily. 03/28/21   Bensimhon, Shaune Pascal, MD  ? ? ?Past Medical History: ?Past Medical History:  ?Diagnosis Date  ? Acute deep vein thrombosis (DVT) of left peroneal vein (HCC)   ? November 2021  ? Anemia   ? Arthritis   ? CHF (congestive heart failure) ([place])   ? Colon polyps   ? Community acquired pneumonia 11/21/2019  ? Essential hypertension   ?  GAVE (gastric antral vascular ectasia)   ? HLD (hyperlipidemia)   ? Interstitial lung disease ([place])   ? Pneumonia due to COVID-19 virus   ? November 2021  ? Prediabetes   ? Pulmonary hypertension ([place])   ? ? ?Past Surgical History: ?Past Surgical History:  ?Procedure Laterality Date  ? BIOPSY  01/03/2021  ? Procedure: BIOPSY;  Surgeon: Sharyn Creamer, MD;  Location: [facility];  Service: Gastroenterology;;  ? BIOPSY  03/14/2021  ? Procedure: BIOPSY;  Surgeon: Sharyn Creamer, MD;  Location: [facility];  Service: Gastroenterology;;  ? CHOLECYSTECTOMY    ? COLONOSCOPY N/A 12/03/2014  ? Procedure: COLONOSCOPY;  Surgeon: Rogene Houston, MD;  [facility];  Service: Endoscopy;  Laterality: N/A;  830  ? COLONOSCOPY WITH PROPOFOL N/A 03/10/2020  ? Procedure: COLONOSCOPY WITH PROPOFOL;  Surgeon: Rogene Houston, MD;  [facility];  Service: Endoscopy;  Laterality: N/A;  1:15  ? ESOPHAGOGASTRODUODENOSCOPY (EGD) WITH PROPOFOL N/A 01/03/2021  ? Procedure: ESOPHAGOGASTRODUODENOSCOPY (EGD) WITH PROPOFOL;  Surgeon: Sharyn Creamer, MD;  Location: [facility];  Service: Gastroenterology;  Laterality: N/A;  ? ESOPHAGOGASTRODUODENOSCOPY (EGD) WITH PROPOFOL N/A 03/14/2021  ? Procedure: ESOPHAGOGASTRODUODENOSCOPY (EGD) WITH PROPOFOL;  Surgeon: Sharyn Creamer, MD;  Location: [facility];  Service: Gastroenterology;  Laterality: N/A;  ? HEMOSTASIS CLIP PLACEMENT  03/14/2021  ? Procedure: HEMOSTASIS CLIP PLACEMENT;  Surgeon: Sharyn Creamer, MD;  Location: [facility]

## 2021-04-04 NOTE — Procedures (Signed)
Ultrasound-guided diagnostic and therapeutic left sided thoracentesis performed yielding 850 milliliters of straw colored fluid. No immediate complications.   Diagnostic fluid was sent to the lab for further analysis. Follow-up chest x-ray pending. EBL is < 2 ml.     ? ?

## 2021-04-04 NOTE — Progress Notes (Signed)
PROGRESS NOTE ? ?Brief Narrative: ?Jacqueline Harris is a 76 y.o. female with a history of right heart failure, PAH, ILD, pericardial effusion who presented to the ED 3/19 with worsening shortness of breath despite increasing lasix as recommended by Dr. Haroldine Laws. She was hypoxic, afebrile, with elevated BNP (252), normal WBC (8.2k), elevated d-dimer (1.4) with subsequent CTA chest showing no PE. There is shotty lymphadenopathy, large left, and moderate right, pleural effusions that appeared to be free-flowing and an enlarged pericardial effusion. BP 122/46, HR 88. IV lasix was given, cardiology consulted, and the patient admitted early this morning by Dr. Bridgett Larsson. ? ?Subjective: ?Still feels diffusely very weak. Hgb has been down of late, though she's having no bleeding. Says she was told she needed IV iron but hasn't gotten it. No allergies to iron. Has no chest pain, feels essentially unchanged at this point (a couple hours after admission).  ? ?Objective: ?BP (!) 125/46 (BP Location: Right Arm)   Pulse 93   Temp 97.9 ?F (36.6 ?C) (Oral)   Resp 16   Ht _0  (1.626 m)   Wt 56.2 kg   SpO2 97%   BMI 21.28 kg/m?   ?Gen: Elderly, frail, pleasant in no distress ?Pulm: Diminished with crackles bilaterally, nonlabored  ?CV: RRR, Soft S2 but no rub, +JVD, no edema ?GI: Soft, NT, ND, +BS  ?Neuro: Alert and oriented. No focal deficits. ?Skin: No rashes, lesions or ulcers ? ?Assessment & Plan: ?[name] is a 76yo female admitted this morning for acute hypoxic respiratory failure due to bilateral L > R pleural effusions, acute HFpEF complicating chronic pulmonary HTN/RV failure, also noted to have enlarged pericardial effusion.  ?- Echocardiogram pending. Pt has JVD but no evidence of tamponade at this point. Needs very close monitoring.  ?- Confirmed cardiology has been consulted. IV lasix given last night, will defer additional dosing to HF team.  ?- Continue supplemental oxygen, planning to wean with improvement.   ?- Hgb is low at 8.2, has been lower in the past, has required transfusions in the past. Work up by EGD most recently 03/14/2021 by Dr. Lorenso Courier revealed improving esophagitis and GAVE without active bleeding. PPI and NSAID avoidance were recommended which we will continue here. Monitor CBC. Iron stores severely low (ferritin 9, 4% sat, iron 13, TIBC 340). Will give IV iron to which patient consents. ? ?Jacqueline Pour, MD ?Pager on amion ?04/04/2021, 9:41 AM   ?

## 2021-04-04 NOTE — Assessment & Plan Note (Addendum)
Cardiology guiding diuresis, holding further lasix today, can restart tomorrow based on improved pressures at [place] this AM. ?

## 2021-04-04 NOTE — Assessment & Plan Note (Addendum)
Echo reveals smaller effusion, no tamponade at this time. No drainage recommended. ?

## 2021-04-04 NOTE — Assessment & Plan Note (Addendum)
Followed by Dr. Elsworth Soho as outpatient.  ?- PCCM consulted here. ?

## 2021-04-04 NOTE — Assessment & Plan Note (Addendum)
Cardiology assessment appreciated. See RHC findings copied below:  ?RA = 6 ?RV = 61/10 ?PA = 63/16 (37) ?PCW = 9 ?Fick cardiac output/index = 9.1/5.7 ?PVR = 3.1 WU ?FA sat = 95% ?PA sat = 75%, 76% ?SVC sat = 79%  ?PAPi = 7.8  ?

## 2021-04-04 NOTE — Assessment & Plan Note (Addendum)
-  Defer further evaluation to pulmonary team. ?

## 2021-04-04 NOTE — Progress Notes (Signed)
Pharmacy Consult for Pulmonary Hypertension Treatment  ? ?Indication - Continuation of prior to admission medication  ? ?Patient is 76 y.o.  with history of PAH on chronic Macitentan (Opsumit) PTA and will be continued while hospitalized.  ? ?Continuing this medication order as an inpatient requires that monitoring parameters per REMS requirements must be met. ? ?Chronic therapy is under the supervision of Cyrus who is enrolled in the REMS program and is being notified of continuation of therapy. A staff message in EPIC has been sent notifying the certified prescriber.  ?Per patient report has previously been educated on Pregnancy Risk and Hepatotoxicity . On admission pregnancy risk has been assessed and no monitoring required. ?Hepatic function has been evaluated. AST/ALT appropriate to continue medication at this time. ? ?Hepatic Function Latest Ref Rng & Units 04/03/2021 12/27/2020 04/07/2020  ?Total Protein 6.5 - 8.1 g/dL 6.6 7.0 7.2  ?Albumin 3.5 - 5.0 g/dL 3.1(L) 2.6(L) 2.8(L)  ?AST 15 - 41 U/L 10(L) 15 18  ?ALT 0 - 44 U/L _0 ?Alk Phosphatase 38 - 126 U/L 60 54 61  ?Total Bilirubin 0.3 - 1.2 mg/dL 0.6 0.6 0.2(L)  ?Bilirubin, Direct 0.00 - 0.40 mg/dL - - -  ? ? ?If any question arise or pregnancy is identified during hospitalization, contact for bosentan and macitentan: [phone]; ambrisentan: [phone]. ? ?Thank for you allowing [place] to participate in the care of this patient.  ? ?Erin Hearing PharmD., BCPS ?Clinical Pharmacist ?04/04/2021 9:12 AM ? ?

## 2021-04-04 NOTE — Assessment & Plan Note (Addendum)
s/p thoracentesis on left concerning for exudate w/negative GS, culture and cytology.  ?- Hypoxic at this time. If continues to be hypoxic, may consider right-side thoracentesis for Dx and Tx purposes.  ?

## 2021-04-04 NOTE — Progress Notes (Signed)
Heart Failure Navigator Progress Note ° °Assessed for Heart & Vascular TOC clinic readiness.  °Patient does not meet criteria due to AHF rounding team consulted this hospitalization.  ° °Navigator available for educational resources. ° °Jere Bostrom, MSN, RN °Heart Failure Nurse Navigator °[phone] ° ° °

## 2021-04-04 NOTE — Consult Note (Signed)
? ?NAME:  Jacqueline Harris, MRN:  [id number], DOB:  05/31/1945, LOS: 0 ?ADMISSION DATE:  04/03/2021, CONSULTATION DATE:  04/04/2021 ?REFERRING MD:  Dr. Bonner Puna, CHIEF COMPLAINT: Assistance in management of ILD and pulmonary artery hypertension ? ?History of Present Illness:  ?Jacqueline Harris is a 76 year old female with a past medical history significant for interstitial lung disease, pulmonary artery hypertension, congestive heart failure, hypertension, prior DVT, prediabetes, and anemia who presented to the ED 3/19 for complaints of progressive shortness of breath.  Per patient shortness of breath has progressively worsened over the last 3 weeks she has been seen by primary cardiologist Dr. Haroldine Laws with advanced heart failure at which time home diuretic was doubled with little improvement in shortness of breath seen.  CTA chest obtained and negative for PE but did show large left pleural effusion with moderate right pleural effusion as well, pericardial effusion was also seen ? ?Mid morning 3/20 pulmonary consulted for assistance in management of initial lung disease and pulmonary artery hypertension.  Patient also was seen with ? ?Pertinent  Medical History  ?Interstitial lung disease, pulmonary artery hypertension, congestive heart failure, hypertension, prior DVT, prediabetes, and anemia ? ?Significant Hospital Events: ?Including procedures, antibiotic start and stop dates in addition to other pertinent events   ?3/19 noted with progressive or worsening shortness of breath x3 weeks despite increase of home diuretics ?3/20 PCCM consulted, IR preformed left thoracentesis with 866ms removed  ? ?Interim History / Subjective:  ?As above  ? ?Objective   ?Blood pressure (!) 121/51, pulse 94, temperature 97.9 ?F (36.6 ?C), temperature source Oral, resp. rate 20, height 5' 4" (1.626 m), weight 56.2 kg, SpO2 96 %. ?   ?FiO2 (%):  [21 %-26 %] 21 %  ? ?Intake/Output Summary (Last 24 hours) at 04/04/2021 1419 ?Last data filed at  04/04/2021 1050 ?Gross per 24 hour  ?Intake 840 ml  ?Output 400 ml  ?Net 440 ml  ? ?Filed Weights  ? 04/03/21 1855  ?Weight: 56.2 kg  ? ? ?Examination: ?General: Thin acute on chronically ill appearing elderly female lying in bed in NAD ?HEENT: [place]/ATT, MM pink/moist, PERRL,  ?Neuro: Alert and oriented x3, non-focal  ?CV: s1s2 regular rate and rhythm, no murmur, rubs, or gallops,  ?PULM:  Clear to ascultation, no increased work of breathing, no added breath sounds on 2L [place] ?GI: soft, bowel sounds active in all 4 quadrants, non-tender, non-distended, tolerating oral diet ?Extremities: warm/dry, 1+ pitting edema  ?Skin: no rashes or lesions ? ?Resolved Hospital Problem list   ? ? ?Assessment & Plan:  ?History of interstitial lung disease ?Pulmonary artery hypertension ?-Right heart cath December 2022 12/22 RA 11 PA 85/28 (48) PCW = 14 Fick 7.3/4.6 PVR = 4.6 WU ?-Repeat echocardiogram 3/20 revealed EF 50 to 55% with severely elevated pulmonary artery systolic pressure, mild mitral valve regurgitation, tricuspid valve regurgitation is mild to moderate ?-Macitentan resumed after right heart cath September 2021 but stopped after 2 weeks due to volume overload and iron deficiency anemia again resumed in December 2022 ?Bilateral pleural effusions left greater than right ?Acute hypoxic respiratory failure ?Hilar adenopathy with long history of tobacco abuse ?-13-year smoking history with greater than 1 pack/day ?P: ?Heart failure following, appreciate assistance ?Sildenafil resumed per cardiology at most recent visit ?Defer to cardiology regarding need for repeat heart cath ?IR preformed left thora with 8559m removed, follow up on fluids studies  ?Will discuss with attending need for [facility]  ?Wean supplemental oxygen as  able  ?Mobilize as able  ?Aggressive diuresing as tolerated  ?Close monitoring of renal function  ? ? ?Best Practice (right click and "Reselect all SmartList Selections" daily)   ?Per primary  ? ?Labs   ?CBC: ?Recent Labs  ?Lab 04/03/21 ?1930 04/04/21 ?0840  ?WBC 8.2 5.8  ?NEUTROABS  --  3.8  ?HGB 8.2* 7.4*  ?HCT 28.1* 25.3*  ?MCV 67.7* 67.1*  ?PLT 356 308  ? ? ?Basic Metabolic Panel: ?Recent Labs  ?Lab 04/03/21 ?1930 04/04/21 ?0840  ?NA 138 137  ?K 4.2 3.8  ?CL 103 102  ?CO2 25 25  ?GLUCOSE 132* 109*  ?BUN 30* 23  ?CREATININE 1.25* 1.30*  ?CALCIUM 8.6* 8.3*  ?MG  --  1.6*  ? ?GFR: ?Estimated Creatinine Clearance: 33.3 mL/min (A) (by C-G formula based on SCr of 1.3 mg/dL (H)). ?Recent Labs  ?Lab 04/03/21 ?1930 04/04/21 ?0840  ?WBC 8.2 5.8  ? ? ?Liver Function Tests: ?Recent Labs  ?Lab 04/03/21 ?1930 04/04/21 ?0840  ?AST 10* 13*  ?ALT 6 9  ?ALKPHOS 60 51  ?BILITOT 0.6 0.7  ?PROT 6.6 5.6*  ?ALBUMIN 3.1* 2.3*  ? ?No results for input(s): LIPASE, AMYLASE in the last 168 hours. ?No results for input(s): AMMONIA in the last 168 hours. ? ?ABG ?   ?Component Value Date/Time  ? HCO3 22.7 12/31/2020 1417  ? TCO2 24 12/31/2020 1417  ? ACIDBASEDEF 2.0 12/31/2020 1417  ? O2SAT 76.0 12/31/2020 1417  ?  ? ?Coagulation Profile: ?No results for input(s): INR, PROTIME in the last 168 hours. ? ?Cardiac Enzymes: ?No results for input(s): CKTOTAL, CKMB, CKMBINDEX, TROPONINI in the last 168 hours. ? ?HbA1C: ?Hgb A1c MFr Bld  ?Date/Time Value Ref Range Status  ?03/31/2020 12:34 PM 6.2 (H) 4.8 - 5.6 % Final  ?  Comment:  ?           Prediabetes: 5.7 - 6.4 ?         Diabetes: >6.4 ?         Glycemic control for adults with diabetes: <7.0 ?  ?11/22/2019 05:07 AM 6.1 (H) 4.8 - 5.6 % Final  ?  Comment:  ?  (NOTE) ?Pre diabetes:          5.7%-6.4% ? ?Diabetes:              >6.4% ? ?Glycemic control for   <7.0% ?adults with diabetes ?  ? ? ?CBG: ?No results for input(s): GLUCAP in the last 168 hours. ? ?Review of Systems:   ?Please see the history of present illness. All other systems reviewed and are negative  ? ?Past Medical History:  ?She,  has a past medical history of Acute deep vein thrombosis (DVT) of left  peroneal vein ([place]), Anemia, Arthritis, CHF (congestive heart failure) ([place]), Colon polyps, Community acquired pneumonia (11/21/2019), Essential hypertension, GAVE (gastric antral vascular ectasia), HLD (hyperlipidemia), Interstitial lung disease ([place]), Pneumonia due to COVID-19 virus, Prediabetes, and Pulmonary hypertension ([place]).  ? ?Surgical History:  ? ?Past Surgical History:  ?Procedure Laterality Date  ? BIOPSY  01/03/2021  ? Procedure: BIOPSY;  Surgeon: Sharyn Creamer, MD;  Location: [facility];  Service: Gastroenterology;;  ? BIOPSY  03/14/2021  ? Procedure: BIOPSY;  Surgeon: Sharyn Creamer, MD;  Location: [facility];  Service: Gastroenterology;;  ? CHOLECYSTECTOMY    ? COLONOSCOPY N/A 12/03/2014  ? Procedure: COLONOSCOPY;  Surgeon: Rogene Houston, MD;  [facility];  Service: Endoscopy;  Laterality: N/A;  830  ? COLONOSCOPY WITH  PROPOFOL N/A 03/10/2020  ? Procedure: COLONOSCOPY WITH PROPOFOL;  Surgeon: Rogene Houston, MD;  [facility];  Service: Endoscopy;  Laterality: N/A;  1:15  ? ESOPHAGOGASTRODUODENOSCOPY (EGD) WITH PROPOFOL N/A 01/03/2021  ? Procedure: ESOPHAGOGASTRODUODENOSCOPY (EGD) WITH PROPOFOL;  Surgeon: Sharyn Creamer, MD;  [facility];  Service: Gastroenterology;  Laterality: N/A;  ? ESOPHAGOGASTRODUODENOSCOPY (EGD) WITH PROPOFOL N/A 03/14/2021  ? Procedure: ESOPHAGOGASTRODUODENOSCOPY (EGD) WITH PROPOFOL;  Surgeon: Sharyn Creamer, MD;  Location: [facility];  Service: Gastroenterology;  Laterality: N/A;  ? HEMOSTASIS CLIP PLACEMENT  03/14/2021  ? Procedure: HEMOSTASIS CLIP PLACEMENT;  Surgeon: Sharyn Creamer, MD;  Location: [facility];  Service: Gastroenterology;;  ? POLYPECTOMY  03/10/2020  ? Procedure: POLYPECTOMY;  Surgeon: Rogene Houston, MD;  [facility];  Service: Endoscopy;;  ? RADIOFREQUENCY ABLATION  01/03/2021  ? Procedure: RADIO FREQUENCY ABLATION;  Surgeon: Sharyn Creamer, MD;  Location: [facility];  Service: Gastroenterology;;  ?  RIGHT HEART CATH N/A 04/20/2020  ? Procedure: RIGHT HEART CATH;  Surgeon: Jolaine Artist, MD;  Location: [facility];  Service: Cardiovascular;  Laterality: N/A;  ? RIGHT HEART CATH N/A 12/31/2020

## 2021-04-05 ENCOUNTER — Ambulatory Visit (INDEPENDENT_AMBULATORY_CARE_PROVIDER_SITE_OTHER): Payer: Medicare HMO | Admitting: Internal Medicine

## 2021-04-05 ENCOUNTER — Ambulatory Visit (HOSPITAL_COMMUNITY): Payer: Medicare HMO

## 2021-04-05 ENCOUNTER — Encounter (HOSPITAL_COMMUNITY): Payer: Self-pay

## 2021-04-05 DIAGNOSIS — N179 Acute kidney failure, unspecified: Secondary | ICD-10-CM | POA: Diagnosis not present

## 2021-04-05 DIAGNOSIS — K209 Esophagitis, unspecified without bleeding: Secondary | ICD-10-CM | POA: Diagnosis not present

## 2021-04-05 DIAGNOSIS — K31819 Angiodysplasia of stomach and duodenum without bleeding: Secondary | ICD-10-CM

## 2021-04-05 DIAGNOSIS — J9601 Acute respiratory failure with hypoxia: Secondary | ICD-10-CM | POA: Diagnosis not present

## 2021-04-05 DIAGNOSIS — I5031 Acute diastolic (congestive) heart failure: Secondary | ICD-10-CM | POA: Diagnosis not present

## 2021-04-05 DIAGNOSIS — D509 Iron deficiency anemia, unspecified: Secondary | ICD-10-CM

## 2021-04-05 DIAGNOSIS — R062 Wheezing: Secondary | ICD-10-CM

## 2021-04-05 LAB — CBC
HCT: 29.1 % — ABNORMAL LOW (ref 36.0–46.0)
Hemoglobin: 9.1 g/dL — ABNORMAL LOW (ref 12.0–15.0)
MCH: 21 pg — ABNORMAL LOW (ref 26.0–34.0)
MCHC: 31.3 g/dL (ref 30.0–36.0)
MCV: 67.2 fL — ABNORMAL LOW (ref 80.0–100.0)
Platelets: 333 10*3/uL (ref 150–400)
RBC: 4.33 MIL/uL (ref 3.87–5.11)
RDW: 20.9 % — ABNORMAL HIGH (ref 11.5–15.5)
WBC: 8.3 10*3/uL (ref 4.0–10.5)
nRBC: 0 % (ref 0.0–0.2)

## 2021-04-05 LAB — BASIC METABOLIC PANEL
Anion gap: 9 (ref 5–15)
BUN: 24 mg/dL — ABNORMAL HIGH (ref 8–23)
CO2: 25 mmol/L (ref 22–32)
Calcium: 8.1 mg/dL — ABNORMAL LOW (ref 8.9–10.3)
Chloride: 99 mmol/L (ref 98–111)
Creatinine, Ser: 1.42 mg/dL — ABNORMAL HIGH (ref 0.44–1.00)
GFR, Estimated: 39 mL/min — ABNORMAL LOW (ref >60)
Glucose, Bld: 138 mg/dL — ABNORMAL HIGH (ref 70–99)
Potassium: 4 mmol/L (ref 3.5–5.1)
Sodium: 133 mmol/L — ABNORMAL LOW (ref 135–145)

## 2021-04-05 LAB — BRAIN NATRIURETIC PEPTIDE: B Natriuretic Peptide: 231.6 pg/mL — ABNORMAL HIGH (ref 0.0–100.0)

## 2021-04-05 LAB — BPAM RBC
Blood Product Expiration Date: 202303242359
ISSUE DATE / TIME: 202303201636
Unit Type and Rh: 9500

## 2021-04-05 LAB — ENA+DNA/DS+ANTICH+CENTRO+JO...
Anti JO-1: <0.2 AI (ref 0.0–0.9)
Centromere Ab Screen: <0.2 AI (ref 0.0–0.9)
Chromatin Ab SerPl-aCnc: 0.9 AI (ref 0.0–0.9)
ENA SM Ab Ser-aCnc: <0.2 AI (ref 0.0–0.9)
Ribonucleic Protein: 0.8 AI (ref 0.0–0.9)
SSA (Ro) (ENA) Antibody, IgG: >8 AI — ABNORMAL HIGH (ref 0.0–0.9)
SSB (La) (ENA) Antibody, IgG: 5.5 AI — ABNORMAL HIGH (ref 0.0–0.9)
Scleroderma (Scl-70) (ENA) Antibody, IgG: 0.7 AI (ref 0.0–0.9)
ds DNA Ab: <1 IU/mL (ref 0–9)

## 2021-04-05 LAB — GRAM STAIN

## 2021-04-05 LAB — MAGNESIUM: Magnesium: 2.7 mg/dL — ABNORMAL HIGH (ref 1.7–2.4)

## 2021-04-05 LAB — TYPE AND SCREEN
ABO/RH(D): O NEG
Antibody Screen: NEGATIVE
Unit division: 0

## 2021-04-05 LAB — ANCA TITERS
Atypical P-ANCA titer: <1:20 {titer}
C-ANCA: <1:20 {titer}
P-ANCA: <1:20 {titer}

## 2021-04-05 LAB — ANA W/REFLEX IF POSITIVE: Anti Nuclear Antibody (ANA): POSITIVE — AB

## 2021-04-05 LAB — ANTI-SCLERODERMA ANTIBODY: Scleroderma (Scl-70) (ENA) Antibody, IgG: 0.6 AI (ref 0.0–0.9)

## 2021-04-05 LAB — CYTOLOGY - NON PAP

## 2021-04-05 LAB — RHEUMATOID FACTOR: Rheumatoid fact SerPl-aCnc: 10.6 IU/mL (ref <14.0)

## 2021-04-05 MED ORDER — FUROSEMIDE 10 MG/ML IJ SOLN
80.0000 mg | Freq: Two times a day (BID) | INTRAMUSCULAR | Status: DC
  Administered 2021-04-05: 80 mg via INTRAVENOUS
  Filled 2021-04-05: qty 8

## 2021-04-05 MED ORDER — ASPIRIN 81 MG PO CHEW
81.0000 mg | CHEWABLE_TABLET | ORAL | Status: AC
  Administered 2021-04-06: 81 mg via ORAL
  Filled 2021-04-05: qty 1

## 2021-04-05 MED ORDER — SODIUM CHLORIDE 0.9% FLUSH: 3.0000 mL | INTRAVENOUS | Status: DC | PRN

## 2021-04-05 MED ORDER — ALBUTEROL SULFATE (2.5 MG/3ML) 0.083% IN NEBU: 2.5000 mg | INHALATION_SOLUTION | RESPIRATORY_TRACT | Status: DC | PRN

## 2021-04-05 MED ORDER — SODIUM CHLORIDE 0.9 % IV SOLN: INTRAVENOUS | Status: DC

## 2021-04-05 MED ORDER — ANGIOPLASTY BOOK
Freq: Once | Status: DC
  Filled 2021-04-05: qty 1

## 2021-04-05 MED ORDER — POTASSIUM CHLORIDE CRYS ER 20 MEQ PO TBCR
20.0000 meq | EXTENDED_RELEASE_TABLET | Freq: Once | ORAL | Status: AC
  Administered 2021-04-05: 20 meq via ORAL
  Filled 2021-04-05: qty 1

## 2021-04-05 MED ORDER — SODIUM CHLORIDE 0.9 % IV SOLN: 250.0000 mL | INTRAVENOUS | Status: DC | PRN

## 2021-04-05 MED ORDER — SODIUM CHLORIDE 0.9% FLUSH
3.0000 mL | Freq: Two times a day (BID) | INTRAVENOUS | Status: DC
  Administered 2021-04-05: 3 mL via INTRAVENOUS

## 2021-04-05 MED ORDER — FUROSEMIDE 10 MG/ML IJ SOLN
80.0000 mg | Freq: Once | INTRAMUSCULAR | Status: AC
  Administered 2021-04-05: 80 mg via INTRAVENOUS
  Filled 2021-04-05: qty 8

## 2021-04-05 NOTE — Assessment & Plan Note (Addendum)
Work up by EGD most recently 03/14/2021 by Dr. Lorenso Courier revealed improving esophagitis and GAVE without active bleeding as well as duodenal ulcer.  ?- PPI and NSAID avoidance were recommended which we will continue here.  ?- Recheck CBC at follow up. Has GI follow up scheduled. ?

## 2021-04-05 NOTE — Progress Notes (Signed)
Mobility Specialist Progress Note ? ? 04/05/21 1304  ?Mobility  ?Activity Transferred from chair to bed  ?Level of Assistance Contact guard assist, steadying assist  ?Assistive Device Front wheel walker  ?Distance Ambulated (ft) 4 ft  ?Activity Response Tolerated well  ?$Mobility charge 1 Mobility  ? ?Pt received in chair c/o not getting any sleep, requested for MS to come back later. Assisted pt back to bed w/ CG and mod vocal cues on direction d/t impulsiveness. Will f/u later if time permits.  ? ?Pre Mobility: 93% SpO2 ?During Mobility: 89% SpO2 ?Post Mobility: 92% SpO2 ? ?Holland Falling ?Mobility Specialist ?Phone Number [phone] ? ?

## 2021-04-05 NOTE — Progress Notes (Signed)
Mobility Specialist Progress Note ? ? 04/05/21 1500  ?Mobility  ?Activity Ambulated with assistance in room  ?Level of Assistance Standby assist, set-up cues, supervision of patient - no hands on  ?Assistive Device Front wheel walker  ?Distance Ambulated (ft) 84 ft  ?Activity Response Tolerated well  ?$Mobility charge 1 Mobility  ? ?Received pt in bed c/o being tired but agreeable. No complaints throughout session. SpO2 ranging from 91% - 95% on RA. Short bout of V-tach when ambulating but pt stating to be feeling fine. Left in bed w/ call bell in reach and RN notified.  ? ?Pre Mobility: 81 HR, 94% SpO2 ?During Mobility: 101 HR, 91% SpO2 ?Post Mobility: 86 HR, 95% SpO2 ? ?Holland Falling ?Mobility Specialist ?Phone Number [phone] ? ?

## 2021-04-05 NOTE — Assessment & Plan Note (Addendum)
Due to chronic GI blood loss. Work up by EGD most recently 03/14/2021 by Dr. Lorenso Courier revealed improving esophagitis and GAVE without active bleeding as well as duodenal ulcer. PPI and NSAID avoidance were recommended which we will continue here.  ?- Iron stores severely low (ferritin 9, 4% sat, iron 13, TIBC 340). Given IV iron 3/20. Continue monitoring and treating as outpatient. Consider hematology evaluation. ?- Hgb improved s/p 1u PRBCs 3/20. Will continue monitoring at follow up. No active bleeding at this time. ?

## 2021-04-05 NOTE — TOC CM/SW Note (Signed)
HF TOC CM spoke to pt at bedside. Agreeable to [facility], offered choice and pt agreeable to [place] for [facility]. States she has Rollator at home. Will continue to assess if pt will need oxygen for home. She is currently on room air. Jonnie Finner RN3 CCM, Heart Failure TOC CM [phone]  ?

## 2021-04-05 NOTE — Assessment & Plan Note (Signed)
with history of tobacco use ?- prn albuterol ?

## 2021-04-05 NOTE — Evaluation (Signed)
Occupational Therapy Evaluation ?Patient Details ?Name: Jacqueline Harris ?MRN: [id number] ?DOB: 04/14/1945 ?Today's Date: 04/05/2021 ? ? ?History of Present Illness 76 yo admitted 3/19 with dyspnea. Pt with acute hypoxemic respiratory failure due to pulm HTN and chronic diastolic heart failure with bil pleural effusions s/p thoracentesis 3/20. PMhx: CHf, GAVE syndrome, anemia, HTN  ? ?Clinical Impression ?  ?Pt reports independence at baseline with ADLs and functional mobility, pt set up -mod A for ADLs, mod I for bed mobility, and min guard for transfers. Pt lives alone, but states granddaughter assists as needed with household tasks. Pt on RA upon arrival, SpO2 88% after ambulation to bathroom, recovered to mid 90's after ~2 mins. Pt presenting with impairments listed below, will follow acutely. Recommend HHOT at d/c. ?   ? ?Recommendations for follow up therapy are one component of a multi-disciplinary discharge planning process, led by the attending physician.  Recommendations may be updated based on patient status, additional functional criteria and insurance authorization.  ? ?Follow Up Recommendations ? Home health OT  ?  ?Assistance Recommended at Discharge Set up Supervision/Assistance  ?Patient can return home with the following A little help with walking and/or transfers;A little help with bathing/dressing/bathroom;Help with stairs or ramp for entrance;Assist for transportation;Assistance with cooking/housework ? ?  ?Functional Status Assessment ? Patient has had a recent decline in their functional status and demonstrates the ability to make significant improvements in function in a reasonable and predictable amount of time.  ?Equipment Recommendations ? BSC/3in1  ?  ?Recommendations for Other Services   ? ? ?  ?Precautions / Restrictions Precautions ?Precautions: Fall ?Restrictions ?Weight Bearing Restrictions: No  ? ?  ? ?Mobility Bed Mobility ?Overal bed mobility: Modified Independent ?  ?  ?  ?  ?  ?   ?General bed mobility comments: sit <> supine with minimal use of bedrails ?  ? ?Transfers ?Overall transfer level: Needs assistance ?Equipment used: None ?Transfers: Sit to/from Stand ?Sit to Stand: Min guard ?  ?  ?  ?  ?  ?  ?  ? ?  ?Balance Overall balance assessment: Needs assistance ?  ?Sitting balance-Leahy Scale: Good ?Sitting balance - Comments: able to reach outside BOS without LOB ?  ?  ?  ?  ?  ?  ?  ?  ?  ?  ?  ?  ?  ?  ?  ?   ? ?ADL either performed or assessed with clinical judgement  ? ?ADL Overall ADL's : Needs assistance/impaired ?Eating/Feeding: Set up;Sitting ?  ?Grooming: Set up;Sitting;Standing ?Grooming Details (indicate cue type and reason): washes hands standing at sink ?Upper Body Bathing: Set up;Sitting ?  ?Lower Body Bathing: Moderate assistance;Sitting/lateral leans ?  ?Upper Body Dressing : Set up;Sitting ?  ?Lower Body Dressing: Moderate assistance;Sitting/lateral leans ?  ?Toilet Transfer: Min guard;Ambulation;Regular Toilet ?  ?Toileting- Clothing Manipulation and Hygiene: Supervision/safety ?Toileting - Clothing Manipulation Details (indicate cue type and reason): completes pericare ?  ?  ?Functional mobility during ADLs: Min guard ?   ? ? ? ?Vision   ?Vision Assessment?: No apparent visual deficits  ?   ?Perception   ?  ?Praxis   ?  ? ?Pertinent Vitals/Pain Pain Assessment ?Pain Assessment: No/denies pain  ? ? ? ?Hand Dominance Right ?  ?Extremity/Trunk Assessment Upper Extremity Assessment ?Upper Extremity Assessment: Generalized weakness ?  ?Lower Extremity Assessment ?Lower Extremity Assessment: Defer to PT evaluation ?  ?Cervical / Trunk Assessment ?Cervical / Trunk Assessment: Normal ?  ?Communication  Communication ?Communication: No difficulties ?  ?Cognition Arousal/Alertness: Awake/alert ?Behavior During Therapy: [facility] for tasks assessed/performed ?Overall Cognitive Status: Within Functional Limits for tasks assessed ?  ?  ?  ?  ?  ?  ?  ?  ?  ?  ?  ?  ?  ?  ?  ?  ?  ?  ?   ?General Comments  family member present in room throughout, VSS on RA, spO2 dropping into high 80's after short ambulation to bathroom ? ?  ?Exercises   ?  ?Shoulder Instructions    ? ? ?Home Living Family/patient expects to be discharged to:: Private residence ?Living Arrangements: Alone ?Available Help at Discharge: Family;Available PRN/intermittently ?Type of Home: House ?Home Access: Stairs to enter ?Entrance Stairs-Number of Steps: 13 ?Entrance Stairs-Rails: Right ?Home Layout: Two level;Laundry or work area in basement ?Alternate Level Stairs-Number of Steps: 14 ?Alternate Level Stairs-Rails: Right ?Bathroom Shower/Tub: Gaffer;Tub/shower unit ?  ?Bathroom Toilet: Standard ?Bathroom Accessibility: Yes ?  ?Home Equipment: Conservation officer, nature (2 wheels) ?  ?Additional Comments: 2 falls recently ?  ? ?  ?Prior Functioning/Environment Prior Level of Function : Independent/Modified Independent ?  ?  ?  ?  ?  ?  ?  ?ADLs Comments: granddaughter helps with groceries and cleaning ?  ? ?  ?  ?OT Problem List: Decreased strength;Decreased range of motion;Decreased activity tolerance;Impaired balance (sitting and/or standing);Decreased knowledge of use of DME or AE;Decreased safety awareness;Cardiopulmonary status limiting activity ?  ?   ?OT Treatment/Interventions: Self-care/ADL training;Therapeutic exercise;Energy conservation;DME and/or AE instruction;Therapeutic activities;Patient/family education;Balance training  ?  ?OT Goals(Current goals can be found in the care plan section) Acute Rehab OT Goals ?Patient Stated Goal: to rest ?OT Goal Formulation: With patient ?Time For Goal Achievement: 04/19/21 ?Potential to Achieve Goals: Good ?ADL Goals ?Pt Will Perform Upper Body Dressing: with modified independence;sitting ?Pt Will Perform Lower Body Dressing: with supervision;sitting/lateral leans;sit to/from stand ?Pt Will Transfer to Toilet: with modified independence;regular height toilet;ambulating ?Pt Will  Perform Tub/Shower Transfer: with supervision;ambulating;Shower transfer;Tub transfer;3 in 1  ?OT Frequency: Min 2X/week ?  ? ?Co-evaluation   ?  ?  ?  ?  ? ?  ?AM-PAC OT "6 Clicks" Daily Activity     ?Outcome Measure Help from another person eating meals?: None ?Help from another person taking care of personal grooming?: A Little ?Help from another person toileting, which includes using toliet, bedpan, or urinal?: A Little ?Help from another person bathing (including washing, rinsing, drying)?: A Little ?Help from another person to put on and taking off regular upper body clothing?: A Little ?Help from another person to put on and taking off regular lower body clothing?: A Little ?6 Click Score: 19 ?  ?End of Session Equipment Utilized During Treatment: Gait belt ?Nurse Communication: Mobility status ? ?Activity Tolerance: Patient tolerated treatment well ?Patient left: in bed;with call bell/phone within reach;with bed alarm set;with family/visitor present ? ?OT Visit Diagnosis: Unsteadiness on feet (R26.81);Other abnormalities of gait and mobility (R26.89);Muscle weakness (generalized) (M62.81)  ?              ?Time: 7741-4239 ?OT Time Calculation (min): 16 min ?Charges:  OT General Charges ?$OT Visit: 1 Visit ?OT Evaluation ?$OT Eval Low Complexity: 1 Low ? ?Lynnda Child, OTD, OTR/L ?Acute Rehab ?[phone] ? ?Kaylyn Lim ?04/05/2021, 2:30 PM ?

## 2021-04-05 NOTE — Progress Notes (Addendum)
? ? Advanced Heart Failure Rounding Note ? ?PCP-Cardiologist: Rozann Lesches, MD  ?[facility]: Dr. Haroldine Laws  ? ?Subjective:   ? ?1.4L in UOP charted + 1 unmeasured void w/ IV Lasix 80 mg x 2  ? ?S/p L thoracentesis yesterday w/ 850 mL straw colored fluid removal.  Pleural fluid studies are concerning for exudative effusion based on total protein ratio and albumin gradients. GS negative, cultures negative so far, cytology pending.  ? ?SCr trending up, 1.25>>1.30>>1.42 ? ?K 4.0  ?Mg 2.7  ? ?Hgb up post transfusion x 1 uRBC, 7.4>>9.1  ? ?Dyspnea improved but remains on 2 L [place]. Slight wheezing noted on exam.  ? ?Objective:   ?Weight Range: ?56.3 kg ?Body mass index is 21.32 kg/m?.  ? ?Vital Signs:   ?Temp:  [97.7 ?F (36.5 ?C)-98.4 ?F (36.9 ?C)] 98.3 ?F (36.8 ?C) (03/21 0800) ?Pulse Rate:  [84-94] 92 (03/21 0800) ?Resp:  [16-22] 20 (03/21 0800) ?BP: (95-129)/(43-66) 129/51 (03/21 0800) ?SpO2:  [87 %-98 %] 96 % (03/21 0800) ?Weight:  [56.3 kg] 56.3 kg (03/21 0421) ?Last BM Date : 04/02/21 ? ?Weight change: ?Filed Weights  ? 04/03/21 1855 04/05/21 0421  ?Weight: 56.2 kg 56.3 kg  ? ? ?Intake/Output:  ? ?Intake/Output Summary (Last 24 hours) at 04/05/2021 0960 ?Last data filed at 04/05/2021 0422 ?Gross per 24 hour  ?Intake 1076 ml  ?Output 1400 ml  ?Net -324 ml  ?  ? ? ?Physical Exam  ?  ?General:  [name]. + wheezing on exam but no increased WOB ?HEENT: Normal ?Neck: Supple. JVP not well visualized . Carotids 2+ bilat; no bruits. No lymphadenopathy or thyromegaly appreciated. ?Cor: PMI nondisplaced. Regular rate & rhythm. 2/6 TR murmur  ?Lungs: Clear ?Abdomen: Soft, nontender, nondistended. No hepatosplenomegaly. No bruits or masses. Good bowel sounds. ?Extremities: No cyanosis, clubbing, rash, trace b/L LE edema ?Neuro: Alert & orientedx3, cranial nerves grossly intact. moves all 4 extremities w/o difficulty. Affect pleasant ? ? ?Telemetry  ? ?NSR 90s  ? ?EKG  ?  ?No new EKG to review  ? ?Labs  ?   ?CBC ?Recent Labs  ?  04/04/21 ?4540 04/05/21 ?0242  ?WBC 5.8 8.3  ?NEUTROABS 3.8  --   ?HGB 7.4* 9.1*  ?HCT 25.3* 29.1*  ?MCV 67.1* 67.2*  ?PLT 308 333  ? ?Basic Metabolic Panel ?Recent Labs  ?  04/04/21 ?9811 04/05/21 ?0242  ?NA 137 133*  ?K 3.8 4.0  ?CL 102 99  ?CO2 25 25  ?GLUCOSE 109* 138*  ?BUN 23 24*  ?CREATININE 1.30* 1.42*  ?CALCIUM 8.3* 8.1*  ?MG 1.6* 2.7*  ? ?Liver Function Tests ?Recent Labs  ?  04/03/21 ?1930 04/04/21 ?0840  ?AST 10* 13*  ?ALT 6 9  ?ALKPHOS 60 51  ?BILITOT 0.6 0.7  ?PROT 6.6 5.6*  ?ALBUMIN 3.1* 2.3*  ? ?No results for input(s): LIPASE, AMYLASE in the last 72 hours. ?Cardiac Enzymes ?No results for input(s): CKTOTAL, CKMB, CKMBINDEX, TROPONINI in the last 72 hours. ? ?BNP: ?BNP (last 3 results) ?Recent Labs  ?  07/08/20 ?1228 12/27/20 ?1546 04/03/21 ?1930  ?BNP 105.5* 372.0* 252.9*  ? ? ?ProBNP (last 3 results) ?No results for input(s): PROBNP in the last 8760 hours. ? ? ?D-Dimer ?Recent Labs  ?  04/03/21 ?1930  ?DDIMER 1.40*  ? ?Hemoglobin A1C ?No results for input(s): HGBA1C in the last 72 hours. ?Fasting Lipid Panel ?No results for input(s): CHOL, HDL, LDLCALC, TRIG, CHOLHDL, LDLDIRECT in the last 72 hours. ?Thyroid Function Tests ?No  results for input(s): TSH, T4TOTAL, T3FREE, THYROIDAB in the last 72 hours. ? ?Invalid input(s): FREET3 ? ?Other results: ? ? ?Imaging  ? ? ?DG Chest 1 View ? ?Result Date: 04/04/2021 ?CLINICAL DATA:  Status post thoracentesis EXAM: CHEST  1 VIEW COMPARISON:  Previous studies including the examination of 04/03/2021 FINDINGS: Transverse diameter of heart is increased. Central pulmonary vessels are prominent. There is improvement in aeration of left lower lung fields. There is interval decrease in interstitial markings in the parahilar regions and lower lung fields, possibly suggesting resolving pulmonary edema. There is blunting of both lateral CP angles. There is no pneumothorax. IMPRESSION: There is interval decrease in left pleural effusion. Small  residual bilateral pleural effusions are seen, more so on the left side. There is no pneumothorax. There is interval decrease in interstitial markings in the parahilar regions and lower lung fields suggesting possible resolving pulmonary edema. Electronically Signed   By: Elmer Picker M.D.   On: 04/04/2021 15:31  ? ?ECHOCARDIOGRAM COMPLETE ? ?Result Date: 04/04/2021 ?   ECHOCARDIOGRAM REPORT   Patient Name:   Jacqueline Harris Date of Exam: 04/04/2021 Medical Rec #:  [id number]      Height:       64.0 in Accession #:    [id number]     Weight:       124.0 lb Date of Birth:  August 31, 1945      BSA:          1.597 m? Patient Age:    76 years       BP:           125/46 mmHg Patient Gender: F              HR:           93 bpm. Exam Location:  Inpatient Procedure: 2D Echo Indications:    Acute diastolic CHF  History:        Patient has prior history of Echocardiogram examinations, most                 recent 12/15/2020. CHF; Risk Factors:Hypertension and                 Dyslipidemia.  Sonographer:    Arlyss Gandy Referring Phys: [place]  1. Effusion smaller than seen on TTE 12/15/20.  2. Left ventricular ejection fraction, by estimation, is 50 to 55%. The left ventricle has low normal function. The left ventricle has no regional wall motion abnormalities. Left ventricular diastolic parameters were normal.  3. Right ventricular systolic function is mildly reduced. The right ventricular size is mildly enlarged. There is severely elevated pulmonary artery systolic pressure.  4. Left atrial size was mildly dilated.  5. Right atrial size was mildly dilated.  6. A small pericardial effusion is present. The pericardial effusion is posterior to the left ventricle and anterior to the right ventricle.  7. The mitral valve is abnormal. Mild mitral valve regurgitation. No evidence of mitral stenosis.  8. Tricuspid valve regurgitation is mild to moderate.  9. The aortic valve is tricuspid. There is mild calcification  of the aortic valve. There is mild thickening of the aortic valve. Aortic valve regurgitation is not visualized. Aortic valve sclerosis is present, with no evidence of aortic valve stenosis. 10. The inferior vena cava is normal in size with greater than 50% respiratory variability, suggesting right atrial pressure of 3 mmHg. FINDINGS  Left Ventricle: Left ventricular ejection fraction, by estimation, is 50 to  55%. The left ventricle has low normal function. The left ventricle has no regional wall motion abnormalities. The left ventricular internal cavity size was normal in size. There is no left ventricular hypertrophy. Left ventricular diastolic parameters were normal. Right Ventricle: The right ventricular size is mildly enlarged. No increase in right ventricular wall thickness. Right ventricular systolic function is mildly reduced. There is severely elevated pulmonary artery systolic pressure. The tricuspid regurgitant velocity is 3.91 m/s, and with an assumed right atrial pressure of 8 mmHg, the estimated right ventricular systolic pressure is 46.5 mmHg. Left Atrium: Left atrial size was mildly dilated. Right Atrium: Right atrial size was mildly dilated. Pericardium: A small pericardial effusion is present. The pericardial effusion is posterior to the left ventricle and anterior to the right ventricle. Mitral Valve: The mitral valve is abnormal. There is mild thickening of the mitral valve leaflet(s). There is mild calcification of the mitral valve leaflet(s). Mild mitral valve regurgitation. No evidence of mitral valve stenosis. Tricuspid Valve: The tricuspid valve is normal in structure. Tricuspid valve regurgitation is mild to moderate. No evidence of tricuspid stenosis. Aortic Valve: The aortic valve is tricuspid. There is mild calcification of the aortic valve. There is mild thickening of the aortic valve. Aortic valve regurgitation is not visualized. Aortic valve sclerosis is present, with no evidence of  aortic valve stenosis. Aortic valve mean gradient measures 7.0 mmHg. Aortic valve peak gradient measures 12.8 mmHg. Aortic valve area, by VTI measures 2.14 cm?. Pulmonic Valve: The pulmonic valve was normal in structu

## 2021-04-05 NOTE — Progress Notes (Signed)
?Progress Note ? ?Patient: Jacqueline Harris DOB: 05-25-1945  ?DOA: 04/03/2021  DOS: 04/05/2021  ?  ?Brief hospital course: ?Jacqueline Harris is a 76 y.o. female with a history of right heart failure, PAH, ILD, pericardial effusion who presented to the ED 3/19 with worsening shortness of breath despite increasing lasix as recommended by Dr. Haroldine Laws. She was hypoxic, afebrile, with elevated BNP (252), normal WBC (8.2k), elevated d-dimer (1.4) with subsequent CTA chest showing no PE. There is shotty lymphadenopathy, large left, and moderate right, pleural effusions that appeared to be free-flowing and an enlarged pericardial effusion. BP 122/46, HR 88. IV lasix was given, cardiology consulted, and the patient admitted. Echo actually showed moderate, decreased volume pericardial effusion. Thoracentesis performed 8/20 yielding 850cc straw-colored exudate drained from left pleural space. PCCM is consulted and heart failure team is following. IV diuresis has been stopped due to AKI, though patient remains hypoxic. ?  ?Assessment and Plan: ?* Acute diastolic CHF (congestive heart failure) ([place]) ?Diuresis limited by ump in creatinine, cardiology guiding diuresis, holding further lasix today. Still with LE edema. ?- May need RHC this admit. ? ?AKI (acute kidney injury) ([place]) ?- Monitor daily, avoid nephrotoxins ? ?Bilateral pleural effusion - L >> R ?s/p thoracentesis on left concerning for exudate w/negative GS, culture and cytology pending. Still hypoxic, may need right-side thora for Dx and Tx purposes.  ? ?Acute respiratory failure with hypoxia ([place]) ?Due to [facility] and acute on chronic HFpEF with pleural effusions.  ?- Continue supplemental oxygen and treating contributing conditions. ? ?Hilar adenopathy ?- Defer further evaluation to pulmonary team. ? ?ILD (interstitial lung disease) ([place]) ?Followed by Dr. Elsworth Soho as outpatient.  ?- PCCM consulted here. ? ?Primary pulmonary hypertension ([place]) ?- Continue sildenafil,  macitentan.  ?- CTD work up only +ANA in past, repeat pending. ? ?RVF (right ventricular failure) ([place]) ?Cardiology assessment appreciated. ? ?Pericardial effusion ?Echo reveals smaller effusion, no tamponade at this time. No drainage recommended. ? ?GAVE (gastric antral vascular ectasia) ?Work up by EGD most recently 03/14/2021 by Dr. Lorenso Courier revealed improving esophagitis and GAVE without active bleeding as well as duodenal ulcer.  ?- PPI and NSAID avoidance were recommended which we will continue here.  ? ?Iron deficiency anemia ?Due to chronic GI blood loss. Work up by EGD most recently 03/14/2021 by Dr. Lorenso Courier revealed improving esophagitis and GAVE without active bleeding as well as duodenal ulcer. PPI and NSAID avoidance were recommended which we will continue here.  ?- Iron stores severely low (ferritin 9, 4% sat, iron 13, TIBC 340). Given IV iron 3/20. ?- Hgb improved s/p 1u PRBCs 3/20. Will continue monitoring. ? ?Subjective: Dyspneic at rest worse with exertion. No chest pain. Having left mid back pain that got worse the longer she was in bed. No trouble swallowing or bleeding reported. ? ?Objective: ?Vitals:  ? 04/04/21 1958 04/04/21 2105 04/05/21 0421 04/05/21 0800  ?BP: (!) 122/45 (!) 125/50 (!) 123/47 (!) 129/51  ?Pulse: 89  92 92  ?Resp: 16 (!) 22 (!) 22 20  ?Temp: 98.4 ?F (36.9 ?C) 97.9 ?F (36.6 ?C) 98.4 ?F (36.9 ?C) 98.3 ?F (36.8 ?C)  ?TempSrc: Oral Oral Oral Oral  ?SpO2: 96% 96% 95% 96%  ?Weight:   56.3 kg   ?Height:      ? ?Gen: Non-toxic 76 y.o. female in no distress ?Pulm: Nonlabored, diminished at bases with crackles. Fleeting wheeze.  ?CV: Regular rate and rhythm. No murmur, rub, or gallop. Remains with 1+ dependent edema. ?GI: Abdomen soft,  non-tender, non-distended, with normoactive bowel sounds.  ?Ext: Warm, no deformities ?MSK: Back with no visible or palpable wounds or deformities. ?Skin: No rashes, lesions or ulcers on visualized skin. Thora puncture site is c/d/i ?Neuro: Alert and  oriented. No focal neurological deficits. ?Psych: Judgement and insight appear fair. Mood euthymic & affect congruent. Behavior is appropriate.   ? ?Data Personally reviewed: ? ?CBC: ?Recent Labs  ?Lab 04/03/21 ?1930 04/04/21 ?0634 04/05/21 ?0242  ?WBC 8.2 5.8 8.3  ?NEUTROABS  --  3.8  --   ?HGB 8.2* 7.4* 9.1*  ?HCT 28.1* 25.3* 29.1*  ?MCV 67.7* 67.1* 67.2*  ?PLT 356 308 333  ? ?Basic Metabolic Panel: ?Recent Labs  ?Lab 04/03/21 ?1930 04/04/21 ?9494 04/05/21 ?0242  ?NA 138 137 133*  ?K 4.2 3.8 4.0  ?CL 103 102 99  ?CO2 _0 ?GLUCOSE 132* 109* 138*  ?BUN 30* 23 24*  ?CREATININE 1.25* 1.30* 1.42*  ?CALCIUM 8.6* 8.3* 8.1*  ?MG  --  1.6* 2.7*  ? ?GFR: ?Estimated Creatinine Clearance: 30.5 mL/min (A) (by C-G formula based on SCr of 1.42 mg/dL (H)). ?Liver Function Tests: ?Recent Labs  ?Lab 04/03/21 ?1930 04/04/21 ?0840  ?AST 10* 13*  ?ALT 6 9  ?ALKPHOS 60 51  ?BILITOT 0.6 0.7  ?PROT 6.6 5.6*  ?ALBUMIN 3.1* 2.3*  ? ?DG Chest 1 View ? ?Result Date: 04/04/2021 ?CLINICAL DATA:  Status post thoracentesis EXAM: CHEST  1 VIEW COMPARISON:  Previous studies including the examination of 04/03/2021 FINDINGS: Transverse diameter of heart is increased. Central pulmonary vessels are prominent. There is improvement in aeration of left lower lung fields. There is interval decrease in interstitial markings in the parahilar regions and lower lung fields, possibly suggesting resolving pulmonary edema. There is blunting of both lateral CP angles. There is no pneumothorax. IMPRESSION: There is interval decrease in left pleural effusion. Small residual bilateral pleural effusions are seen, more so on the left side. There is no pneumothorax. There is interval decrease in interstitial markings in the parahilar regions and lower lung fields suggesting possible resolving pulmonary edema. Electronically Signed   By: Elmer Picker M.D.   On: 04/04/2021 15:31  ? ?CT Angio Chest PE W/Cm &/Or Wo Cm ? ?Result Date: 04/03/2021 ?CLINICAL DATA:   Pulmonary embolism (PE) suspected, positive D-dimer Shortness of breath.  Elevated D-dimer. EXAM: CT ANGIOGRAPHY CHEST WITH CONTRAST TECHNIQUE: Multidetector CT imaging of the chest was performed using the standard protocol during bolus administration of intravenous contrast. Multiplanar CT image reconstructions and MIPs were obtained to evaluate the vascular anatomy. RADIATION DOSE REDUCTION: This exam was performed according to the departmental dose-optimization program which includes automated exposure control, adjustment of the mA and/or kV according to patient size and/or use of iterative reconstruction technique. CONTRAST:  56m OMNIPAQUE IOHEXOL 350 MG/ML SOLN COMPARISON:  Chest radiograph earlier today. Chest CT 02/16/2021. chest CT 12/21/2019 also reviewed FINDINGS: Cardiovascular: There are no filling defects within the pulmonary arteries to suggest pulmonary embolus. No subsegmental pulmonary arteries are not well assessed due to contrast bolus timing. Aortic atherosclerosis. No aortic dissection or acute aortic findings. The left vertebral artery arises directly from the thoracic aorta, variant arch anatomy coronary artery calcifications. Multi chamber cardiomegaly. A moderate-sized pericardial effusion has increased from prior chest CT. This measures a new air from 12-21 mm in depth. There is no convincing pericardial enhancement or nodularity. Mediastinum/Nodes: Shotty bilateral hilar lymph nodes measuring up to 10 mm on the left and 14 mm on the right 12 mm  subcarinal node. No thyroid nodule. Small hiatal hernia. The previous left paraesophageal soft tissue attenuation structure is poorly defined on the current exam due to adjacent pleural and pericardial effusion, faintly visualized on series 4, image 113 and series 7, image 67. This cannot be further characterized. There are prominent bilateral axillary nodes. This includes a 9 mm lymph node in the left axilla and 8 mm node in the right axilla,  unchanged from prior exam. Lungs/Pleura: Moderate to large left pleural effusion which has increased in size from CT last month. Moderate-sized right pleural effusion has also increased in size. There is compres

## 2021-04-05 NOTE — Evaluation (Signed)
Physical Therapy Evaluation ?Patient Details ?Name: Jacqueline Harris ?MRN: [id number] ?DOB: 17-Jun-1945 ?Today's Date: 04/05/2021 ? ?History of Present Illness ? 76 yo admitted 3/19 with dyspnea. Pt with acute hypoxemic respiratory failure due to pulm HTN and chronic diastolic heart failure with bil pleural effusions s/p thoracentesis 3/20. PMhx: CHf, GAVE syndrome, anemia, HTN  ?Clinical Impression ? PT pleasant who lives at home alone with flight of stairs to enter home from the basement. Pt reports fatigue and unsteady gait with 2 recent falls with assist needed to get up. Pt required support of RW today for stability and demonstrates decreased balance, strength and activity tolerance who will benefit from acute therapy to maximize mobility, safety and function. Pt also noted to have gelatinous discharge with gait (unclear if vaginal or rectal) with RN aware.    ? ?BP 106/48 (63) ?HR 93 ?SpO2 94-97% on 2L ?   ? ?Recommendations for follow up therapy are one component of a multi-disciplinary discharge planning process, led by the attending physician.  Recommendations may be updated based on patient status, additional functional criteria and insurance authorization. ? ?Follow Up Recommendations Home health PT ? ?  ?Assistance Recommended at Discharge Intermittent Supervision/Assistance  ?Patient can return home with the following ? A little help with bathing/dressing/bathroom;Assistance with cooking/housework;Help with stairs or ramp for entrance ? ?  ?Equipment Recommendations None recommended by PT  ?Recommendations for Other Services ?    ?  ?Functional Status Assessment Patient has had a recent decline in their functional status and demonstrates the ability to make significant improvements in function in a reasonable and predictable amount of time.  ? ?  ?Precautions / Restrictions Precautions ?Precautions: Fall  ? ?  ? ?Mobility ? Bed Mobility ?Overal bed mobility: Modified Independent ?  ?  ?  ?  ?  ?  ?  ?   ? ?Transfers ?Overall transfer level: Needs assistance ?  ?Transfers: Sit to/from Stand ?Sit to Stand: Min guard, Min assist ?  ?  ?  ?  ?  ?General transfer comment: minguard to rise from bed, min assist to rise from low toilet with cues for hand placement ?  ? ?Ambulation/Gait ?Ambulation/Gait assistance: Supervision ?Gait Distance (Feet): 160 Feet ?Assistive device: Rolling walker (2 wheels) ?Gait Pattern/deviations: Step-through pattern, Decreased stride length ?  ?Gait velocity interpretation: 1.31 - 2.62 ft/sec, indicative of limited community ambulator ?  ?General Gait Details: supervision for safety and lines with reliance on RW for stability and endurance. Pt on 2L throughout with sats 94% ? ?Stairs ?  ?  ?  ?  ?  ? ?Wheelchair Mobility ?  ? ?Modified Rankin (Stroke Patients Only) ?  ? ?  ? ?Balance Overall balance assessment: Needs assistance ?  ?Sitting balance-Leahy Scale: Good ?Sitting balance - Comments: EOB and toilet without UE support ?  ?Standing balance support: Bilateral upper extremity supported ?Standing balance-Leahy Scale: Poor ?Standing balance comment: RW for standing, static standing at sink without UE support ?  ?  ?  ?  ?  ?  ?  ?  ?  ?  ?  ?   ? ? ? ?Pertinent Vitals/Pain Pain Assessment ?Pain Assessment: No/denies pain  ? ? ?Home Living Family/patient expects to be discharged to:: Private residence ?Living Arrangements: Alone ?Available Help at Discharge: Family;Available PRN/intermittently ?Type of Home: House ?Home Access: Stairs to enter ?Entrance Stairs-Rails: Right ?Entrance Stairs-Number of Steps: 13 ?Alternate Level Stairs-Number of Steps: 14 ?Home Layout: Two level;Laundry or work area in basement ?  Home Equipment: Conservation officer, nature (2 wheels) ?Additional Comments: 2 falls recently  ?  ?Prior Function Prior Level of Function : Independent/Modified Independent ?  ?  ?  ?  ?  ?  ?  ?ADLs Comments: granddaughter helps with groceries and cleaning ?  ? ? ?Hand Dominance  ?   ? ?   ?Extremity/Trunk Assessment  ? Upper Extremity Assessment ?Upper Extremity Assessment: Overall WFL for tasks assessed ?  ? ?Lower Extremity Assessment ?Lower Extremity Assessment: Generalized weakness ?  ? ?Cervical / Trunk Assessment ?Cervical / Trunk Assessment: Normal  ?Communication  ? Communication: No difficulties  ?Cognition Arousal/Alertness: Awake/alert ?Behavior During Therapy: [facility] for tasks assessed/performed ?Overall Cognitive Status: Within Functional Limits for tasks assessed ?  ?  ?  ?  ?  ?  ?  ?  ?  ?  ?  ?  ?  ?  ?  ?  ?  ?  ?  ? ?  ?General Comments   ? ?  ?Exercises    ? ?Assessment/Plan  ?  ?PT Assessment Patient needs continued PT services  ?PT Problem List Decreased mobility;Decreased activity tolerance;Decreased balance;Decreased knowledge of use of DME ? ?   ?  ?PT Treatment Interventions Gait training;Stair training;Balance training;DME instruction;Therapeutic exercise;Functional mobility training;Therapeutic activities;Patient/family education   ? ?PT Goals (Current goals can be found in the Care Plan section)  ?Acute Rehab PT Goals ?Patient Stated Goal: be able to return home, garden ?PT Goal Formulation: With patient ?Time For Goal Achievement: 04/19/21 ?Potential to Achieve Goals: Fair ? ?  ?Frequency Min 3X/week ?  ? ? ?Co-evaluation   ?  ?  ?  ?  ? ? ?  ?AM-PAC PT "6 Clicks" Mobility  ?Outcome Measure Help needed turning from your back to your side while in a flat bed without using bedrails?: None ?Help needed moving from lying on your back to sitting on the side of a flat bed without using bedrails?: None ?Help needed moving to and from a bed to a chair (including a wheelchair)?: A Little ?Help needed standing up from a chair using your arms (e.g., wheelchair or bedside chair)?: A Little ?Help needed to walk in hospital room?: A Little ?Help needed climbing 3-5 steps with a railing? : A Little ?6 Click Score: 20 ? ?  ?End of Session Equipment Utilized During Treatment:  Oxygen ?Activity Tolerance: Patient tolerated treatment well ?Patient left: in chair;with call bell/phone within reach;with chair alarm set ?Nurse Communication: Mobility status ?PT Visit Diagnosis: Other abnormalities of gait and mobility (R26.89);Difficulty in walking, not elsewhere classified (R26.2) ?  ? ?Time: [phone] ?PT Time Calculation (min) (ACUTE ONLY): 37 min ? ? ?Charges:   PT Evaluation ?$PT Eval Moderate Complexity: 1 Mod ?PT Treatments ?$Gait Training: 8-22 mins ?  ?   ? ? ?Lavontae Cornia P, PT ?Acute Rehabilitation Services ?Pager: [phone] ?Office: [phone] ? ? ?Demonta Wombles B Mariana Goytia ?04/05/2021, 10:30 AM ? ?

## 2021-04-05 NOTE — Assessment & Plan Note (Signed)
Continue 14 days diflucan started PTA. ?

## 2021-04-05 NOTE — Consult Note (Signed)
04/05/2021 ? ?I have seen and evaluated the patient for effusion and questionable ILD. ? ?S:  ?Breathing improved but not back to baseline. ? ?O: ?Blood pressure (!) 104/42, pulse 81, temperature 97.7 ?F (36.5 ?C), temperature source Oral, resp. rate 18, height _0  (1.626 m), weight 56.3 kg, SpO2 92 %.  ?No distress ?Crackles bases, I do not hear wheezing ?Ext without edema ?Moves all 4 ext to command ? ?Discussion:  ? ?CT chest reviewed: not very impressive for ILD ? ?[place] Dec 2022: PVR not impressive at 4.6 woods units; wedge high normal,CI and PAPI preserved ?Increased interstitial edema and effusions with systemic pulmonary vasodilator challenge would suggest a postcapillary (but pre-LA) component ?PVOD.   ? ?Pleural fluid analysis does meet exudative criteria; not sure what to do with this, no concurrent infectious symptoms. ? ?Overall I think she has both pre- and post- capillary pulmonary HTN that may not respond well to pulmonary vasodilators.  Consider holding these meds if continues to have interstitial edema despite achieving euvolemia. ? ?I do not think she has a significant pulmonary component to her pHTN. ? ?Encourage IS, wean O2 to sats > 90% ? ?Will be available PRN. ? ?Erskine Emery MD ?[facility] ?Prefer epic messenger for cross cover needs ?If after hours, please call E-link ? ?

## 2021-04-06 ENCOUNTER — Encounter (HOSPITAL_COMMUNITY)
Admission: EM | Disposition: A | Discharge status: Home Health | Payer: Self-pay | Source: Home / Self Care | Attending: Family Medicine

## 2021-04-06 ENCOUNTER — Ambulatory Visit: Payer: Medicare HMO | Admitting: Nurse Practitioner

## 2021-04-06 ENCOUNTER — Inpatient Hospital Stay (HOSPITAL_COMMUNITY): Payer: Medicare HMO

## 2021-04-06 ENCOUNTER — Encounter (HOSPITAL_COMMUNITY): Payer: Self-pay | Admitting: Internal Medicine

## 2021-04-06 DIAGNOSIS — J9601 Acute respiratory failure with hypoxia: Secondary | ICD-10-CM | POA: Diagnosis not present

## 2021-04-06 DIAGNOSIS — R590 Localized enlarged lymph nodes: Secondary | ICD-10-CM | POA: Diagnosis not present

## 2021-04-06 DIAGNOSIS — K209 Esophagitis, unspecified without bleeding: Secondary | ICD-10-CM | POA: Diagnosis not present

## 2021-04-06 DIAGNOSIS — N179 Acute kidney failure, unspecified: Secondary | ICD-10-CM | POA: Diagnosis not present

## 2021-04-06 DIAGNOSIS — J849 Interstitial pulmonary disease, unspecified: Secondary | ICD-10-CM | POA: Diagnosis not present

## 2021-04-06 DIAGNOSIS — I272 Pulmonary hypertension, unspecified: Secondary | ICD-10-CM

## 2021-04-06 DIAGNOSIS — K31819 Angiodysplasia of stomach and duodenum without bleeding: Secondary | ICD-10-CM | POA: Diagnosis not present

## 2021-04-06 DIAGNOSIS — I3139 Other pericardial effusion (noninflammatory): Secondary | ICD-10-CM | POA: Diagnosis not present

## 2021-04-06 DIAGNOSIS — J90 Pleural effusion, not elsewhere classified: Secondary | ICD-10-CM | POA: Diagnosis not present

## 2021-04-06 DIAGNOSIS — D509 Iron deficiency anemia, unspecified: Secondary | ICD-10-CM | POA: Diagnosis not present

## 2021-04-06 DIAGNOSIS — I5031 Acute diastolic (congestive) heart failure: Secondary | ICD-10-CM | POA: Diagnosis not present

## 2021-04-06 HISTORY — PX: RIGHT HEART CATH: CATH118263

## 2021-04-06 LAB — BASIC METABOLIC PANEL
Anion gap: 10 (ref 5–15)
BUN: 29 mg/dL — ABNORMAL HIGH (ref 8–23)
CO2: 24 mmol/L (ref 22–32)
Calcium: 8 mg/dL — ABNORMAL LOW (ref 8.9–10.3)
Chloride: 99 mmol/L (ref 98–111)
Creatinine, Ser: 1.46 mg/dL — ABNORMAL HIGH (ref 0.44–1.00)
GFR, Estimated: 38 mL/min — ABNORMAL LOW (ref >60)
Glucose, Bld: 132 mg/dL — ABNORMAL HIGH (ref 70–99)
Potassium: 4.2 mmol/L (ref 3.5–5.1)
Sodium: 133 mmol/L — ABNORMAL LOW (ref 135–145)

## 2021-04-06 LAB — POCT I-STAT EG7
Acid-Base Excess: 1 mmol/L (ref 0.0–2.0)
Acid-Base Excess: 1 mmol/L (ref 0.0–2.0)
Acid-Base Excess: 2 mmol/L (ref 0.0–2.0)
Bicarbonate: 25.4 mmol/L (ref 20.0–28.0)
Bicarbonate: 25.7 mmol/L (ref 20.0–28.0)
Bicarbonate: 26.7 mmol/L (ref 20.0–28.0)
Calcium, Ion: 1.02 mmol/L — ABNORMAL LOW (ref 1.15–1.40)
Calcium, Ion: 1.04 mmol/L — ABNORMAL LOW (ref 1.15–1.40)
Calcium, Ion: 1.14 mmol/L — ABNORMAL LOW (ref 1.15–1.40)
HCT: 25 % — ABNORMAL LOW (ref 36.0–46.0)
HCT: 26 % — ABNORMAL LOW (ref 36.0–46.0)
HCT: 28 % — ABNORMAL LOW (ref 36.0–46.0)
Hemoglobin: 8.5 g/dL — ABNORMAL LOW (ref 12.0–15.0)
Hemoglobin: 8.8 g/dL — ABNORMAL LOW (ref 12.0–15.0)
Hemoglobin: 9.5 g/dL — ABNORMAL LOW (ref 12.0–15.0)
O2 Saturation: 75 %
O2 Saturation: 76 %
O2 Saturation: 79 %
Potassium: 3.6 mmol/L (ref 3.5–5.1)
Potassium: 3.7 mmol/L (ref 3.5–5.1)
Potassium: 3.9 mmol/L (ref 3.5–5.1)
Sodium: 135 mmol/L (ref 135–145)
Sodium: 136 mmol/L (ref 135–145)
Sodium: 137 mmol/L (ref 135–145)
TCO2: 27 mmol/L (ref 22–32)
TCO2: 27 mmol/L (ref 22–32)
TCO2: 28 mmol/L (ref 22–32)
pCO2, Ven: 38.6 mmHg — ABNORMAL LOW (ref 44–60)
pCO2, Ven: 38.8 mmHg — ABNORMAL LOW (ref 44–60)
pCO2, Ven: 40.7 mmHg — ABNORMAL LOW (ref 44–60)
pH, Ven: 7.425 (ref 7.25–7.43)
pH, Ven: 7.425 (ref 7.25–7.43)
pH, Ven: 7.433 — ABNORMAL HIGH (ref 7.25–7.43)
pO2, Ven: 39 mmHg (ref 32–45)
pO2, Ven: 40 mmHg (ref 32–45)
pO2, Ven: 42 mmHg (ref 32–45)

## 2021-04-06 LAB — CBC
HCT: 28 % — ABNORMAL LOW (ref 36.0–46.0)
Hemoglobin: 8.6 g/dL — ABNORMAL LOW (ref 12.0–15.0)
MCH: 20.9 pg — ABNORMAL LOW (ref 26.0–34.0)
MCHC: 30.7 g/dL (ref 30.0–36.0)
MCV: 68.1 fL — ABNORMAL LOW (ref 80.0–100.0)
Platelets: 333 10*3/uL (ref 150–400)
RBC: 4.11 MIL/uL (ref 3.87–5.11)
RDW: 21.5 % — ABNORMAL HIGH (ref 11.5–15.5)
WBC: 7.7 10*3/uL (ref 4.0–10.5)
nRBC: 0 % (ref 0.0–0.2)

## 2021-04-06 LAB — CYCLIC CITRUL PEPTIDE ANTIBODY, IGG/IGA: CCP Antibodies IgG/IgA: 7 units (ref 0–19)

## 2021-04-06 SURGERY — RIGHT HEART CATH
Anesthesia: LOCAL

## 2021-04-06 MED ORDER — HEPARIN (PORCINE) IN NACL 1000-0.9 UT/500ML-% IV SOLN
INTRAVENOUS | Status: DC | PRN
  Administered 2021-04-06: 500 mL

## 2021-04-06 MED ORDER — ENOXAPARIN SODIUM 30 MG/0.3ML IJ SOSY: 30.0000 mg | PREFILLED_SYRINGE | INTRAMUSCULAR | Status: DC

## 2021-04-06 MED ORDER — HEPARIN (PORCINE) IN NACL 1000-0.9 UT/500ML-% IV SOLN
INTRAVENOUS | Status: AC
  Filled 2021-04-06: qty 500

## 2021-04-06 MED ORDER — LIDOCAINE HCL (PF) 1 % IJ SOLN
INTRAMUSCULAR | Status: AC
  Filled 2021-04-06: qty 30

## 2021-04-06 MED ORDER — HYDRALAZINE HCL 20 MG/ML IJ SOLN: 10.0000 mg | INTRAMUSCULAR | Status: AC | PRN

## 2021-04-06 MED ORDER — LABETALOL HCL 5 MG/ML IV SOLN: 10.0000 mg | INTRAVENOUS | Status: AC | PRN

## 2021-04-06 MED ORDER — SODIUM CHLORIDE 0.9% FLUSH: 3.0000 mL | INTRAVENOUS | Status: DC | PRN

## 2021-04-06 MED ORDER — ENOXAPARIN SODIUM 40 MG/0.4ML IJ SOSY: 40.0000 mg | PREFILLED_SYRINGE | INTRAMUSCULAR | Status: DC

## 2021-04-06 MED ORDER — LIDOCAINE HCL (PF) 1 % IJ SOLN
INTRAMUSCULAR | Status: DC | PRN
  Administered 2021-04-06: 2 mL

## 2021-04-06 MED ORDER — ACETAMINOPHEN 325 MG PO TABS: 650.0000 mg | ORAL_TABLET | ORAL | Status: DC | PRN

## 2021-04-06 MED ORDER — SODIUM CHLORIDE 0.9 % IV SOLN: 250.0000 mL | INTRAVENOUS | Status: DC | PRN

## 2021-04-06 MED ORDER — SODIUM CHLORIDE 0.9% FLUSH: 3.0000 mL | Freq: Two times a day (BID) | INTRAVENOUS | Status: DC

## 2021-04-06 MED ORDER — ONDANSETRON HCL 4 MG/2ML IJ SOLN: 4.0000 mg | Freq: Four times a day (QID) | INTRAMUSCULAR | Status: DC | PRN

## 2021-04-06 SURGICAL SUPPLY — 4 items
CATH BALLN WEDGE 5F 110CM (CATHETERS) ×1 IMPLANT
PACK CARDIAC CATHETERIZATION (CUSTOM PROCEDURE TRAY) ×2 IMPLANT
SHEATH GLIDE SLENDER 4/5FR (SHEATH) ×1 IMPLANT
TRANSDUCER W/STOPCOCK (MISCELLANEOUS) ×2 IMPLANT

## 2021-04-06 NOTE — Interval H&P Note (Signed)
History and Physical Interval Note: ? ?04/06/2021 ?7:44 AM ? ?Jacqueline Harris  has presented today for surgery, with the diagnosis of heart failure.  The various methods of treatment have been discussed with the patient and family. After consideration of risks, benefits and other options for treatment, the patient has consented to  Procedure(s): ?RIGHT HEART CATH (N/A) as a surgical intervention.  The patient's history has been reviewed, patient examined, no change in status, stable for surgery.  I have reviewed the patient's chart and labs.  Questions were answered to the patient's satisfaction.   ? ? ?Prithvi Kooi ? ? ?

## 2021-04-06 NOTE — Plan of Care (Signed)
?  Problem: Clinical Measurements: ?Goal: Respiratory complications will improve ?Outcome: Progressing ?Goal: Cardiovascular complication will be avoided ?Outcome: Progressing ?  ?Problem: Activity: ?Goal: Risk for activity intolerance will decrease ?Outcome: Progressing ?  ?Problem: Coping: ?Goal: Level of anxiety will decrease ?Outcome: Progressing ?  ?Problem: Elimination: ?Goal: Will not experience complications related to urinary retention ?Outcome: Progressing ?  ?Problem: Pain Managment: ?Goal: General experience of comfort will improve ?Outcome: Progressing ?  ?Problem: Safety: ?Goal: Ability to remain free from injury will improve ?Outcome: Progressing ?  ?Problem: Skin Integrity: ?Goal: Risk for impaired skin integrity will decrease ?Outcome: Progressing ?  ?

## 2021-04-06 NOTE — Progress Notes (Signed)
Mobility Specialist Progress Note  ? ? 04/06/21 1500  ?Mobility  ?Activity Refused mobility  ? ?Pt stated she just got up.  ? ?Hildred Alamin ?Mobility Specialist  ?  ?

## 2021-04-06 NOTE — Progress Notes (Signed)
Pt ambulated in hallway 288 ft. with rolling walker maintaining O2 sat of 91-94% on RA. Pt started complain of SOB once back in the room/bathroom. When pt returned to bed after the bathroom, RA O2 sat dropped to 80% then immediately came up to 86%. Pt [placed on 2L nasal cannula. O2 sat came up to 92%.  ? ?

## 2021-04-06 NOTE — Progress Notes (Signed)
?  Echocardiogram ?2D Echocardiogram has been performed. ? ?Jacqueline Harris ?04/06/2021, 5:37 PM ?

## 2021-04-06 NOTE — Progress Notes (Signed)
? ? Advanced Heart Failure Rounding Note ? ?PCP-Cardiologist: Rozann Lesches, MD  ?[facility]: Dr. Haroldine Laws  ? ?Subjective:   ? ?1.4L in UOP charted + 1 unmeasured void w/ IV Lasix 80 mg x 2  ? ?S/p L thoracentesis 3/20 w/ 850 mL straw colored fluid removal.  Pleural fluid studies are concerning for exudative effusion based on total protein ratio and albumin gradients. GS negative, cultures negative so far, cytology pending.  ? ? ?Feels a bit better this am. Less SOB. No orthopnea or PND.  ? ?RHC this am  ? ?RA = 6 ?RV = 61/10 ?PA = 63/16 (37) ?PCW = 9 ?Fick cardiac output/index = 9.1/5.7 ?PVR = 3.1 WU ?FA sat = 95% ?PA sat = 75%, 76% ?SVC sat = 79%  ?PAPi = 7.8  ?  ? ?Objective:   ?Weight Range: ?56.5 kg ?Body mass index is 21.38 kg/m?.  ? ?Vital Signs:   ?Temp:  [97.7 ?F (36.5 ?C)-98 ?F (36.7 ?C)] 98 ?F (36.7 ?C) (03/22 1245) ?Pulse Rate:  [81-92] 92 (03/22 0634) ?Resp:  [16-21] 21 (03/22 8099) ?BP: (103-135)/(41-55) 119/41 (03/22 8338) ?SpO2:  [86 %-96 %] 96 % (03/22 0634) ?Weight:  [56.5 kg] 56.5 kg (03/22 0510) ?Last BM Date : 04/02/21 ? ?Weight change: ?Filed Weights  ? 04/03/21 1855 04/05/21 0421 04/06/21 0510  ?Weight: 56.2 kg 56.3 kg 56.5 kg  ? ? ?Intake/Output:  ? ?Intake/Output Summary (Last 24 hours) at 04/06/2021 0810 ?Last data filed at 04/06/2021 0540 ?Gross per 24 hour  ?Intake 180 ml  ?Output 740 ml  ?Net -560 ml  ? ?  ? ? ?Physical Exam  ? ?General:  Weak appearing. No resp difficulty ?HEENT: normal ?Neck: supple. no JVD. Carotids 2+ bilat; no bruits. No lymphadenopathy or thryomegaly appreciated. ?Cor: PMI nondisplaced. Regular rate & rhythm. 2/6 TR ?Lungs: clear ?Abdomen: soft, nontender, nondistended. No hepatosplenomegaly. No bruits or masses. Good bowel sounds. ?Extremities: no cyanosis, clubbing, rash, edema ?Neuro: alert & orientedx3, cranial nerves grossly intact. moves all 4 extremities w/o difficulty. Affect pleasant ? ? ?Telemetry  ? ?NSR 80-90s Personally reviewed ? ? ?Labs  ?  ?CBC ?Recent  Labs  ?  04/04/21 ?2505 04/05/21 ?3976 04/06/21 ?7341  ?WBC 5.8 8.3 7.7  ?NEUTROABS 3.8  --   --   ?HGB 7.4* 9.1* 8.6*  ?HCT 25.3* 29.1* 28.0*  ?MCV 67.1* 67.2* 68.1*  ?PLT 308 333 333  ? ? ?Basic Metabolic Panel ?Recent Labs  ?  04/04/21 ?9379 04/05/21 ?0240 04/06/21 ?9735  ?NA 137 133* 133*  ?K 3.8 4.0 4.2  ?CL 102 99 99  ?CO2 _0 ?GLUCOSE 109* 138* 132*  ?BUN 23 24* 29*  ?CREATININE 1.30* 1.42* 1.46*  ?CALCIUM 8.3* 8.1* 8.0*  ?MG 1.6* 2.7*  --   ? ? ?Liver Function Tests ?Recent Labs  ?  04/03/21 ?1930 04/04/21 ?0840  ?AST 10* 13*  ?ALT 6 9  ?ALKPHOS 60 51  ?BILITOT 0.6 0.7  ?PROT 6.6 5.6*  ?ALBUMIN 3.1* 2.3*  ? ? ?No results for input(s): LIPASE, AMYLASE in the last 72 hours. ?Cardiac Enzymes ?No results for input(s): CKTOTAL, CKMB, CKMBINDEX, TROPONINI in the last 72 hours. ? ?BNP: ?BNP (last 3 results) ?Recent Labs  ?  12/27/20 ?1546 04/03/21 ?1930 04/05/21 ?0242  ?BNP 372.0* 252.9* 231.6*  ? ? ? ?ProBNP (last 3 results) ?No results for input(s): PROBNP in the last 8760 hours. ? ? ?D-Dimer ?Recent Labs  ?  04/03/21 ?1930  ?DDIMER 1.40*  ? ? ?  Hemoglobin A1C ?No results for input(s): HGBA1C in the last 72 hours. ?Fasting Lipid Panel ?No results for input(s): CHOL, HDL, LDLCALC, TRIG, CHOLHDL, LDLDIRECT in the last 72 hours. ?Thyroid Function Tests ?No results for input(s): TSH, T4TOTAL, T3FREE, THYROIDAB in the last 72 hours. ? ?Invalid input(s): FREET3 ? ?Other results: ? ? ?Imaging  ? ? ?CARDIAC CATHETERIZATION ? ?Result Date: 04/06/2021 ?Findings: RA = 6 RV = 61/10 PA = 63/16 (37) PCW = 9 Fick cardiac output/index = 9.1/5.7 PVR = 3.1 WU FA sat = 95% PA sat = 75%, 76% SVC sat = 79% PAPi = 7.8 Assessment: 1. Mild to moderate PAH with high output 2. Normal left-sided filling pressures 3. No evidence of intracardiac shunting Plan/Discussion: PA pressures significantly improved since previous. There is a component of high output PAH. Glori Bickers, MD 8:06 AM  ? ? ?Medications:   ? ? ?Scheduled  Medications: ? [MAR Hold] angioplasty book   Does not apply Once  ? [MAR Hold] citalopram  20 mg Oral Daily  ? [MAR Hold] feeding supplement  237 mL Oral TID BM  ? [MAR Hold] fluconazole  400 mg Oral Daily  ? [MAR Hold] macitentan  10 mg Oral Daily  ? [MAR Hold] mouth rinse  15 mL Mouth Rinse BID  ? [MAR Hold] multivitamin with minerals  1 tablet Oral Daily  ? [MAR Hold] pantoprazole  40 mg Oral BID  ? [MAR Hold] sildenafil  20 mg Oral TID  ? [MAR Hold] sodium chloride flush  3 mL Intravenous Q12H  ? sodium chloride flush  3 mL Intravenous Q12H  ? ? ?Infusions: ? [MAR Hold] sodium chloride    ? sodium chloride    ? sodium chloride 10 mL/hr at 04/06/21 0012  ? ? ?PRN Medications: ?[MAR Hold] sodium chloride, sodium chloride, [MAR Hold] acetaminophen, [MAR Hold] albuterol, [MAR Hold] ALPRAZolam, Heparin (Porcine) in NaCl, [MAR Hold] hydrOXYzine, lidocaine (PF), [MAR Hold] ondansetron (ZOFRAN) IV, [MAR Hold] sodium chloride flush, sodium chloride flush ? ? ? ?Patient Profile  ? ?76 y.o. female with history of PAH, chronic diastolic CHF with RV failure, pericardial effusion, IDA, carotid artery stenosis. Admitted with acute respiratory failure with hypoxia 2/2 acute on chronic CHF and b/l pleural effusions. ? ?Echo: EF 55% with mildly D-shaped septum; RV mildly dysfunctional and mildly enlarged, mild-moderate TR, moderate pericardial effusion (smaller than prior), IVC dilated.  ? ?CTA chest showed no PE, moderate/large left and moderate right pleural effusions.  ? ?Assessment/Plan  ? ?1. PAH ?- Echo 03/31/20 EF 60-65% Mildly dilated RV with mild RV dysfunction. Moderate TR. RVSP 27mHG. Moderate pericardial effusion  ?- hx of acute left peroneal vein DVT although no pulmonary embolus by chest CTA in November 2021.  ?- VQ scan negative for CTEPH ?- RHC 04/20/20 PA 68/22 (41), moderate PAH w/normal wedge of 3.  Selective lower lobe R and L pulmonary angio mild distal pruning but no clot ?- Echo 12/15/20 EF 60-65% Mild LVH  G1DD. D-shaped septum RV dilated. Mild to moderately HK. RVSP 732mG. Large pericardial effusion (no tamponade) ?- RHC 12/22 RA 11 PA 85/28 (48) PCW = 14 Fick 7.3/4.6 PVR = 4.6 WU ?- PFTs 12/22 FEV 1.18 (54%) FVC 1.50 (51%) DLCO 45% ?- RHC today 04/06/21 RA 6 PA 63/16 (37) PCW = 9 Fick 9.1/5.7 PVR = 3.1WU ?-? ILD on CT chest 02/23 - seen by Pulmonary who did not think significant ILD ?- WHO group 1 & 3  ?- CTD serology negative. She has telegectasias  on her hands. Repeat CTD labs in process. All negative except for ANA screen (reflex pending) ?- Macitentan re-added in 12/22. Sildenafil 20 mg TID added back a few days ago ?- Now admitted w/ acute respiratory failure and volume overload. CTA chest showed no PE, moderate/large left and moderate right pleural effusions.  ?- Diuresed w/ IV Lasix (see below) ?- RHC with mild to moderate PAH with mildly elevated PVR and high output ?- Continue current regimen. Check echo with bubble study to further evaluate for shunt physiology. No evidence of large vascular shunt on CT. Suspect AVMs. Positivie response to vasodilators argues against PVOD ?  ?2. Pericardial effusion ?- moderate by echo 3/22 ?- large by echo 11/22. No tamponade. ?- moderate on CTA in ED ?- 2D echo this admit showed small to moderate pericardial effusion (smaller than prior), no tamponade   ?- likely due to elevated coronary sinus/RA pressures in setting of PAH ?  ?3. Chronic Diastolic CHF with RV failure ?- Echo 12/15/20 EF 60-65% Mild LVH G1DD. D-shaped septum RV dilated. Mild to moderately HK. RVSP 62mHG. Large pericardial effusion (no tamponade) ?- Volume overloaded on admit. NYHA IIIb symptoms.  ?- Echo this admit showed EF 55% with mildly D-shaped septum; RV mildly dysfunctional and mildly enlarged, mild-moderate TR, moderate pericardial effusion (smaller than prior), IVC dilated.  ?- Has diuresed with IV lasix, now w/ AKI. Volume ok on RHC today ?- No spiro d/t history of rash ?- Consider SGLT2i  prior to discharge. ? ?  ?4. Severe microcytic Anemia, Iron Deficiency Anemia ?- EGD 12/22: Candida esophagitis, single gastric erosis with stigmata of recent bleeding, RFA of gastric antral vascular ectasia ?- EGD 0

## 2021-04-06 NOTE — Discharge Summary (Signed)
?Physician Discharge Summary ?  ?Patient: Jacqueline Harris MRN: [id number] DOB: 1945-02-19  ?Admit date:     04/03/2021  ?Discharge date: 04/06/21  ?Discharge Physician: Patrecia Pour  ? ?PCP: Glenda Chroman, MD  ? ?Recommendations at discharge:  ?Follow up with heart failure team after discharge.  ?Follow up results of echo with bubble study.  ?Follow up with pulmonary, Dr. Elsworth Soho after discharge.  ?Follow up CTD panel which again confirms positive ANA, also appears to have positive Anti-Ro and anti-La Ab's. Consider rheumatology referral. ?Follow up with GI as scheduled next week.  ?Recommend recheck CBC and BMP at follow up.  ? ?Discharge Diagnoses: ?Principal Problem: ?  Acute diastolic CHF (congestive heart failure) ([place]) ?Active Problems: ?  Acute respiratory failure with hypoxia ([place]) ?  Bilateral pleural effusion - L >> R ?  AKI (acute kidney injury) ([place]) ?  Pericardial effusion ?  RVF (right ventricular failure) ([place]) ?  Primary pulmonary hypertension ([place]) ?  ILD (interstitial lung disease) ([place]) ?  Hilar adenopathy ?  Iron deficiency anemia ?  Acute esophagitis ?  GAVE (gastric antral vascular ectasia) ?  Wheezing ? ?Hospital Course: ?Jacqueline Harris is a 76 y.o. female with a history of right heart failure, PAH, ILD, pericardial effusion who presented to the ED 3/19 with worsening shortness of breath despite increasing lasix as recommended by Dr. Haroldine Laws. She was hypoxic, afebrile, with elevated BNP (252), normal WBC (8.2k), elevated d-dimer (1.4) with subsequent CTA chest showing no PE. There is shotty lymphadenopathy, large left, and moderate right, pleural effusions that appeared to be free-flowing and an enlarged pericardial effusion. BP 122/46, HR 88. IV lasix was given, cardiology consulted, and the patient admitted. Echo actually showed moderate, decreased volume pericardial effusion. Thoracentesis performed 8/20 yielding 850cc straw-colored exudate drained from left pleural space. This does meet  exudative criteria, though cytology is negative and there are no infectious symptoms. PCCM was consulted and felt that the patient does not have a significant pulmonary component to pulmonary HTN. Heart failure team followed the patient, guided diuresis which has been held with AKI. RHC on 3/22 showed improved pressures. Diuretics were recommended to restart the following day and she was cleared for discharge. The patient feels improved, though remains hypoxic and will be discharged with supplemental oxygen. ? ?Assessment and Plan: ?* Acute diastolic CHF (congestive heart failure) ([place]) ?Cardiology guiding diuresis, holding further lasix today, can restart tomorrow based on improved pressures at [place] this AM. ? ?AKI (acute kidney injury) ([place]) ?Recommend recheck at follow up. Hold diuretic and potassium supplementation for now. ? ?Bilateral pleural effusion - L >> R ?s/p thoracentesis on left concerning for exudate w/negative GS, culture and cytology.  ?- Hypoxic at this time. If continues to be hypoxic, may consider right-side thoracentesis for Dx and Tx purposes.  ? ?Acute respiratory failure with hypoxia ([place]) ?Due to [facility] and acute on chronic HFpEF with pleural effusions.  ?- Continue supplemental oxygen at discharge. Pt is optimized for discharge at this time and will continue following up with pulmonary and cardiology. ? ?Hilar adenopathy ?- Defer further evaluation to pulmonary team. ? ?ILD (interstitial lung disease) ([place]) ?Followed by Dr. Elsworth Soho as outpatient.  ?- PCCM consulted here. ? ?Primary pulmonary hypertension ([place]) ?- Continue sildenafil, macitentan.  ?- CTD work up only +ANA in past, positive again this admit with +anti-Ro and anti-La Ab's as well. Consider rheumatology referral. ? ?RVF (right ventricular failure) ([place]) ?Cardiology assessment appreciated. See RHC findings copied below:  ?  RA = 6 ?RV = 61/10 ?PA = 63/16 (37) ?PCW = 9 ?Fick cardiac output/index = 9.1/5.7 ?PVR = 3.1 WU ?FA sat = 95% ?PA  sat = 75%, 76% ?SVC sat = 79%  ?PAPi = 7.8  ? ?Pericardial effusion ?Echo reveals smaller effusion, no tamponade at this time. No drainage recommended. ? ?Wheezing ?with history of tobacco use ?- prn albuterol ? ?GAVE (gastric antral vascular ectasia) ?Work up by EGD most recently 03/14/2021 by Dr. Lorenso Courier revealed improving esophagitis and GAVE without active bleeding as well as duodenal ulcer.  ?- PPI and NSAID avoidance were recommended which we will continue here.  ?- Recheck CBC at follow up. Has GI follow up scheduled. ? ?Acute esophagitis ?Continue 14 days diflucan started PTA. ? ?Iron deficiency anemia ?Due to chronic GI blood loss. Work up by EGD most recently 03/14/2021 by Dr. Lorenso Courier revealed improving esophagitis and GAVE without active bleeding as well as duodenal ulcer. PPI and NSAID avoidance were recommended which we will continue here.  ?- Iron stores severely low (ferritin 9, 4% sat, iron 13, TIBC 340). Given IV iron 3/20. Continue monitoring and treating as outpatient. Consider hematology evaluation. ?- Hgb improved s/p 1u PRBCs 3/20. Will continue monitoring at follow up. No active bleeding at this time. ? ? ?Consultants: Cardiology/HF team, PCCM ?Procedures performed: Left horacentesis, RHC  ?Disposition: Home ?Diet recommendation:  ?Discharge Diet Orders (From admission, onward)  ? ?  Start     Ordered  ? 04/06/21 0000  Diet - low sodium heart healthy       ? 04/06/21 1130  ? ?  ?  ? ?  ? ?Cardiac diet ?DISCHARGE MEDICATION: ?Allergies as of 04/06/2021   ? ?   Reactions  ? Sildenafil   ? Blurry vision with large doses  ? Spironolactone Rash  ? ?  ? ?  ?Medication List  ?  ? ?TAKE these medications   ? ?acetaminophen 650 MG CR tablet ?Commonly known as: TYLENOL ?Take 650 mg by mouth every 8 (eight) hours as needed for pain. ?  ?ALPRAZolam 0.5 MG tablet ?Commonly known as: Duanne Moron ?Take 0.5 mg by mouth at bedtime as needed for anxiety. ?  ?citalopram 20 MG tablet ?Commonly known as: CELEXA ?Take 20 mg  by mouth daily. ?  ?fluconazole 200 MG tablet ?Commonly known as: DIFLUCAN ?Take 2 tablets (400 mg total) by mouth daily for 14 days. ?  ?furosemide 20 MG tablet ?Commonly known as: LASIX ?TAKE 1 TABLET BY MOUTH EVERY DAY ?What changed:  ?how much to take ?how to take this ?when to take this ?  ?hydrOXYzine 25 MG capsule ?Commonly known as: VISTARIL ?Take 25 mg by mouth at bedtime as needed (sleep). ?  ?multivitamin with minerals tablet ?Take 1 tablet by mouth daily. ?  ?omeprazole 40 MG capsule ?Commonly known as: PRILOSEC ?Take 1 capsule (40 mg total) by mouth 2 (two) times daily. ?  ?Opsumit 10 MG tablet ?Generic drug: macitentan ?Take 1 tablet (10 mg total) by mouth daily. ?  ?potassium chloride 10 MEQ tablet ?Commonly known as: KLOR-CON ?TAKE 1 TABLET BY MOUTH EVERY DAY ?  ?sildenafil 20 MG tablet ?Commonly known as: REVATIO ?Take 1 tablet (20 mg total) by mouth 3 (three) times daily. ?  ? ?  ? ?  ?  ? ? ?  ?Durable Medical Equipment  ?(From admission, onward)  ?  ? ? ?  ? ?  Start     Ordered  ? 04/06/21 1122  DME  Oxygen  Once       ?Question Answer Comment  ?Length of Need 6 Months   ?Mode or (Route) Nasal cannula   ?Liters per Minute 2   ?Frequency Continuous (stationary and portable oxygen unit needed)   ?Oxygen delivery system Gas   ?  ? 04/06/21 1130  ? ?  ?  ? ?  ? ? Follow-up Information   ? ? Glenda Chroman, MD Follow up.   ?Specialty: Internal Medicine ?Contact information: ?[address] ?[place] [address] ?[phone] ? ? ?  ?  ? ? Bensimhon, Shaune Pascal, MD Follow up on 04/20/2021.   ?Specialty: Cardiology ?Why: Advanced Heart Failure Clinic at [facility] 9 am ?Entrance C, Garage [address] ?Contact information: ?[address] ?[address] ?[place] [address] ?[phone] ? ? ?  ?  ? ? Rigoberto Noel, MD Follow up on 05/17/2021.   ?Specialty: Pulmonary Disease ?Contact information: ?[address] ?[address] ?[place] [address] ?[phone] ? ? ?  ?  ? ? Sharyn Creamer, MD Follow up.   ?Specialty:  Gastroenterology ?Contact information: ?[address] ?[address] ?[place] [address] ?[phone] ? ? ?  ?  ? ?  ?  ? ?  ? ?Feels very tired, couldn't sleep with interruptions overnight, RHC very early this

## 2021-04-07 DIAGNOSIS — I5031 Acute diastolic (congestive) heart failure: Secondary | ICD-10-CM | POA: Diagnosis not present

## 2021-04-07 DIAGNOSIS — I270 Primary pulmonary hypertension: Secondary | ICD-10-CM | POA: Diagnosis not present

## 2021-04-07 NOTE — TOC CM/SW Note (Addendum)
04/07/2020 834 am Pt dc home on 04/06/2021, received message from Unit RN that pt needed oxygen. [facility] [facility] with referral for home oxygen. States they will deliver to pt's home. Contacted pt to make aware. Received call from [place] and pt did not have [place] orders. [facility] rep will reach out to pt's PCP for [facility] PT orders. Jonnie Finner RN3 CCM, Heart Failure TOC CM [phone]  ?

## 2021-04-07 NOTE — TOC CM/SW Note (Addendum)
SATURATION QUALIFICATIONS: (This note is used to comply with regulatory documentation for home oxygen) ? ?Patient Saturations on Room Air at Rest = 91% ? ?Patient Saturations on Room Air while Ambulating = 80% ? ?Patient Saturations on 2 Liters of oxygen while Ambulating = 92% ? ?Please briefly explain why patient needs home oxygen: PAH, CHF ? ?Jonnie Finner RN3 CCM, Heart Failure TOC CM [phone]  ?

## 2021-04-09 LAB — CULTURE, BODY FLUID W GRAM STAIN -BOTTLE: Culture: NO GROWTH

## 2021-04-11 ENCOUNTER — Encounter (HOSPITAL_COMMUNITY): Payer: Self-pay | Admitting: Internal Medicine

## 2021-04-11 ENCOUNTER — Ambulatory Visit (INDEPENDENT_AMBULATORY_CARE_PROVIDER_SITE_OTHER): Payer: Medicare HMO | Admitting: Physician Assistant

## 2021-04-11 ENCOUNTER — Other Ambulatory Visit (INDEPENDENT_AMBULATORY_CARE_PROVIDER_SITE_OTHER): Payer: Medicare HMO

## 2021-04-11 ENCOUNTER — Encounter: Payer: Self-pay | Admitting: Physician Assistant

## 2021-04-11 VITALS — BP 136/50 | HR 92 | Ht 64.0 in | Wt 124.6 lb

## 2021-04-11 DIAGNOSIS — D509 Iron deficiency anemia, unspecified: Secondary | ICD-10-CM

## 2021-04-11 DIAGNOSIS — K31819 Angiodysplasia of stomach and duodenum without bleeding: Secondary | ICD-10-CM

## 2021-04-11 LAB — CBC WITH DIFFERENTIAL/PLATELET
Basophils Absolute: 0.1 10*3/uL (ref 0.0–0.1)
Basophils Relative: 1.1 % (ref 0.0–3.0)
Eosinophils Absolute: 0.2 10*3/uL (ref 0.0–0.7)
Eosinophils Relative: 2.2 % (ref 0.0–5.0)
HCT: 30 % — ABNORMAL LOW (ref 36.0–46.0)
Hemoglobin: 9.5 g/dL — ABNORMAL LOW (ref 12.0–15.0)
Lymphocytes Relative: 11.2 % — ABNORMAL LOW (ref 12.0–46.0)
Lymphs Abs: 0.9 10*3/uL (ref 0.7–4.0)
MCHC: 31.5 g/dL (ref 30.0–36.0)
MCV: 67.6 fl — ABNORMAL LOW (ref 78.0–100.0)
Monocytes Absolute: 0.9 10*3/uL (ref 0.1–1.0)
Monocytes Relative: 11.2 % (ref 3.0–12.0)
Neutro Abs: 5.8 10*3/uL (ref 1.4–7.7)
Neutrophils Relative %: 74.3 % (ref 43.0–77.0)
Platelets: 408 10*3/uL — ABNORMAL HIGH (ref 150.0–400.0)
RBC: 4.44 Mil/uL (ref 3.87–5.11)
RDW: 24.4 % — ABNORMAL HIGH (ref 11.5–15.5)
WBC: 7.9 10*3/uL (ref 4.0–10.5)

## 2021-04-11 LAB — IBC PANEL
Iron: 20 ug/dL — ABNORMAL LOW (ref 42–145)
Saturation Ratios: 7.6 % — ABNORMAL LOW (ref 20.0–50.0)
TIBC: 261.8 ug/dL (ref 250.0–450.0)
Transferrin: 187 mg/dL — ABNORMAL LOW (ref 212.0–360.0)

## 2021-04-11 NOTE — Progress Notes (Signed)
? ?Chief Complaint: Follow-up GERD ? ?HPI: ?   Jacqueline Harris is a 76 year old female with a past medical history of DVT, CHF, GAVE, interstitial lung disease and iron deficiency anemia and others listed below, known to Dr. Lorenso Courier, who was referred to me by Glenda Chroman, MD for follow-up of GERD. ?   12/23/2020 patient seen in clinic by Dr. Lorenso Courier for iron deficiency anemia.  At that time scheduled for an EGD at [facility] due to history of pulmonary hypertension and a finding of GAVE on previous EGD in 2012. ?   Most recent EGD 03/14/2021 with white nummular lesions in the esophagus, small hiatal hernia, GAVE and gastritis as well as duodenal ulcers.  At that time it was noted that patient's GAVE was much improved from prior however there were new duodenal ulcers which had developed.  Recommend she stay on Omeprazole 40 p.o. twice daily x12 weeks and avoid NSAIDs.  Patient told to follow-up in a month. ?   04/03/2021 patient admitted to the hospital for acute diastolic CHF.  Found to have AKI and bilateral pleural effusions as well as acute respiratory failure with hypoxia.  Patient also found to have acute esophagitis and continued on 14 days of Diflucan.  Iron studies during hospitalization showed a ferritin low at 9, percent saturation 4, iron 13, TIBC 340, she was given IV iron on 3/20.  Also given 1 unit PRBCs.  Hemoglobin 8.6 on discharge 3/22. ?   Today, the patient returns to clinic accompanied by her daughter.  She explains that she was just in the hospital as above and is still feeling not quite herself.  She does not have the energy level that she once did.  As far as her GI system she is doing well on the Omeprazole 40 mg twice daily and denies any abdominal pain, black stools, nausea or vomiting. ?   Denies fever, chills, weight loss or symptoms that awaken her from sleep. ?    ?Past Medical History:  ?Diagnosis Date  ? Acute deep vein thrombosis (DVT) of left peroneal vein (HCC)   ? November 2021  ? Anemia    ? Arthritis   ? CHF (congestive heart failure) ([place])   ? Colon polyps   ? Community acquired pneumonia 11/21/2019  ? Essential hypertension   ? GAVE (gastric antral vascular ectasia)   ? HLD (hyperlipidemia)   ? Interstitial lung disease ([place])   ? Pneumonia due to COVID-19 virus   ? November 2021  ? Prediabetes   ? Pulmonary hypertension ([place])   ? ? ?Past Surgical History:  ?Procedure Laterality Date  ? BIOPSY  01/03/2021  ? Procedure: BIOPSY;  Surgeon: Sharyn Creamer, MD;  Location: [facility];  Service: Gastroenterology;;  ? BIOPSY  03/14/2021  ? Procedure: BIOPSY;  Surgeon: Sharyn Creamer, MD;  Location: [facility];  Service: Gastroenterology;;  ? CHOLECYSTECTOMY    ? COLONOSCOPY N/A 12/03/2014  ? Procedure: COLONOSCOPY;  Surgeon: Rogene Houston, MD;  [facility];  Service: Endoscopy;  Laterality: N/A;  830  ? COLONOSCOPY WITH PROPOFOL N/A 03/10/2020  ? Procedure: COLONOSCOPY WITH PROPOFOL;  Surgeon: Rogene Houston, MD;  [facility];  Service: Endoscopy;  Laterality: N/A;  1:15  ? ESOPHAGOGASTRODUODENOSCOPY (EGD) WITH PROPOFOL N/A 01/03/2021  ? Procedure: ESOPHAGOGASTRODUODENOSCOPY (EGD) WITH PROPOFOL;  Surgeon: Sharyn Creamer, MD;  Location: [facility];  Service: Gastroenterology;  Laterality: N/A;  ? ESOPHAGOGASTRODUODENOSCOPY (EGD) WITH PROPOFOL N/A 03/14/2021  ? Procedure:  ESOPHAGOGASTRODUODENOSCOPY (EGD) WITH PROPOFOL;  Surgeon: Sharyn Creamer, MD;  Location: [facility];  Service: Gastroenterology;  Laterality: N/A;  ? HEMOSTASIS CLIP PLACEMENT  03/14/2021  ? Procedure: HEMOSTASIS CLIP PLACEMENT;  Surgeon: Sharyn Creamer, MD;  Location: [facility];  Service: Gastroenterology;;  ? IR THORACENTESIS ASP PLEURAL SPACE W/IMG GUIDE  04/04/2021  ? POLYPECTOMY  03/10/2020  ? Procedure: POLYPECTOMY;  Surgeon: Rogene Houston, MD;  [facility];  Service: Endoscopy;;  ? RADIOFREQUENCY ABLATION  01/03/2021  ? Procedure: RADIO FREQUENCY ABLATION;  Surgeon: Sharyn Creamer, MD;   Location: [facility];  Service: Gastroenterology;;  ? RIGHT HEART CATH N/A 04/20/2020  ? Procedure: RIGHT HEART CATH;  Surgeon: Jolaine Artist, MD;  Location: [facility];  Service: Cardiovascular;  Laterality: N/A;  ? RIGHT HEART CATH N/A 12/31/2020  ? Procedure: RIGHT HEART CATH;  Surgeon: Jolaine Artist, MD;  Location: [place] [facility];  Service: Cardiovascular;  Laterality: N/A;  ? RIGHT HEART CATH N/A 04/06/2021  ? Procedure: RIGHT HEART CATH;  Surgeon: Jolaine Artist, MD;  Location: [place] [facility];  Service: Cardiovascular;  Laterality: N/A;  ? ? ?Current Outpatient Medications  ?Medication Sig Dispense Refill  ? acetaminophen (TYLENOL) 650 MG CR tablet Take 650 mg by mouth every 8 (eight) hours as needed for pain.    ? ALPRAZolam (XANAX) 0.5 MG tablet Take 0.5 mg by mouth at bedtime as needed for anxiety.    ? citalopram (CELEXA) 20 MG tablet Take 20 mg by mouth daily.    ? fluconazole (DIFLUCAN) 200 MG tablet Take 2 tablets (400 mg total) by mouth daily for 14 days. 28 tablet 0  ? furosemide (LASIX) 20 MG tablet TAKE 1 TABLET BY MOUTH EVERY DAY (Patient taking differently: 2 (two) times daily.) 90 tablet 3  ? hydrOXYzine (VISTARIL) 25 MG capsule Take 25 mg by mouth at bedtime as needed (sleep).    ? macitentan (OPSUMIT) 10 MG tablet Take 1 tablet (10 mg total) by mouth daily. 30 tablet 11  ? Multiple Vitamins-Minerals (MULTIVITAMIN WITH MINERALS) tablet Take 1 tablet by mouth daily.    ? omeprazole (PRILOSEC) 40 MG capsule Take 1 capsule (40 mg total) by mouth 2 (two) times daily. 168 capsule 0  ? potassium chloride (KLOR-CON) 10 MEQ tablet TAKE 1 TABLET BY MOUTH EVERY DAY 90 tablet 3  ? sildenafil (REVATIO) 20 MG tablet Take 1 tablet (20 mg total) by mouth 3 (three) times daily. 90 tablet 11  ? ?No current facility-administered medications for this visit.  ? ? ?Allergies as of 04/11/2021 - Review Complete 04/03/2021  ?Allergen Reaction Noted  ? Sildenafil  04/05/2021  ?  Spironolactone Rash 03/08/2021  ? ? ?Family History  ?Problem Relation Age of Onset  ? Cancer - Lung Mother   ? Bladder Cancer Father   ? Diabetes Father   ? COPD Father   ? Hypertension Sister   ? Heart disease Sister   ? Other Sister   ?     GAVE  ? Diabetes Sister   ? Cirrhosis Sister   ? Other Sister   ?     GAVE  ? Cancer - Colon Brother   ? Diabetes Brother   ? Heart disease Brother   ? Thyroid disease Son   ? Depression Son   ? Anxiety disorder Son   ? Hyperlipidemia Son   ? ? ?Social History  ? ?Socioeconomic History  ? Marital status: Divorced  ?  Spouse name: Not on file  ? Number of children: 2  ? Years of education: Not on file  ? Highest education level: Not on file  ?Occupational History  ? Occupation: retired  ?Tobacco Use  ? Smoking status: Former  ?  Packs/day: 0.50  ?  Years: 13.00  ?  Pack years: 6.50  ?  Types: Cigarettes  ?  Quit date: 74  ?  Years since quitting: 38.2  ? Smokeless tobacco: Never  ?Vaping Use  ? Vaping Use: Never used  ?Substance and Sexual Activity  ? Alcohol use: No  ? Drug use: No  ? Sexual activity: Not on file  ?Other Topics Concern  ? Not on file  ?Social History Narrative  ? Not on file  ? ?Social Determinants of Health  ? ?Financial Resource Strain: Not on file  ?Food Insecurity: Not on file  ?Transportation Needs: Not on file  ?Physical Activity: Not on file  ?Stress: Not on file  ?Social Connections: Not on file  ?Intimate Partner Violence: Not on file  ? ? ?Review of Systems:    ?Constitutional: No weight loss, fever or chills ?Cardiovascular: No chest pain ?Respiratory: No SOB ?Gastrointestinal: See HPI and otherwise negative ? ? Physical Exam:  ?Vital signs: ?BP (!) 136/50   Pulse 92   Ht _0  (1.626 m)   Wt 124 lb 9.6 oz (56.5 kg)   BMI 21.39 kg/m?   ? ?Constitutional:   Pleasant elderly, frail appearing, Caucasian female appears to be in NAD, Well developed, Well nourished, alert and cooperative ?Respiratory: Respirations even and unlabored. Lungs clear to  auscultation bilaterally.   No wheezes, crackles, or rhonchi.  ?Cardiovascular: Normal S1, S2. No MRG. Regular rate and rhythm. No peripheral edema, cyanosis or pallor.  ?Gastrointestinal:  Soft, nondistended, no

## 2021-04-11 NOTE — Progress Notes (Signed)
I agree with the assessment and plan as outlined by Ms. Lemmon. Low threshold to refer to hematology for consideration of IV iron infusions. ?

## 2021-04-11 NOTE — Patient Instructions (Signed)
If you are age 74 or older, your body mass index should be between 23-30. Your Body mass index is 21.39 kg/m?Marland Kitchen If this is out of the aforementioned range listed, please consider follow up with your Primary Care Provider. ? ?If you are age 37 or younger, your body mass index should be between 19-25. Your Body mass index is 21.39 kg/m?Marland Kitchen If this is out of the aformentioned range listed, please consider follow up with your Primary Care Provider.  ? ?________________________________________________________ ? ?The [facility] providers would like to encourage you to use [facility] to communicate with providers for non-urgent requests or questions.  Due to long hold times on the telephone, sending your provider a message by [facility] may be a faster and more efficient way to get a response.  Please allow 48 business hours for a response.  Please remember that this is for non-urgent requests.  ?_______________________________________________________ ? ?Your provider has requested that you go to the basement level for lab work before leaving today. Press "B" on the elevator. The lab is located at the first door on the left as you exit the elevator. ? ?Continue omeprazole twice a day for 2 months then decrease to once a day ?

## 2021-04-12 ENCOUNTER — Other Ambulatory Visit (HOSPITAL_COMMUNITY): Payer: Self-pay | Admitting: *Deleted

## 2021-04-12 ENCOUNTER — Other Ambulatory Visit: Payer: Self-pay

## 2021-04-12 LAB — FERRITIN: Ferritin: 83.4 ng/mL (ref 10.0–291.0)

## 2021-04-12 MED ORDER — FERROUS SULFATE 325 (65 FE) MG PO TBEC: 325.0000 mg | DELAYED_RELEASE_TABLET | Freq: Every day | ORAL | 3 refills | Status: AC

## 2021-04-12 MED ORDER — TORSEMIDE 20 MG PO TABS: ORAL_TABLET | ORAL | 3 refills | Status: DC

## 2021-04-14 DIAGNOSIS — Z09 Encounter for follow-up examination after completed treatment for conditions other than malignant neoplasm: Secondary | ICD-10-CM | POA: Diagnosis not present

## 2021-04-14 DIAGNOSIS — E1165 Type 2 diabetes mellitus with hyperglycemia: Secondary | ICD-10-CM | POA: Diagnosis not present

## 2021-04-14 DIAGNOSIS — J849 Interstitial pulmonary disease, unspecified: Secondary | ICD-10-CM | POA: Diagnosis not present

## 2021-04-14 DIAGNOSIS — J9611 Chronic respiratory failure with hypoxia: Secondary | ICD-10-CM | POA: Diagnosis not present

## 2021-04-18 NOTE — Progress Notes (Signed)
? ?ADVANCED HF CLINIC NOTE ? ?Primary Care: Glenda Chroman, MD ?Primary Cardiologist: Rozann Lesches, MD ?HF Cardiologist: Dr. Haroldine Laws ? ?HPI: ?Jacqueline Harris is a 76 y.o. female with h/o HTN, tobacco use  referred by Dr. Domenic Polite for further evaluation of pulmonary HTN.  ? ?History includes COVID-19 in 9/21 (unvaccinated) than admitted to [facility] in 11/21 with PNA. Cardiology was involved in her care at that time with finding of a small pericardial effusion, also moderate RV dysfunction with severely elevated estimated RVSP of 68 mmHg. LVEF was normal.  It was suspected that this was related to acute pulmonary process at that point.  She also had an acute left peroneal vein DVT, treated with Eliquis.  Chest CTA at that time showed no pulmonary embolus. ? ?She saw Estella Husk in f/u. Echo 03/31/20 EF 60-65% Mildly dilated RV with mild RV dysfunction. Moderate TR. RVSP 12mHG. Moderate pericardial effusion  ? ?Had carotid u/s with Dr. VWoody Sellerbilateral 40-46% stenosis  ? ?Echo 12/15/20 EF 60-65% Mild LVH G1DD. D-shaped septum RV dilated. Mild to moderately HK. RVSP 745mG. Large pericardial effusion (no tamponade) ? 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 No personal or family h/o CTD. Does have Raynaud's.  ? ?RHGlenview Hillsn 4/22 ?RA = 4 ?RV = 67/4 ?PA = 68/22 (41) ?PCW = 3 ?Fick cardiac output/index = 7.3/4.4 ?Thermo CO/CI = 5.9/3.5 ?PVR = 5.2 (Fick) 6.5 (Thermo) ?Ao sat = 99% ?PA sat = 71%, 74% ?High SVC sat = 73% ?  ?Selective lower lobe R & L pulmonary angios: Mild distal pruning no evidence of clot ? ?Has been on sildenafil 20 tid. In 9/22 started on macitentan but stopped after two weeks due to bloating and worsening SOB. Also being treated for acute Fe-def anemia at the time. Has been following with GI (Dr. DoLorenso Courier  ? ?EGD 12/22: showed fungal esophagitis and gastric AVMs. ? ?RHBroome2/22  ?RA = 11 ?RV = 82/11 ?PA = 85/28 (48) ?PCW = 14 ?Fick cardiac output/index =  7.3/4.6 ?PVR = 4.6 WU ?Ao sat = 91% ?PA sat = 68%, 69% ?PaPI = 6.5 ? ?PFTs 12/22 ?FEV 1.18 (54%) ?FVC 1.50 (51%) ?DLCO 45% ? ?Seen for follow-up 03/28/21. O2 sats dropping 91%> 83% since EGD in 02/23. Appeared volume overloaded. Furosemide increased to 40 mg daily X 3 days (takes 20 mg daily). Sildenafil added back. Echo planned to reassess RV and pericardial effusion. ? ?Admitted 3/23 with acute respiratory failure with hypoxia 2/2 acute on chronic CHF and b/l pleural effusions. Echo showed EF 55% with mildly D-shaped septum; RV mildly reduced and enlarged, mild-moderate TR, moderate pericardial effusion (smaller than prior). Underwent L thoracentesis w/ 850 mL straw colored fluid removal. Fluid concerning for exudative effusion (GS, cultures negative, cytology negative for malignant cells).  She was diuresed with IV lasix and underwent RHC showing mild to moderate PAH with high output, normal left sided filling pressures, and no evidence of intracardiac shunting. Echo with bubble study, poor quality but positive for intrapulmonary shunting. Hospitalization c/b AKI and anemia, received PRBCs and IV iron.  GDMT titrated, and she was discharged home with supplemental oxygen, weight 124 lbs. ? ?Today she returns for post hospital HF follow up with her daughter. Overall feeling tired and weak. She has SOB with minimal exertion. Noticing more leg swelling. Lives alone, no recent falls. Has O2 tank at home. Denies abnormal bleeding, palpitations, CP, dizziness, or PND/Orthopnea. Appetite ok. No fever  or chills. Weight at home 124 pounds. Taking all medications. Had follow up with GI, iron and CBC re-checked and po iron suppl started.  ? ?Cardiac Studies: ?- Echo (3/23): EF 55% with mildly D-shaped septum; RV mildly reduced, mild-moderate TR, moderate pericardial effusion (smaller than prior). ? ?- Echo bubble study (3/23): poor quality, but study suggests intrapulmonary shunting with bubbles in LV, EF 60-65%, RV  moderately reduced ? ?- RHC (3/23):  ?RA = 6 ?RV = 61/10 ?PA = 63/16 (37) ?PCW = 9 ?Fick cardiac output/index = 9.1/5.7 ?PVR = 3.1 WU ?FA sat = 95% ?PA sat = 75%, 76% ?SVC sat = 79%  ?PAPi = 7.8  ?  ?1. Mild to moderate PAH with high output ?2. Normal left-sided filling pressures ?3. No evidence of intracardiac shunting ?  ?PA pressures significantly improved since previous. There is a component of high output PAH. ? ?Past Medical History:  ?Diagnosis Date  ? Acute deep vein thrombosis (DVT) of left peroneal vein (HCC)   ? November 2021  ? Anemia   ? Arthritis   ? CHF (congestive heart failure) ([place])   ? Colon polyps   ? Community acquired pneumonia 11/21/2019  ? Essential hypertension   ? GAVE (gastric antral vascular ectasia)   ? HLD (hyperlipidemia)   ? Interstitial lung disease ([place])   ? Pneumonia due to COVID-19 virus   ? November 2021  ? Prediabetes   ? Pulmonary hypertension ([place])   ? ?Current Outpatient Medications  ?Medication Sig Dispense Refill  ? acetaminophen (TYLENOL) 650 MG CR tablet Take 650 mg by mouth every 8 (eight) hours as needed for pain.    ? ALPRAZolam (XANAX) 0.5 MG tablet Take 0.5 mg by mouth at bedtime as needed for anxiety.    ? citalopram (CELEXA) 20 MG tablet Take 20 mg by mouth daily.    ? ferrous sulfate 325 (65 FE) MG EC tablet Take 1 tablet (325 mg total) by mouth daily. 30 tablet 3  ? hydrOXYzine (VISTARIL) 25 MG capsule Take 25 mg by mouth at bedtime as needed (sleep).    ? macitentan (OPSUMIT) 10 MG tablet Take 1 tablet (10 mg total) by mouth daily. 30 tablet 11  ? Multiple Vitamins-Minerals (MULTIVITAMIN WITH MINERALS) tablet Take 1 tablet by mouth daily.    ? omeprazole (PRILOSEC) 40 MG capsule Take 1 capsule (40 mg total) by mouth 2 (two) times daily. 168 capsule 0  ? potassium chloride (KLOR-CON) 10 MEQ tablet TAKE 1 TABLET BY MOUTH EVERY DAY (Patient taking differently: Take 10 mEq by mouth every other day.) 90 tablet 3  ? sildenafil (REVATIO) 20 MG tablet Take 1 tablet (20  mg total) by mouth 3 (three) times daily. 90 tablet 11  ? torsemide (DEMADEX) 20 MG tablet Take 65m daily for three days and then take 239mevery other day. (Patient taking differently: take 2053mvery other day.) 30 tablet 3  ? ?No current facility-administered medications for this encounter.  ? ?Allergies  ?Allergen Reactions  ? Sildenafil   ?  Blurry vision with large doses  ? Spironolactone Rash  ? ?Social History  ? ?Socioeconomic History  ? Marital status: Divorced  ?  Spouse name: Not on file  ? Number of children: 2  ? Years of education: Not on file  ? Highest education level: Not on file  ?Occupational History  ? Occupation: retired  ?Tobacco Use  ? Smoking status: Former  ?  Packs/day: 0.50  ?  Years: 13.00  ?  Pack years: 6.50  ?  Types: Cigarettes  ?  Quit date: 46  ?  Years since quitting: 38.2  ? Smokeless tobacco: Never  ?Vaping Use  ? Vaping Use: Never used  ?Substance and Sexual Activity  ? Alcohol use: No  ? Drug use: No  ? Sexual activity: Not on file  ?Other Topics Concern  ? Not on file  ?Social History Narrative  ? Not on file  ? ?Social Determinants of Health  ? ?Financial Resource Strain: Not on file  ?Food Insecurity: Not on file  ?Transportation Needs: Not on file  ?Physical Activity: Not on file  ?Stress: Not on file  ?Social Connections: Not on file  ?Intimate Partner Violence: Not on file  ? ?Family History  ?Problem Relation Age of Onset  ? Cancer - Lung Mother   ? Bladder Cancer Father   ? Diabetes Father   ? COPD Father   ? Hypertension Sister   ? Heart disease Sister   ? Other Sister   ?     GAVE  ? Diabetes Sister   ? Cirrhosis Sister   ? Other Sister   ?     GAVE  ? Cancer - Colon Brother   ? Diabetes Brother   ? Heart disease Brother   ? Thyroid disease Son   ? Depression Son   ? Anxiety disorder Son   ? Hyperlipidemia Son   ? ?BP (!) 140/44   Pulse 91   Wt 56.7 kg   SpO2 95% Comment: 2L  BMI 21.46 kg/m?  ? ?Wt Readings from Last 3 Encounters:  ?04/20/21 56.7 kg  ?04/11/21  56.5 kg  ?04/06/21 56.5 kg  ? ?PHYSICAL EXAM: ?General:  NAD. No resp difficulty, arrived in [facility], frail, on oxygen ?HEENT: Normal ?Neck: Supple. No JVD. Carotids 2+ bilat; no bruits. No lymphadenopathy or thryomegaly

## 2021-04-20 ENCOUNTER — Ambulatory Visit (HOSPITAL_COMMUNITY)
Admission: RE | Admit: 2021-04-20 | Discharge: 2021-04-20 | Disposition: A | Discharge status: Home / Self Care | Payer: Medicare HMO | Source: Ambulatory Visit | Attending: Family Medicine | Admitting: Family Medicine

## 2021-04-20 ENCOUNTER — Encounter (HOSPITAL_COMMUNITY): Payer: Self-pay

## 2021-04-20 VITALS — BP 140/44 | HR 91 | Wt 125.0 lb

## 2021-04-20 DIAGNOSIS — Z86718 Personal history of other venous thrombosis and embolism: Secondary | ICD-10-CM | POA: Diagnosis not present

## 2021-04-20 DIAGNOSIS — I50810 Right heart failure, unspecified: Secondary | ICD-10-CM | POA: Diagnosis not present

## 2021-04-20 DIAGNOSIS — I6529 Occlusion and stenosis of unspecified carotid artery: Secondary | ICD-10-CM | POA: Diagnosis not present

## 2021-04-20 DIAGNOSIS — I5032 Chronic diastolic (congestive) heart failure: Secondary | ICD-10-CM | POA: Insufficient documentation

## 2021-04-20 DIAGNOSIS — B3781 Candidal esophagitis: Secondary | ICD-10-CM | POA: Diagnosis not present

## 2021-04-20 DIAGNOSIS — Z8616 Personal history of COVID-19: Secondary | ICD-10-CM | POA: Diagnosis not present

## 2021-04-20 DIAGNOSIS — R5381 Other malaise: Secondary | ICD-10-CM

## 2021-04-20 DIAGNOSIS — I270 Primary pulmonary hypertension: Secondary | ICD-10-CM | POA: Diagnosis not present

## 2021-04-20 DIAGNOSIS — I110 Hypertensive heart disease with heart failure: Secondary | ICD-10-CM | POA: Insufficient documentation

## 2021-04-20 DIAGNOSIS — J90 Pleural effusion, not elsewhere classified: Secondary | ICD-10-CM | POA: Diagnosis not present

## 2021-04-20 DIAGNOSIS — I272 Pulmonary hypertension, unspecified: Secondary | ICD-10-CM | POA: Insufficient documentation

## 2021-04-20 DIAGNOSIS — Z8249 Family history of ischemic heart disease and other diseases of the circulatory system: Secondary | ICD-10-CM | POA: Diagnosis not present

## 2021-04-20 DIAGNOSIS — D509 Iron deficiency anemia, unspecified: Secondary | ICD-10-CM | POA: Diagnosis not present

## 2021-04-20 DIAGNOSIS — Z87891 Personal history of nicotine dependence: Secondary | ICD-10-CM | POA: Insufficient documentation

## 2021-04-20 DIAGNOSIS — I7300 Raynaud's syndrome without gangrene: Secondary | ICD-10-CM | POA: Diagnosis not present

## 2021-04-20 DIAGNOSIS — I6523 Occlusion and stenosis of bilateral carotid arteries: Secondary | ICD-10-CM | POA: Diagnosis not present

## 2021-04-20 DIAGNOSIS — J9611 Chronic respiratory failure with hypoxia: Secondary | ICD-10-CM | POA: Diagnosis not present

## 2021-04-20 DIAGNOSIS — I3139 Other pericardial effusion (noninflammatory): Secondary | ICD-10-CM | POA: Insufficient documentation

## 2021-04-20 DIAGNOSIS — J849 Interstitial pulmonary disease, unspecified: Secondary | ICD-10-CM | POA: Insufficient documentation

## 2021-04-20 LAB — BASIC METABOLIC PANEL
Anion gap: 11 (ref 5–15)
BUN: 37 mg/dL — ABNORMAL HIGH (ref 8–23)
CO2: 22 mmol/L (ref 22–32)
Calcium: 8.3 mg/dL — ABNORMAL LOW (ref 8.9–10.3)
Chloride: 104 mmol/L (ref 98–111)
Creatinine, Ser: 1.93 mg/dL — ABNORMAL HIGH (ref 0.44–1.00)
GFR, Estimated: 27 mL/min — ABNORMAL LOW (ref >60)
Glucose, Bld: 168 mg/dL — ABNORMAL HIGH (ref 70–99)
Potassium: 4 mmol/L (ref 3.5–5.1)
Sodium: 137 mmol/L (ref 135–145)

## 2021-04-20 LAB — BRAIN NATRIURETIC PEPTIDE: B Natriuretic Peptide: 136.6 pg/mL — ABNORMAL HIGH (ref 0.0–100.0)

## 2021-04-20 NOTE — Patient Instructions (Addendum)
INCREASE Torsemide to 20 mg daily for 3 days, then resume normal dose of 20 mg every other day thereafter ? ?INCREASE Potassium to 10 meq daily for 3 days, then resume normal dose of 10 meq every other day thereafter ? ?Labs today ?We will only contact you if something comes back abnormal or we need to make some changes. ?Otherwise no news is good news! ? ?Keep cardiology follow up as scheduled ? ? ?Do the following things EVERYDAY: ?Weigh yourself in the morning before breakfast. Write it down and keep it in a log. ?Take your medicines as prescribed ?Eat low salt foods--Limit salt (sodium) to 2000 mg per day.  ?Stay as active as you can everyday ?Limit all fluids for the day to less than 2 liters ? ?At the [facility], you and your health needs are our priority. As part of our continuing mission to provide you with exceptional heart care, we have created designated Provider Care Teams. These Care Teams include your primary Cardiologist (physician) and Advanced Practice Providers (APPs- Physician Assistants and Nurse Practitioners) who all work together to provide you with the care you need, when you need it.  ? ?You may see any of the following providers on your designated Care Team at your next follow up: ?Dr Glori Bickers ?Dr Loralie Champagne ?Darrick Grinder, NP ?Lyda Jester, PA ?Jessica Milford,NP ?Marlyce Huge, PA ?Audry Riles, PharmD ? ? ?Please be sure to bring in all your medications bottles to every appointment.  ? ?If you have any questions or concerns before your next appointment please send [place] a message through [place] or call our office at [phone].   ? ?TO LEAVE A MESSAGE FOR THE NURSE SELECT OPTION 2, PLEASE LEAVE A MESSAGE INCLUDING: ?YOUR NAME ?DATE OF BIRTH ?CALL BACK NUMBER ?REASON FOR CALL**this is important as we prioritize the call backs ? ?YOU WILL RECEIVE A CALL BACK THE SAME DAY AS LONG AS YOU CALL BEFORE 4:00 PM ? ?

## 2021-04-25 ENCOUNTER — Encounter (HOSPITAL_COMMUNITY): Payer: Self-pay | Admitting: Internal Medicine

## 2021-04-26 ENCOUNTER — Other Ambulatory Visit (HOSPITAL_COMMUNITY): Payer: Self-pay | Admitting: *Deleted

## 2021-04-26 LAB — MISC LABCORP TEST (SEND OUT): Labcorp test code: 9985

## 2021-04-26 MED ORDER — METOLAZONE 2.5 MG PO TABS: ORAL_TABLET | ORAL | 3 refills | Status: AC

## 2021-04-29 ENCOUNTER — Other Ambulatory Visit (HOSPITAL_COMMUNITY): Payer: Self-pay | Admitting: *Deleted

## 2021-04-29 DIAGNOSIS — J9611 Chronic respiratory failure with hypoxia: Secondary | ICD-10-CM

## 2021-04-29 DIAGNOSIS — I5032 Chronic diastolic (congestive) heart failure: Secondary | ICD-10-CM

## 2021-05-04 ENCOUNTER — Ambulatory Visit (HOSPITAL_COMMUNITY)
Admission: RE | Admit: 2021-05-04 | Discharge: 2021-05-04 | Disposition: A | Discharge status: Home / Self Care | Payer: Medicare HMO | Source: Ambulatory Visit | Attending: Cardiology | Admitting: Cardiology

## 2021-05-04 DIAGNOSIS — I5032 Chronic diastolic (congestive) heart failure: Secondary | ICD-10-CM | POA: Insufficient documentation

## 2021-05-04 LAB — BASIC METABOLIC PANEL
Anion gap: 6 (ref 5–15)
BUN: 35 mg/dL — ABNORMAL HIGH (ref 8–23)
CO2: 24 mmol/L (ref 22–32)
Calcium: 8 mg/dL — ABNORMAL LOW (ref 8.9–10.3)
Chloride: 108 mmol/L (ref 98–111)
Creatinine, Ser: 1.53 mg/dL — ABNORMAL HIGH (ref 0.44–1.00)
GFR, Estimated: 36 mL/min — ABNORMAL LOW (ref >60)
Glucose, Bld: 117 mg/dL — ABNORMAL HIGH (ref 70–99)
Potassium: 3.5 mmol/L (ref 3.5–5.1)
Sodium: 138 mmol/L (ref 135–145)

## 2021-05-07 ENCOUNTER — Inpatient Hospital Stay (HOSPITAL_BASED_OUTPATIENT_CLINIC_OR_DEPARTMENT_OTHER)
Admission: EM | Admit: 2021-05-07 | Discharge: 2021-05-16 | DRG: 291 | Disposition: A | Discharge status: Hospice | Payer: Medicare HMO | Attending: Internal Medicine | Admitting: Internal Medicine

## 2021-05-07 ENCOUNTER — Emergency Department (HOSPITAL_BASED_OUTPATIENT_CLINIC_OR_DEPARTMENT_OTHER): Payer: Medicare HMO

## 2021-05-07 ENCOUNTER — Other Ambulatory Visit: Payer: Self-pay

## 2021-05-07 ENCOUNTER — Encounter (HOSPITAL_BASED_OUTPATIENT_CLINIC_OR_DEPARTMENT_OTHER): Payer: Self-pay | Admitting: Emergency Medicine

## 2021-05-07 DIAGNOSIS — F419 Anxiety disorder, unspecified: Secondary | ICD-10-CM | POA: Diagnosis present

## 2021-05-07 DIAGNOSIS — Z79899 Other long term (current) drug therapy: Secondary | ICD-10-CM

## 2021-05-07 DIAGNOSIS — Z66 Do not resuscitate: Secondary | ICD-10-CM | POA: Diagnosis not present

## 2021-05-07 DIAGNOSIS — N189 Chronic kidney disease, unspecified: Secondary | ICD-10-CM | POA: Diagnosis present

## 2021-05-07 DIAGNOSIS — J9811 Atelectasis: Secondary | ICD-10-CM | POA: Diagnosis not present

## 2021-05-07 DIAGNOSIS — D631 Anemia in chronic kidney disease: Secondary | ICD-10-CM | POA: Diagnosis present

## 2021-05-07 DIAGNOSIS — J84112 Idiopathic pulmonary fibrosis: Secondary | ICD-10-CM | POA: Diagnosis present

## 2021-05-07 DIAGNOSIS — D509 Iron deficiency anemia, unspecified: Secondary | ICD-10-CM | POA: Diagnosis present

## 2021-05-07 DIAGNOSIS — N1832 Chronic kidney disease, stage 3b: Secondary | ICD-10-CM | POA: Diagnosis present

## 2021-05-07 DIAGNOSIS — I517 Cardiomegaly: Secondary | ICD-10-CM | POA: Diagnosis not present

## 2021-05-07 DIAGNOSIS — E1122 Type 2 diabetes mellitus with diabetic chronic kidney disease: Secondary | ICD-10-CM | POA: Diagnosis present

## 2021-05-07 DIAGNOSIS — I5082 Biventricular heart failure: Secondary | ICD-10-CM | POA: Diagnosis present

## 2021-05-07 DIAGNOSIS — Z86010 Personal history of colonic polyps: Secondary | ICD-10-CM

## 2021-05-07 DIAGNOSIS — E1165 Type 2 diabetes mellitus with hyperglycemia: Secondary | ICD-10-CM | POA: Diagnosis present

## 2021-05-07 DIAGNOSIS — J811 Chronic pulmonary edema: Secondary | ICD-10-CM | POA: Diagnosis not present

## 2021-05-07 DIAGNOSIS — R54 Age-related physical debility: Secondary | ICD-10-CM | POA: Diagnosis present

## 2021-05-07 DIAGNOSIS — Z825 Family history of asthma and other chronic lower respiratory diseases: Secondary | ICD-10-CM

## 2021-05-07 DIAGNOSIS — I130 Hypertensive heart and chronic kidney disease with heart failure and stage 1 through stage 4 chronic kidney disease, or unspecified chronic kidney disease: Secondary | ICD-10-CM | POA: Diagnosis not present

## 2021-05-07 DIAGNOSIS — J9621 Acute and chronic respiratory failure with hypoxia: Secondary | ICD-10-CM | POA: Diagnosis present

## 2021-05-07 DIAGNOSIS — R000 Tachycardia, unspecified: Secondary | ICD-10-CM | POA: Diagnosis present

## 2021-05-07 DIAGNOSIS — I5033 Acute on chronic diastolic (congestive) heart failure: Secondary | ICD-10-CM | POA: Diagnosis not present

## 2021-05-07 DIAGNOSIS — D649 Anemia, unspecified: Secondary | ICD-10-CM | POA: Diagnosis present

## 2021-05-07 DIAGNOSIS — E43 Unspecified severe protein-calorie malnutrition: Secondary | ICD-10-CM | POA: Diagnosis present

## 2021-05-07 DIAGNOSIS — Z6822 Body mass index (BMI) 22.0-22.9, adult: Secondary | ICD-10-CM

## 2021-05-07 DIAGNOSIS — Z8616 Personal history of COVID-19: Secondary | ICD-10-CM

## 2021-05-07 DIAGNOSIS — J90 Pleural effusion, not elsewhere classified: Secondary | ICD-10-CM | POA: Diagnosis not present

## 2021-05-07 DIAGNOSIS — Z8052 Family history of malignant neoplasm of bladder: Secondary | ICD-10-CM

## 2021-05-07 DIAGNOSIS — E785 Hyperlipidemia, unspecified: Secondary | ICD-10-CM | POA: Diagnosis present

## 2021-05-07 DIAGNOSIS — I509 Heart failure, unspecified: Secondary | ICD-10-CM | POA: Diagnosis not present

## 2021-05-07 DIAGNOSIS — Z8249 Family history of ischemic heart disease and other diseases of the circulatory system: Secondary | ICD-10-CM

## 2021-05-07 DIAGNOSIS — I2723 Pulmonary hypertension due to lung diseases and hypoxia: Secondary | ICD-10-CM | POA: Diagnosis present

## 2021-05-07 DIAGNOSIS — Z833 Family history of diabetes mellitus: Secondary | ICD-10-CM

## 2021-05-07 DIAGNOSIS — Z515 Encounter for palliative care: Secondary | ICD-10-CM

## 2021-05-07 DIAGNOSIS — Z9049 Acquired absence of other specified parts of digestive tract: Secondary | ICD-10-CM

## 2021-05-07 DIAGNOSIS — R627 Adult failure to thrive: Secondary | ICD-10-CM | POA: Diagnosis present

## 2021-05-07 DIAGNOSIS — Z86718 Personal history of other venous thrombosis and embolism: Secondary | ICD-10-CM

## 2021-05-07 DIAGNOSIS — R5381 Other malaise: Secondary | ICD-10-CM | POA: Diagnosis present

## 2021-05-07 DIAGNOSIS — J449 Chronic obstructive pulmonary disease, unspecified: Secondary | ICD-10-CM | POA: Diagnosis present

## 2021-05-07 DIAGNOSIS — I071 Rheumatic tricuspid insufficiency: Secondary | ICD-10-CM | POA: Diagnosis present

## 2021-05-07 DIAGNOSIS — E1169 Type 2 diabetes mellitus with other specified complication: Secondary | ICD-10-CM | POA: Diagnosis present

## 2021-05-07 DIAGNOSIS — I3139 Other pericardial effusion (noninflammatory): Secondary | ICD-10-CM | POA: Diagnosis present

## 2021-05-07 DIAGNOSIS — Z818 Family history of other mental and behavioral disorders: Secondary | ICD-10-CM

## 2021-05-07 DIAGNOSIS — Z87891 Personal history of nicotine dependence: Secondary | ICD-10-CM

## 2021-05-07 DIAGNOSIS — K2100 Gastro-esophageal reflux disease with esophagitis, without bleeding: Secondary | ICD-10-CM | POA: Diagnosis present

## 2021-05-07 DIAGNOSIS — Z888 Allergy status to other drugs, medicaments and biological substances status: Secondary | ICD-10-CM

## 2021-05-07 DIAGNOSIS — N179 Acute kidney failure, unspecified: Secondary | ICD-10-CM | POA: Diagnosis present

## 2021-05-07 DIAGNOSIS — I272 Pulmonary hypertension, unspecified: Secondary | ICD-10-CM | POA: Diagnosis present

## 2021-05-07 DIAGNOSIS — F32A Depression, unspecified: Secondary | ICD-10-CM | POA: Diagnosis present

## 2021-05-07 LAB — CBC WITH DIFFERENTIAL/PLATELET
Abs Immature Granulocytes: 0.04 K/uL (ref 0.00–0.07)
Basophils Absolute: 0.1 K/uL (ref 0.0–0.1)
Basophils Relative: 1 %
Eosinophils Absolute: 0.4 K/uL (ref 0.0–0.5)
Eosinophils Relative: 5 %
HCT: 33.1 % — ABNORMAL LOW (ref 36.0–46.0)
Hemoglobin: 9.6 g/dL — ABNORMAL LOW (ref 12.0–15.0)
Immature Granulocytes: 1 %
Lymphocytes Relative: 11 %
Lymphs Abs: 0.9 K/uL (ref 0.7–4.0)
MCH: 21 pg — ABNORMAL LOW (ref 26.0–34.0)
MCHC: 29 g/dL — ABNORMAL LOW (ref 30.0–36.0)
MCV: 72.3 fL — ABNORMAL LOW (ref 80.0–100.0)
Monocytes Absolute: 1.1 K/uL — ABNORMAL HIGH (ref 0.1–1.0)
Monocytes Relative: 13 %
Neutro Abs: 5.8 K/uL (ref 1.7–7.7)
Neutrophils Relative %: 69 %
Platelets: 348 K/uL (ref 150–400)
RBC: 4.58 MIL/uL (ref 3.87–5.11)
RDW: 26.5 % — ABNORMAL HIGH (ref 11.5–15.5)
WBC: 8.2 K/uL (ref 4.0–10.5)
nRBC: 0 % (ref 0.0–0.2)

## 2021-05-07 LAB — COMPREHENSIVE METABOLIC PANEL WITH GFR
ALT: 5 U/L (ref 0–44)
AST: 13 U/L — ABNORMAL LOW (ref 15–41)
Albumin: 2.7 g/dL — ABNORMAL LOW (ref 3.5–5.0)
Alkaline Phosphatase: 67 U/L (ref 38–126)
Anion gap: 9 (ref 5–15)
BUN: 32 mg/dL — ABNORMAL HIGH (ref 8–23)
CO2: 23 mmol/L (ref 22–32)
Calcium: 8.3 mg/dL — ABNORMAL LOW (ref 8.9–10.3)
Chloride: 107 mmol/L (ref 98–111)
Creatinine, Ser: 1.34 mg/dL — ABNORMAL HIGH (ref 0.44–1.00)
GFR, Estimated: 42 mL/min — ABNORMAL LOW
Glucose, Bld: 182 mg/dL — ABNORMAL HIGH (ref 70–99)
Potassium: 3.5 mmol/L (ref 3.5–5.1)
Sodium: 139 mmol/L (ref 135–145)
Total Bilirubin: 0.4 mg/dL (ref 0.3–1.2)
Total Protein: 5.8 g/dL — ABNORMAL LOW (ref 6.5–8.1)

## 2021-05-07 LAB — PROTIME-INR
INR: 1.2 (ref 0.8–1.2)
Prothrombin Time: 14.6 s (ref 11.4–15.2)

## 2021-05-07 LAB — TROPONIN I (HIGH SENSITIVITY): Troponin I (High Sensitivity): 9 ng/L

## 2021-05-07 LAB — BRAIN NATRIURETIC PEPTIDE: B Natriuretic Peptide: 321.1 pg/mL — ABNORMAL HIGH (ref 0.0–100.0)

## 2021-05-07 LAB — LACTIC ACID, PLASMA: Lactic Acid, Venous: 2.6 mmol/L (ref 0.5–1.9)

## 2021-05-07 MED ORDER — ALBUTEROL SULFATE (2.5 MG/3ML) 0.083% IN NEBU
INHALATION_SOLUTION | RESPIRATORY_TRACT | Status: AC
  Filled 2021-05-07: qty 6

## 2021-05-07 MED ORDER — ALBUTEROL SULFATE (2.5 MG/3ML) 0.083% IN NEBU
5.0000 mg | INHALATION_SOLUTION | Freq: Once | RESPIRATORY_TRACT | Status: AC
  Administered 2021-05-07: 5 mg via RESPIRATORY_TRACT

## 2021-05-07 MED ORDER — ALBUTEROL SULFATE HFA 108 (90 BASE) MCG/ACT IN AERS
2.0000 | INHALATION_SPRAY | RESPIRATORY_TRACT | Status: DC | PRN
  Administered 2021-05-07: 2 via RESPIRATORY_TRACT
  Filled 2021-05-07: qty 6.7

## 2021-05-07 MED ORDER — FUROSEMIDE 10 MG/ML IJ SOLN
60.0000 mg | Freq: Once | INTRAMUSCULAR | Status: AC
  Administered 2021-05-07: 60 mg via INTRAVENOUS
  Filled 2021-05-07: qty 6

## 2021-05-07 NOTE — ED Notes (Signed)
Dr Dorothyann Gibbs aware of lactic acid of 2.6.  ?

## 2021-05-07 NOTE — Progress Notes (Signed)
Patient: Jacqueline Harris, Jacqueline Harris  ?DOB: 2045-05-25 ?PLW:[id number] ?Transferring facility: [facility] ?Requesting provider: Dr. Johnney Killian (EDP at [facility]) ?Reason for transfer: admission for further evaluation and management of acute on chronic heart failure with preserved EF. ? ? ?76 year old female with medical history notable for chronic hypoxic respiratory failure on baseline continuous 2 L nasal cannula, chronic heart failure with preserved EF, chronic right-sided systolic heart failure, who presented to [facility] ED on 05/07/2021 complaining of approximately 1 week of progressive shortness of breath associated with worsening edema in bilateral lower extremities.  In setting of her chronic heart failure with preserved EF, she follows with Dr. Haroldine Laws as her outpatient cardiologist.  As an outpatient, she is on torsemide 20 mg p.o. every 48 hours as well as as needed Zaroxolyn.  She reports good compliance with her baseline diuretic regimen, and per reported cardiology recommendations, she has taken some additional doses of these home diuretics over the course of the last week.  However, in spite of these measures, she has continued to experience progressive shortness of breath, prompting her to present to [facility] ED earlier today. ? ?Most recent echocardiogram, which was performed on 04/04/2021 was notable for LVEF 50 to 55%, no focal wall motion normalities, normal diastolic parameters, mildly reduced right ventricular systolic function, small pericardial effusion, and mild mitral vegetation. ? ?Vital signs in the ED were notable for the following: Blood pressures in the 140s over 60s; oxygen saturation 92 to 93% on her baseline 2 L nasal cannula. ? ?Imaging notable for chest x-ray, which reportedly shows evidence of edema without infiltrate.  EKG without overt acute ischemic changes.  ? ?Medications administered at [facility] prior to transfer included the following: Lasix 60 mg IV x1 ? ?Subsequently, I accepted this patient for transfer  for inpatient admission to a cardiac telemetry bed at [facility] for further work-up and management of acute on chronic heart failure with preserved EF.  ? ? ? ? ?Check [email or web address] for on-call coverage. ?  ?Nursing staff, Please call [place] number on Amion as soon as patient's arrival, so appropriate admitting provider can evaluate the pt. ? ? ? ? ?Babs Bertin, DO ?Hospitalist ? ?

## 2021-05-07 NOTE — ED Provider Notes (Signed)
?[facility] ?Provider Note ? ? ?CSN: [id number] ?Arrival date & time: 05/07/21  1725 ? ?  ? ?History ? ?Chief Complaint  ?Patient presents with  ? Shortness of Breath  ? ? ?Jacqueline Harris is a 76 y.o. female. ? ?HPI ?Patient has history of congestive heart failure.  She was admitted to the hospital discharge about 3 weeks ago.  Patient is chronically on 2 L of home oxygen.  Patient reports she has had increasing shortness of breath and general weakness.  She reports she is getting very fatigued with even minor household activities such as trying to go to the bathroom.  She is now requiring assistance from family members.  Patient daughter reports that she has increased swelling in her legs again.  Patient has been taking her torsemide and Zaroxolyn as instructed by Dr. Nani Ravens.  Patient's daughter reports that due to mobility and weakness issues they have not been able to weigh the patient to see if there has been weight gain.  Patient reports that she is eating well.  She has not had loss of appetite.  She has not had vomiting.  No fevers.  No productive cough. ?  ? ?Home Medications ?Prior to Admission medications   ?Medication Sig Start Date End Date Taking? Authorizing Provider  ?acetaminophen (TYLENOL) 650 MG CR tablet Take 650 mg by mouth every 8 (eight) hours as needed for pain.    [provider]  ?ALPRAZolam Duanne Moron) 0.5 MG tablet Take 0.5 mg by mouth at bedtime as needed for anxiety.    [provider]  ?citalopram (CELEXA) 20 MG tablet Take 20 mg by mouth daily.    [provider]  ?ferrous sulfate 325 (65 FE) MG EC tablet Take 1 tablet (325 mg total) by mouth daily. 04/12/21   Levin Erp, PA  ?hydrOXYzine (VISTARIL) 25 MG capsule Take 25 mg by mouth at bedtime as needed (sleep). 02/24/21   [provider]  ?macitentan (OPSUMIT) 10 MG tablet Take 1 tablet (10 mg total) by mouth daily. 01/18/21   Bensimhon, Shaune Pascal, MD  ?metolazone  (ZAROXOLYN) 2.5 MG tablet Take only as directed by CHF clinic. 04/26/21   Bensimhon, Shaune Pascal, MD  ?Multiple Vitamins-Minerals (MULTIVITAMIN WITH MINERALS) tablet Take 1 tablet by mouth daily.    [provider]  ?omeprazole (PRILOSEC) 40 MG capsule Take 1 capsule (40 mg total) by mouth 2 (two) times daily. 03/18/21 06/10/21  Sharyn Creamer, MD  ?potassium chloride (KLOR-CON) 10 MEQ tablet TAKE 1 TABLET BY MOUTH EVERY DAY ?Patient taking differently: Take 10 mEq by mouth every other day. 03/31/21   Bensimhon, Shaune Pascal, MD  ?sildenafil (REVATIO) 20 MG tablet Take 1 tablet (20 mg total) by mouth 3 (three) times daily. 03/28/21   Bensimhon, Shaune Pascal, MD  ?torsemide (DEMADEX) 20 MG tablet Take 52m daily for three days and then take 273mevery other day. ?Patient taking differently: take 2033mvery other day. 04/12/21   MilRafael BihariNP  ?   ? ?Allergies    ?Sildenafil and Spironolactone   ? ?Review of Systems   ?Review of Systems ?10 systems reviewed and negative except as per HPI ?Physical Exam ?Updated Vital Signs ?BP (!) 155/65   Pulse 95   Temp 98.1 ?F (36.7 ?C) (Oral)   Resp (!) 21   SpO2 95%  ?Physical Exam ?Constitutional:   ?   Comments: Alert but fatigued in appearance.  Mild increased work of breathing at rest.  ?  HENT:  ?   Mouth/Throat:  ?   Pharynx: Oropharynx is clear.  ?Eyes:  ?   Extraocular Movements: Extraocular movements intact.  ?Cardiovascular:  ?   Rate and Rhythm: Normal rate and regular rhythm.  ?Pulmonary:  ?   Comments: Mild increased work of breathing at rest.  Crackles at the lung bases.  Some expiratory wheeze. ?Abdominal:  ?   General: There is distension.  ?   Palpations: Abdomen is soft.  ?   Tenderness: There is no abdominal tenderness. There is no guarding.  ?Musculoskeletal:  ?   Comments: Pitting edema up to the thighs.  ?Skin: ?   General: Skin is warm and dry.  ?Neurological:  ?   General: No focal deficit present.  ?   Mental Status: She is oriented to person, place,  and time.  ?   Coordination: Coordination normal.  ? ? ?ED Results / Procedures / Treatments   ?Labs ?(all labs ordered are listed, but only abnormal results are displayed) ?Labs Reviewed  ?BRAIN NATRIURETIC PEPTIDE - Abnormal; Notable for the following components:  ?    Result Value  ? B Natriuretic Peptide 321.1 (*)   ? All other components within normal limits  ?COMPREHENSIVE METABOLIC PANEL - Abnormal; Notable for the following components:  ? Glucose, Bld 182 (*)   ? BUN 32 (*)   ? Creatinine, Ser 1.34 (*)   ? Calcium 8.3 (*)   ? Total Protein 5.8 (*)   ? Albumin 2.7 (*)   ? AST 13 (*)   ? GFR, Estimated 42 (*)   ? All other components within normal limits  ?LACTIC ACID, PLASMA - Abnormal; Notable for the following components:  ? Lactic Acid, Venous 2.6 (*)   ? All other components within normal limits  ?CBC WITH DIFFERENTIAL/PLATELET - Abnormal; Notable for the following components:  ? Hemoglobin 9.6 (*)   ? HCT 33.1 (*)   ? MCV 72.3 (*)   ? MCH 21.0 (*)   ? MCHC 29.0 (*)   ? RDW 26.5 (*)   ? Monocytes Absolute 1.1 (*)   ? All other components within normal limits  ?PROTIME-INR  ?LACTIC ACID, PLASMA  ?TROPONIN I (HIGH SENSITIVITY)  ?TROPONIN I (HIGH SENSITIVITY)  ? ? ?EKG ?EKG Interpretation ? ?Date/Time:  Saturday May 07 2021 18:03:34 EDT ?Ventricular Rate:  89 ?PR Interval:  164 ?QRS Duration: 91 ?QT Interval:  390 ?QTC Calculation: 475 ?R Axis:   -40 ?Text Interpretation: Sinus rhythm Left anterior fascicular block Low voltage, precordial leads Consider anterior infarct no sig change from previous Confirmed by Charlesetta Shanks [phone]) on 05/08/2021 12:36:38 AM ? ?Radiology ?DG Chest Port 1 View ? ?Result Date: 05/07/2021 ?CLINICAL DATA:  Shortness of breath. EXAM: PORTABLE CHEST 1 VIEW COMPARISON:  04/04/2021 chest radiograph FINDINGS: Cardiomegaly again noted. Pulmonary vascular congestion and mild interstitial opacities are noted. Slightly increasing LEFT pleural effusion/LEFT LOWER lung atelectasis noted.  A small RIGHT pleural effusion is noted. No pneumothorax or acute bony abnormality. IMPRESSION: 1. Cardiomegaly with pulmonary vascular congestion, mild interstitial edema and slightly increasing LEFT pleural effusion/LEFT LOWER lung atelectasis. 2. Small RIGHT pleural effusion. Electronically Signed   By: Margarette Canada M.D.   On: 05/07/2021 18:17   ? ?Procedures ?Procedures  ? ? ?Medications Ordered in ED ?Medications  ?albuterol (VENTOLIN HFA) 108 (90 Base) MCG/ACT inhaler 2 puff (2 puffs Inhalation Given 05/07/21 1851)  ?albuterol (PROVENTIL) (2.5 MG/3ML) 0.083% nebulizer solution (  Not Given 05/07/21 1757)  ?albuterol (  PROVENTIL) (2.5 MG/3ML) 0.083% nebulizer solution 5 mg (5 mg Nebulization Given 05/07/21 1754)  ?furosemide (LASIX) injection 60 mg (60 mg Intravenous Given 05/07/21 1952)  ? ? ?ED Course/ Medical Decision Making/ A&P ?  ?                        ?Medical Decision Making ?Amount and/or Complexity of Data Reviewed ?Labs: ordered. ?Radiology: ordered. ? ?Risk ?Prescription drug management. ?Decision regarding hospitalization. ? ? ?Consult: Reviewed as [facility] hospitalist Dr. Velia Meyer for admission. ? ?Patient has known history of congestive heart failure.  Symptoms have been worsening with increased shortness of breath, edema and fatigue.  No associated chest pain.  Differential diagnosis includes congestive heart failure\pneumonia\PE.  Will initiate evaluation with labs, chest x-ray and EKG. ? ?EKG is interpreted by myself without acute ischemic change.  Chest x-ray reviewed by radiology shows venous congestion. ? ?Will initiate Lasix 60 mg IV for CHF exacerbation.  Patient is on baseline 2 L nasal cannula oxygen. ? ?Patient's blood pressures are stable.  She is not showing signs of hypotension.  Patient is maintaining on baseline 2 L nasal cannula.  At this time I do not anticipate she will need additional respiratory support such as BiPAP however patient does have significant comorbid condition and will  need close observation.  Patient requires admission for stabilization and treatment. ? ? ? ? ? ? ? ?Final Clinical Impression(s) / ED Diagnoses ?Final diagnoses:  ?Acute on chronic congestive heart failure,

## 2021-05-07 NOTE — ED Triage Notes (Signed)
Pt c/o worsening SOB and bilateral leg swelling for 2+ weeks, since d/c from hospital. Pt h/o interstitial lung disease and on 2L home O2. ?

## 2021-05-08 DIAGNOSIS — I5082 Biventricular heart failure: Secondary | ICD-10-CM | POA: Diagnosis not present

## 2021-05-08 DIAGNOSIS — I3139 Other pericardial effusion (noninflammatory): Secondary | ICD-10-CM | POA: Diagnosis not present

## 2021-05-08 DIAGNOSIS — J449 Chronic obstructive pulmonary disease, unspecified: Secondary | ICD-10-CM | POA: Diagnosis not present

## 2021-05-08 DIAGNOSIS — F419 Anxiety disorder, unspecified: Secondary | ICD-10-CM | POA: Diagnosis not present

## 2021-05-08 DIAGNOSIS — I071 Rheumatic tricuspid insufficiency: Secondary | ICD-10-CM | POA: Diagnosis not present

## 2021-05-08 DIAGNOSIS — N179 Acute kidney failure, unspecified: Secondary | ICD-10-CM | POA: Diagnosis not present

## 2021-05-08 DIAGNOSIS — I130 Hypertensive heart and chronic kidney disease with heart failure and stage 1 through stage 4 chronic kidney disease, or unspecified chronic kidney disease: Secondary | ICD-10-CM | POA: Diagnosis not present

## 2021-05-08 DIAGNOSIS — D649 Anemia, unspecified: Secondary | ICD-10-CM | POA: Diagnosis present

## 2021-05-08 DIAGNOSIS — I5031 Acute diastolic (congestive) heart failure: Secondary | ICD-10-CM | POA: Diagnosis not present

## 2021-05-08 DIAGNOSIS — E785 Hyperlipidemia, unspecified: Secondary | ICD-10-CM

## 2021-05-08 DIAGNOSIS — R69 Illness, unspecified: Secondary | ICD-10-CM | POA: Diagnosis not present

## 2021-05-08 DIAGNOSIS — N189 Chronic kidney disease, unspecified: Secondary | ICD-10-CM | POA: Diagnosis not present

## 2021-05-08 DIAGNOSIS — E1169 Type 2 diabetes mellitus with other specified complication: Secondary | ICD-10-CM | POA: Diagnosis present

## 2021-05-08 DIAGNOSIS — Z66 Do not resuscitate: Secondary | ICD-10-CM | POA: Diagnosis not present

## 2021-05-08 DIAGNOSIS — F32A Depression, unspecified: Secondary | ICD-10-CM | POA: Diagnosis not present

## 2021-05-08 DIAGNOSIS — E1122 Type 2 diabetes mellitus with diabetic chronic kidney disease: Secondary | ICD-10-CM | POA: Diagnosis not present

## 2021-05-08 DIAGNOSIS — N1832 Chronic kidney disease, stage 3b: Secondary | ICD-10-CM | POA: Diagnosis not present

## 2021-05-08 DIAGNOSIS — Z8616 Personal history of COVID-19: Secondary | ICD-10-CM | POA: Diagnosis not present

## 2021-05-08 DIAGNOSIS — D509 Iron deficiency anemia, unspecified: Secondary | ICD-10-CM | POA: Diagnosis not present

## 2021-05-08 DIAGNOSIS — J84112 Idiopathic pulmonary fibrosis: Secondary | ICD-10-CM | POA: Diagnosis not present

## 2021-05-08 DIAGNOSIS — I272 Pulmonary hypertension, unspecified: Secondary | ICD-10-CM | POA: Diagnosis not present

## 2021-05-08 DIAGNOSIS — Z515 Encounter for palliative care: Secondary | ICD-10-CM | POA: Diagnosis not present

## 2021-05-08 DIAGNOSIS — J9621 Acute and chronic respiratory failure with hypoxia: Secondary | ICD-10-CM | POA: Diagnosis not present

## 2021-05-08 DIAGNOSIS — Z888 Allergy status to other drugs, medicaments and biological substances status: Secondary | ICD-10-CM | POA: Diagnosis not present

## 2021-05-08 DIAGNOSIS — I5033 Acute on chronic diastolic (congestive) heart failure: Secondary | ICD-10-CM

## 2021-05-08 DIAGNOSIS — Z7189 Other specified counseling: Secondary | ICD-10-CM | POA: Diagnosis not present

## 2021-05-08 DIAGNOSIS — J849 Interstitial pulmonary disease, unspecified: Secondary | ICD-10-CM | POA: Diagnosis not present

## 2021-05-08 DIAGNOSIS — D631 Anemia in chronic kidney disease: Secondary | ICD-10-CM | POA: Diagnosis not present

## 2021-05-08 DIAGNOSIS — I509 Heart failure, unspecified: Secondary | ICD-10-CM | POA: Diagnosis not present

## 2021-05-08 DIAGNOSIS — I270 Primary pulmonary hypertension: Secondary | ICD-10-CM | POA: Diagnosis not present

## 2021-05-08 DIAGNOSIS — Z6822 Body mass index (BMI) 22.0-22.9, adult: Secondary | ICD-10-CM | POA: Diagnosis not present

## 2021-05-08 DIAGNOSIS — I2723 Pulmonary hypertension due to lung diseases and hypoxia: Secondary | ICD-10-CM | POA: Diagnosis not present

## 2021-05-08 DIAGNOSIS — E43 Unspecified severe protein-calorie malnutrition: Secondary | ICD-10-CM | POA: Diagnosis not present

## 2021-05-08 DIAGNOSIS — J90 Pleural effusion, not elsewhere classified: Secondary | ICD-10-CM | POA: Diagnosis not present

## 2021-05-08 DIAGNOSIS — R0600 Dyspnea, unspecified: Secondary | ICD-10-CM | POA: Diagnosis not present

## 2021-05-08 LAB — CBC WITH DIFFERENTIAL/PLATELET
Abs Immature Granulocytes: 0.03 10*3/uL (ref 0.00–0.07)
Basophils Absolute: 0 10*3/uL (ref 0.0–0.1)
Basophils Relative: 1 %
Eosinophils Absolute: 0.3 10*3/uL (ref 0.0–0.5)
Eosinophils Relative: 4 %
HCT: 31.7 % — ABNORMAL LOW (ref 36.0–46.0)
Hemoglobin: 9.5 g/dL — ABNORMAL LOW (ref 12.0–15.0)
Immature Granulocytes: 0 %
Lymphocytes Relative: 10 %
Lymphs Abs: 0.7 10*3/uL (ref 0.7–4.0)
MCH: 21.7 pg — ABNORMAL LOW (ref 26.0–34.0)
MCHC: 30 g/dL (ref 30.0–36.0)
MCV: 72.5 fL — ABNORMAL LOW (ref 80.0–100.0)
Monocytes Absolute: 1 10*3/uL (ref 0.1–1.0)
Monocytes Relative: 14 %
Neutro Abs: 5.2 10*3/uL (ref 1.7–7.7)
Neutrophils Relative %: 71 %
Platelets: 335 10*3/uL (ref 150–400)
RBC: 4.37 MIL/uL (ref 3.87–5.11)
RDW: 26.2 % — ABNORMAL HIGH (ref 11.5–15.5)
WBC: 7.2 10*3/uL (ref 4.0–10.5)
nRBC: 0 % (ref 0.0–0.2)

## 2021-05-08 LAB — COMPREHENSIVE METABOLIC PANEL
ALT: 10 U/L (ref 0–44)
AST: 15 U/L (ref 15–41)
Albumin: 2.1 g/dL — ABNORMAL LOW (ref 3.5–5.0)
Alkaline Phosphatase: 71 U/L (ref 38–126)
Anion gap: 7 (ref 5–15)
BUN: 30 mg/dL — ABNORMAL HIGH (ref 8–23)
CO2: 26 mmol/L (ref 22–32)
Calcium: 8.1 mg/dL — ABNORMAL LOW (ref 8.9–10.3)
Chloride: 105 mmol/L (ref 98–111)
Creatinine, Ser: 1.25 mg/dL — ABNORMAL HIGH (ref 0.44–1.00)
GFR, Estimated: 46 mL/min — ABNORMAL LOW (ref >60)
Glucose, Bld: 143 mg/dL — ABNORMAL HIGH (ref 70–99)
Potassium: 3.5 mmol/L (ref 3.5–5.1)
Sodium: 138 mmol/L (ref 135–145)
Total Bilirubin: 0.7 mg/dL (ref 0.3–1.2)
Total Protein: 5.4 g/dL — ABNORMAL LOW (ref 6.5–8.1)

## 2021-05-08 LAB — CBC
HCT: 31.7 % — ABNORMAL LOW (ref 36.0–46.0)
Hemoglobin: 9.2 g/dL — ABNORMAL LOW (ref 12.0–15.0)
MCH: 21.1 pg — ABNORMAL LOW (ref 26.0–34.0)
MCHC: 29 g/dL — ABNORMAL LOW (ref 30.0–36.0)
MCV: 72.9 fL — ABNORMAL LOW (ref 80.0–100.0)
Platelets: 332 10*3/uL (ref 150–400)
RBC: 4.35 MIL/uL (ref 3.87–5.11)
RDW: 25.9 % — ABNORMAL HIGH (ref 11.5–15.5)
WBC: 6.9 10*3/uL (ref 4.0–10.5)
nRBC: 0 % (ref 0.0–0.2)

## 2021-05-08 LAB — TROPONIN I (HIGH SENSITIVITY): Troponin I (High Sensitivity): 13 ng/L (ref <18)

## 2021-05-08 LAB — GLUCOSE, CAPILLARY
Glucose-Capillary: 120 mg/dL — ABNORMAL HIGH (ref 70–99)
Glucose-Capillary: 110 mg/dL — ABNORMAL HIGH (ref 70–99)
Glucose-Capillary: 99 mg/dL (ref 70–99)
Glucose-Capillary: 106 mg/dL — ABNORMAL HIGH (ref 70–99)

## 2021-05-08 LAB — LACTIC ACID, PLASMA: Lactic Acid, Venous: 2 mmol/L (ref 0.5–1.9)

## 2021-05-08 LAB — PHOSPHORUS: Phosphorus: 3 mg/dL (ref 2.5–4.6)

## 2021-05-08 LAB — MAGNESIUM: Magnesium: 1.8 mg/dL (ref 1.7–2.4)

## 2021-05-08 LAB — HEMOGLOBIN A1C
Hgb A1c MFr Bld: 5.7 % — ABNORMAL HIGH (ref 4.8–5.6)
Mean Plasma Glucose: 116.89 mg/dL

## 2021-05-08 MED ORDER — INSULIN ASPART 100 UNIT/ML IJ SOLN: 0.0000 [IU] | Freq: Three times a day (TID) | INTRAMUSCULAR | Status: DC

## 2021-05-08 MED ORDER — MACITENTAN 10 MG PO TABS
10.0000 mg | ORAL_TABLET | Freq: Every day | ORAL | Status: DC
  Administered 2021-05-08 – 2021-05-16 (×9): 10 mg via ORAL
  Filled 2021-05-08 (×9): qty 1

## 2021-05-08 MED ORDER — DIPHENHYDRAMINE-ZINC ACETATE 2-0.1 % EX CREA
TOPICAL_CREAM | Freq: Two times a day (BID) | CUTANEOUS | Status: DC | PRN
  Filled 2021-05-08: qty 28

## 2021-05-08 MED ORDER — ENOXAPARIN SODIUM 40 MG/0.4ML IJ SOSY
40.0000 mg | PREFILLED_SYRINGE | INTRAMUSCULAR | Status: DC
  Administered 2021-05-08: 40 mg via SUBCUTANEOUS
  Filled 2021-05-08: qty 0.4

## 2021-05-08 MED ORDER — FUROSEMIDE 10 MG/ML IJ SOLN
60.0000 mg | Freq: Two times a day (BID) | INTRAMUSCULAR | Status: DC
  Administered 2021-05-08 – 2021-05-09 (×4): 60 mg via INTRAVENOUS
  Filled 2021-05-08 (×4): qty 6

## 2021-05-08 MED ORDER — FERROUS SULFATE 325 (65 FE) MG PO TABS
325.0000 mg | ORAL_TABLET | Freq: Every day | ORAL | Status: DC
  Administered 2021-05-08 – 2021-05-16 (×9): 325 mg via ORAL
  Filled 2021-05-08 (×9): qty 1

## 2021-05-08 MED ORDER — CITALOPRAM HYDROBROMIDE 20 MG PO TABS
20.0000 mg | ORAL_TABLET | Freq: Every day | ORAL | Status: DC
  Administered 2021-05-08 – 2021-05-16 (×9): 20 mg via ORAL
  Filled 2021-05-08 (×9): qty 1

## 2021-05-08 MED ORDER — PROCHLORPERAZINE EDISYLATE 10 MG/2ML IJ SOLN
10.0000 mg | Freq: Four times a day (QID) | INTRAMUSCULAR | Status: DC | PRN
  Administered 2021-05-11: 10 mg via INTRAVENOUS
  Filled 2021-05-08: qty 2

## 2021-05-08 MED ORDER — FUROSEMIDE 10 MG/ML IJ SOLN
20.0000 mg | Freq: Two times a day (BID) | INTRAMUSCULAR | Status: DC
  Administered 2021-05-08: 20 mg via INTRAVENOUS
  Filled 2021-05-08: qty 2

## 2021-05-08 MED ORDER — ADULT MULTIVITAMIN W/MINERALS CH
1.0000 | ORAL_TABLET | Freq: Every day | ORAL | Status: DC
  Administered 2021-05-08 – 2021-05-16 (×9): 1 via ORAL
  Filled 2021-05-08 (×9): qty 1

## 2021-05-08 MED ORDER — ALPRAZOLAM 0.5 MG PO TABS
0.5000 mg | ORAL_TABLET | Freq: Every evening | ORAL | Status: DC | PRN
  Administered 2021-05-08 – 2021-05-15 (×7): 0.5 mg via ORAL
  Filled 2021-05-08 (×7): qty 1

## 2021-05-08 MED ORDER — IPRATROPIUM-ALBUTEROL 0.5-2.5 (3) MG/3ML IN SOLN
3.0000 mL | Freq: Four times a day (QID) | RESPIRATORY_TRACT | Status: DC
  Administered 2021-05-08 (×3): 3 mL via RESPIRATORY_TRACT
  Filled 2021-05-08 (×3): qty 3

## 2021-05-08 MED ORDER — HYDROXYZINE HCL 25 MG PO TABS: 25.0000 mg | ORAL_TABLET | Freq: Every evening | ORAL | Status: DC | PRN

## 2021-05-08 MED ORDER — SILDENAFIL CITRATE 20 MG PO TABS
20.0000 mg | ORAL_TABLET | Freq: Three times a day (TID) | ORAL | Status: DC
  Administered 2021-05-08 – 2021-05-16 (×25): 20 mg via ORAL
  Filled 2021-05-08 (×25): qty 1

## 2021-05-08 MED ORDER — POLYETHYLENE GLYCOL 3350 17 G PO PACK: 17.0000 g | PACK | Freq: Every day | ORAL | Status: DC | PRN

## 2021-05-08 MED ORDER — PANTOPRAZOLE SODIUM 40 MG PO TBEC
40.0000 mg | DELAYED_RELEASE_TABLET | Freq: Two times a day (BID) | ORAL | Status: DC
Start: 2021-05-08 — End: 2021-05-16
  Administered 2021-05-08 – 2021-05-16 (×18): 40 mg via ORAL
  Filled 2021-05-08 (×18): qty 1

## 2021-05-08 MED ORDER — PANTOPRAZOLE SODIUM 40 MG PO TBEC: 40.0000 mg | DELAYED_RELEASE_TABLET | Freq: Every day | ORAL | Status: DC

## 2021-05-08 MED ORDER — IPRATROPIUM-ALBUTEROL 0.5-2.5 (3) MG/3ML IN SOLN
3.0000 mL | Freq: Two times a day (BID) | RESPIRATORY_TRACT | Status: DC
  Administered 2021-05-09: 3 mL via RESPIRATORY_TRACT
  Filled 2021-05-08: qty 3

## 2021-05-08 MED ORDER — ACETAMINOPHEN 325 MG PO TABS
650.0000 mg | ORAL_TABLET | Freq: Four times a day (QID) | ORAL | Status: DC | PRN
  Administered 2021-05-08 – 2021-05-13 (×2): 650 mg via ORAL
  Filled 2021-05-08 (×3): qty 2

## 2021-05-08 MED ORDER — METOLAZONE 5 MG PO TABS: 2.5000 mg | ORAL_TABLET | Freq: Every day | ORAL | Status: DC

## 2021-05-08 MED ORDER — POTASSIUM CHLORIDE CRYS ER 10 MEQ PO TBCR
10.0000 meq | EXTENDED_RELEASE_TABLET | Freq: Every day | ORAL | Status: DC
  Administered 2021-05-08 – 2021-05-10 (×3): 10 meq via ORAL
  Filled 2021-05-08 (×3): qty 1

## 2021-05-08 MED ORDER — FUROSEMIDE 10 MG/ML IJ SOLN: 40.0000 mg | Freq: Two times a day (BID) | INTRAMUSCULAR | Status: DC

## 2021-05-08 MED ORDER — POTASSIUM CHLORIDE CRYS ER 20 MEQ PO TBCR
40.0000 meq | EXTENDED_RELEASE_TABLET | ORAL | Status: AC
  Administered 2021-05-08: 40 meq via ORAL
  Filled 2021-05-08: qty 2

## 2021-05-08 MED ORDER — MACITENTAN 10 MG PO TABS: 10.0000 mg | ORAL_TABLET | Freq: Every day | ORAL | Status: DC

## 2021-05-08 MED ORDER — INSULIN ASPART 100 UNIT/ML IJ SOLN: 0.0000 [IU] | Freq: Every day | INTRAMUSCULAR | Status: DC

## 2021-05-08 NOTE — Evaluation (Signed)
Physical Therapy Evaluation ?Patient Details ?Name: Jacqueline Harris ?MRN: [id number] ?DOB: 01/20/1945 ?Today's Date: 05/08/2021 ? ?History of Present Illness ? Patient is a 76 y/o female who presents on 4/22 with SOB and LE swelling. Admitted with acute on chronic diastolic CHF. Last admission 3/23 for respiratory failure with hypoxia secondary to CHF. PMH includes HTn, GAVE syndrome, CHF.  ?Clinical Impression ? Patient presents with lethargy, generalized weakness, pain, dyspnea on exertion, impaired balance, decreased activity tolerance and impaired mobility s/p above. Pt lives alone but has been needing family assist for ADls and mobility since admission in march. Pt is a minimal household ambulator at baseline, not using an AD. Today, pt requires Mod A for bed mobility and standing transfer due to weakness. Able to take a few steps along side bed with Sp02 dropping to 86% on 02. Pt eager to return home at d/c despite decreased mobility level and being a fall risk. Discussed recommendation for life alert and maximizing HH services (aide/PT/OT) however not sure if pt is open to this. Will follow acutely to maximize independence and mobility prior to return home. ?   ? ?Recommendations for follow up therapy are one component of a multi-disciplinary discharge planning process, led by the attending physician.  Recommendations may be updated based on patient status, additional functional criteria and insurance authorization. ? ?Follow Up Recommendations Home health PT ? ?  ?Assistance Recommended at Discharge Frequent or constant Supervision/Assistance  ?Patient can return home with the following ? A little help with bathing/dressing/bathroom;A lot of help with walking and/or transfers;Assistance with cooking/housework;Direct supervision/assist for medications management;Direct supervision/assist for financial management ? ?  ?Equipment Recommendations None recommended by PT  ?Recommendations for Other Services ?    ?   ?Functional Status Assessment Patient has had a recent decline in their functional status and demonstrates the ability to make significant improvements in function in a reasonable and predictable amount of time.  ? ?  ?Precautions / Restrictions Precautions ?Precautions: Fall;Other (comment) ?Precaution Comments: watch 02 ?Restrictions ?Weight Bearing Restrictions: No  ? ?  ? ?Mobility ? Bed Mobility ?Overal bed mobility: Needs Assistance ?Bed Mobility: Supine to Sit, Sit to Supine ?  ?  ?Supine to sit: Mod assist, HOB elevated ?Sit to supine: Supervision, HOB elevated ?  ?General bed mobility comments: Assist with trunk to get to EOB. ?  ? ?Transfers ?Overall transfer level: Needs assistance ?Equipment used: 1 person hand held assist ?Transfers: Sit to/from Stand ?Sit to Stand: Mod assist ?  ?  ?  ?  ?  ?General transfer comment: Mod A to power to standing from EOB x1. ?  ? ?Ambulation/Gait ?Ambulation/Gait assistance: Min assist ?Gait Distance (Feet): 3 Feet ?Assistive device: 1 person hand held assist ?  ?  ?  ?  ?General Gait Details: Able to side step along side bed with Min A for balance. Sp02 dropped to 86% on supplemental 02. Cues for pursed lip breathing. ? ?Stairs ?  ?  ?  ?  ?  ? ?Wheelchair Mobility ?  ? ?Modified Rankin (Stroke Patients Only) ?  ? ?  ? ?Balance Overall balance assessment: Needs assistance ?Sitting-balance support: Feet supported, Bilateral upper extremity supported ?Sitting balance-Leahy Scale: Fair ?  ?  ?Standing balance support: During functional activity ?Standing balance-Leahy Scale: Poor ?Standing balance comment: Requires external support ?  ?  ?  ?  ?  ?  ?  ?  ?  ?  ?  ?   ? ? ? ?Pertinent  Vitals/Pain Pain Assessment ?Pain Assessment: Faces ?Faces Pain Scale: Hurts little more ?Pain Location: LE- chronic ?Pain Descriptors / Indicators: Discomfort, Sore ?Pain Intervention(s): Monitored during session, Limited activity within patient's tolerance  ? ? ?Home Living Family/patient  expects to be discharged to:: Private residence ?Living Arrangements: Alone;Other relatives (granddaughter in and out) ?Available Help at Discharge: Family;Available PRN/intermittently ?Type of Home: House ?Home Access: Stairs to enter ?Entrance Stairs-Rails: Right ?Entrance Stairs-Number of Steps: 13- stair lift ?Alternate Level Stairs-Number of Steps: 14 ?Home Layout: Two level;Laundry or work area in basement ?Home Equipment: Conservation officer, nature (2 wheels);Shower seat;BSC/3in1;Cane - single point ?Additional Comments: 2 falls recently  ?  ?Prior Function Prior Level of Function : Needs assist ?  ?  ?  ?  ?  ?  ?Mobility Comments: Wears 02 baseline. Since admission in March, pt is a limited ambulator due to fatigue/LE weakness. Daughter helps with dressing/bathing. ?ADLs Comments: Daughter/granddaughter helps with groceries and cleaning ?  ? ? ?Hand Dominance  ? Dominant Hand: Right ? ?  ?Extremity/Trunk Assessment  ? Upper Extremity Assessment ?Upper Extremity Assessment: Generalized weakness ?  ? ?Lower Extremity Assessment ?Lower Extremity Assessment: Generalized weakness (Swelling present BLEs and into feet, chronic pain) ?  ? ?Cervical / Trunk Assessment ?Cervical / Trunk Assessment: Normal  ?Communication  ? Communication: No difficulties  ?Cognition Arousal/Alertness: Lethargic ?Behavior During Therapy: Flat affect ?Overall Cognitive Status: Within Functional Limits for tasks assessed ?  ?  ?  ?  ?  ?  ?  ?  ?  ?  ?  ?  ?  ?  ?  ?  ?General Comments: Reports feeling sleepy due to getting to room at 1 am. ?  ?  ? ?  ?General Comments General comments (skin integrity, edema, etc.): Daughter present during session. Sp02 dropped to 86% on 02 during session. ? ?  ?Exercises    ? ?Assessment/Plan  ?  ?PT Assessment Patient needs continued PT services  ?PT Problem List Decreased mobility;Decreased activity tolerance;Decreased balance;Decreased strength;Cardiopulmonary status limiting activity;Pain ? ?   ?  ?PT  Treatment Interventions Gait training;Stair training;Balance training;DME instruction;Therapeutic exercise;Functional mobility training;Therapeutic activities;Patient/family education   ? ?PT Goals (Current goals can be found in the Care Plan section)  ?Acute Rehab PT Goals ?Patient Stated Goal: sleep, go home ?PT Goal Formulation: With patient ?Time For Goal Achievement: 05/22/21 ?Potential to Achieve Goals: Fair ? ?  ?Frequency Min 3X/week ?  ? ? ?Co-evaluation   ?  ?  ?  ?  ? ? ?  ?AM-PAC PT "6 Clicks" Mobility  ?Outcome Measure Help needed turning from your back to your side while in a flat bed without using bedrails?: A Little ?Help needed moving from lying on your back to sitting on the side of a flat bed without using bedrails?: A Lot ?Help needed moving to and from a bed to a chair (including a wheelchair)?: A Lot ?Help needed standing up from a chair using your arms (e.g., wheelchair or bedside chair)?: A Lot ?Help needed to walk in hospital room?: A Little ?Help needed climbing 3-5 steps with a railing? : Total ?6 Click Score: 13 ? ?  ?End of Session Equipment Utilized During Treatment: Oxygen ?Activity Tolerance: Patient limited by fatigue;Patient limited by lethargy ?Patient left: in bed;with call bell/phone within reach;with bed alarm set;with family/visitor present ?Nurse Communication: Mobility status ?PT Visit Diagnosis: Difficulty in walking, not elsewhere classified (R26.2);Muscle weakness (generalized) (M62.81) ?  ? ?Time: 9211-9417 ?PT Time Calculation (min) (ACUTE  ONLY): 19 min ? ? ?Charges:   PT Evaluation ?$PT Eval Moderate Complexity: 1 Mod ?  ?  ?   ? ? ?Marisa Severin, PT, DPT ?Acute Rehabilitation Services ?Secure chat preferred ?Office [phone] ? ? ? ? ?[place] ?05/08/2021, 3:52 PM ? ?

## 2021-05-08 NOTE — Assessment & Plan Note (Addendum)
Continue glucose cover and monitoring with insulin sliding scale.  ?  ?Fasting glucose today at 107 mg/dl.  ?Patient with poor oral intake.  ?

## 2021-05-08 NOTE — H&P (Signed)
? ?History and Physical ? ?WAFA MARTES QZR:[id number] DOB: [birth year] DOA: 05/07/2021 ? ?Referring physician: Accepted by Dr. Velia Meyer, [facility]  ?PCP: Glenda Chroman, MD  ?Outpatient Specialists: Cardiology, pulmonary, GI ?Patient coming from: Home, through [facility] ? ?Chief Complaint: Shortness of breath and bilateral lower extremity edema. ? ?HPI: Jacqueline Harris is a 76 y.o. female with medical history significant for left lower extremity DVT off Eliquis, pericardial effusion, right heart failure, PAH on sildenafil and macitentan, ILD followed by pulmonary Dr. Elsworth Soho, chronic hypoxia on 2 L [place] continuously at baseline, HFpEF, esophageal candidiasis, gastric AVMs, iron deficiency anemia, who presented to [place] ED from home due to progressively worsening shortness of breath of 1 week.  Associated with generalized weakness and bilateral lower extremity edema.  Endorses compliance with her cardiac medications including her diuretics.  She has taken additional doses of diuretics over the course of the last week due to her dyspnea and edema with no significant improvement.  No chest pain.  No subjective fevers or productive cough.  She decided to come to the ED for further evaluation.  In the ED, she received 1 dose of IV Lasix 60 mg x 1.  TRH, hospitalist team, was asked to admit.  Patient was accepted at [facility] telemetry cardiac unit by Dr. Velia Meyer, [facility]. ? ?ED Course: Tmax 98.7.  BP 135/94, pulse 98, respiratory rate 21, saturation 92% on 2 L.  Lab studies remarkable for serum glucose 182, BUN 32, creatinine 1.34, GFR 42.  BNP 321.  Hemoglobin 9.6, at baseline.  MCV 72.3.  Platelet 348. ? ?Review of Systems: ?Review of systems as noted in the HPI. All other systems reviewed and are negative. ? ? ?Past Medical History:  ?Diagnosis Date  ? Acute deep vein thrombosis (DVT) of left peroneal vein (HCC)   ? November 2021  ? Anemia   ? Arthritis   ? CHF (congestive heart failure) ([place])   ? Colon polyps   ?  Community acquired pneumonia 11/21/2019  ? Essential hypertension   ? GAVE (gastric antral vascular ectasia)   ? HLD (hyperlipidemia)   ? Interstitial lung disease ([place])   ? Pneumonia due to COVID-19 virus   ? November 2021  ? Prediabetes   ? Pulmonary hypertension ([place])   ? ?Past Surgical History:  ?Procedure Laterality Date  ? BIOPSY  01/03/2021  ? Procedure: BIOPSY;  Surgeon: Sharyn Creamer, MD;  Location: [facility];  Service: Gastroenterology;;  ? BIOPSY  03/14/2021  ? Procedure: BIOPSY;  Surgeon: Sharyn Creamer, MD;  Location: [facility];  Service: Gastroenterology;;  ? CHOLECYSTECTOMY    ? COLONOSCOPY N/A 12/03/2014  ? Procedure: COLONOSCOPY;  Surgeon: Rogene Houston, MD;  [facility];  Service: Endoscopy;  Laterality: N/A;  830  ? COLONOSCOPY WITH PROPOFOL N/A 03/10/2020  ? Procedure: COLONOSCOPY WITH PROPOFOL;  Surgeon: Rogene Houston, MD;  [facility];  Service: Endoscopy;  Laterality: N/A;  1:15  ? ESOPHAGOGASTRODUODENOSCOPY (EGD) WITH PROPOFOL N/A 01/03/2021  ? Procedure: ESOPHAGOGASTRODUODENOSCOPY (EGD) WITH PROPOFOL;  Surgeon: Sharyn Creamer, MD;  [facility];  Service: Gastroenterology;  Laterality: N/A;  ? ESOPHAGOGASTRODUODENOSCOPY (EGD) WITH PROPOFOL N/A 03/14/2021  ? Procedure: ESOPHAGOGASTRODUODENOSCOPY (EGD) WITH PROPOFOL;  Surgeon: Sharyn Creamer, MD;  [facility];  Service: Gastroenterology;  Laterality: N/A;  ? HEMOSTASIS CLIP PLACEMENT  03/14/2021  ? Procedure: HEMOSTASIS CLIP PLACEMENT;  Surgeon: Sharyn Creamer, MD;  Location: [facility];  Service: Gastroenterology;;  ?  IR THORACENTESIS ASP PLEURAL SPACE W/IMG GUIDE  04/04/2021  ? POLYPECTOMY  03/10/2020  ? Procedure: POLYPECTOMY;  Surgeon: Rogene Houston, MD;  [facility];  Service: Endoscopy;;  ? RADIOFREQUENCY ABLATION  01/03/2021  ? Procedure: RADIO FREQUENCY ABLATION;  Surgeon: Sharyn Creamer, MD;  Location: [facility];  Service: Gastroenterology;;  ? RIGHT HEART CATH N/A  04/20/2020  ? Procedure: RIGHT HEART CATH;  Surgeon: Jolaine Artist, MD;  Location: [facility];  Service: Cardiovascular;  Laterality: N/A;  ? RIGHT HEART CATH N/A 12/31/2020  ? Procedure: RIGHT HEART CATH;  Surgeon: Jolaine Artist, MD;  Location: [facility];  Service: Cardiovascular;  Laterality: N/A;  ? RIGHT HEART CATH N/A 04/06/2021  ? Procedure: RIGHT HEART CATH;  Surgeon: Jolaine Artist, MD;  Location: [facility];  Service: Cardiovascular;  Laterality: N/A;  ? ? ?Social History:  reports that she quit smoking about 38 years ago. Her smoking use included cigarettes. She has a 6.50 pack-year smoking history. She has never used smokeless tobacco. She reports that she does not drink alcohol and does not use drugs. ? ? ?Allergies  ?Allergen Reactions  ? Sildenafil   ?  Blurry vision with large doses  ? Spironolactone Rash  ? ? ?Family History  ?Problem Relation Age of Onset  ? Cancer - Lung Mother   ? Bladder Cancer Father   ? Diabetes Father   ? COPD Father   ? Hypertension Sister   ? Heart disease Sister   ? Other Sister   ?     GAVE  ? Diabetes Sister   ? Cirrhosis Sister   ? Other Sister   ?     GAVE  ? Cancer - Colon Brother   ? Diabetes Brother   ? Heart disease Brother   ? Thyroid disease Son   ? Depression Son   ? Anxiety disorder Son   ? Hyperlipidemia Son   ?  ? ? ?Prior to Admission medications   ?Medication Sig Start Date End Date Taking? Authorizing Provider  ?acetaminophen (TYLENOL) 650 MG CR tablet Take 650 mg by mouth every 8 (eight) hours as needed for pain.    [provider]  ?ALPRAZolam Duanne Moron) 0.5 MG tablet Take 0.5 mg by mouth at bedtime as needed for anxiety.    [provider]  ?citalopram (CELEXA) 20 MG tablet Take 20 mg by mouth daily.    [provider]  ?ferrous sulfate 325 (65 FE) MG EC tablet Take 1 tablet (325 mg total) by mouth daily. 04/12/21   Levin Erp, PA  ?hydrOXYzine (VISTARIL) 25 MG capsule Take 25 mg  by mouth at bedtime as needed (sleep). 02/24/21   [provider]  ?macitentan (OPSUMIT) 10 MG tablet Take 1 tablet (10 mg total) by mouth daily. 01/18/21   Bensimhon, Shaune Pascal, MD  ?metolazone (ZAROXOLYN) 2.5 MG tablet Take only as directed by CHF clinic. 04/26/21   Bensimhon, Shaune Pascal, MD  ?Multiple Vitamins-Minerals (MULTIVITAMIN WITH MINERALS) tablet Take 1 tablet by mouth daily.    [provider]  ?omeprazole (PRILOSEC) 40 MG capsule Take 1 capsule (40 mg total) by mouth 2 (two) times daily. 03/18/21 06/10/21  Sharyn Creamer, MD  ?potassium chloride (KLOR-CON) 10 MEQ tablet TAKE 1 TABLET BY MOUTH EVERY DAY ?Patient taking differently: Take 10 mEq by mouth every other day. 03/31/21   Bensimhon, Shaune Pascal, MD  ?sildenafil (REVATIO) 20 MG tablet Take 1 tablet (  20 mg total) by mouth 3 (three) times daily. 03/28/21   Bensimhon, Shaune Pascal, MD  ?torsemide (DEMADEX) 20 MG tablet Take 57m daily for three days and then take 243mevery other day. ?Patient taking differently: take 2068mvery other day. 04/12/21   MilRafael BihariNP  ? ? ?Physical Exam: ?BP (!) 135/94 (BP Location: Left Arm)   Pulse 98   Temp 98.7 ?F (37.1 ?C) (Oral)   Resp (!) 21   SpO2 92%  ? ?General: 75 42o. year-old female very weak appearing in no acute distress.  Alert and oriented x3. ?Cardiovascular: Regular rate and rhythm with no rubs or gallops.  No thyromegaly or JVD noted.  2+ pitting edema in lower extremity bilaterally. 2/4 pulses in all 4 extremities. ?Respiratory: Diffuse rales bilaterally.  Poor inspiratory effort. ?Abdomen: Soft nontender nondistended with normal bowel sounds x4 quadrants. ?Muskuloskeletal: No cyanosis or clubbing.  2+ pitting edema in lower extremities bilaterally.   ?Neuro: CN II-XII intact, strength, sensation, reflexes ?Skin: No ulcerative lesions noted or rashes ?Psychiatry: Judgement and insight appear normal. Mood is appropriate for condition and setting ?   ?   ?   ?Labs on Admission:  ?Basic  Metabolic Panel: ?Recent Labs  ?Lab 05/04/21 ?1456294/22/23 ?1932  ?NA 138 139  ?K 3.5 3.5  ?CL 108 107  ?CO2 24 23  ?GLUCOSE 117* 182*  ?BUN 35* 32*  ?CREATININE 1.53* 1.34*  ?CALCIUM 8.0* 8.3*  ? ?[place]

## 2021-05-08 NOTE — Progress Notes (Addendum)
? ?Progress Note ? ?Patient Name: Jacqueline Harris ?Date of Encounter: 05/08/2021 ? ?Primary Cardiologist: Rozann Lesches, MD  ? ?Subjective  ? ?C/o dyspnea.  ? ?Inpatient Medications  ?  ?Scheduled Meds: ? citalopram  20 mg Oral Daily  ? enoxaparin (LOVENOX) injection  40 mg Subcutaneous Q24H  ? ferrous sulfate  325 mg Oral Q breakfast  ? furosemide  60 mg Intravenous BID  ? insulin aspart  0-5 Units Subcutaneous QHS  ? insulin aspart  0-9 Units Subcutaneous TID WC  ? ipratropium-albuterol  3 mL Nebulization Q6H  ? macitentan  10 mg Oral Daily  ? multivitamin with minerals  1 tablet Oral Daily  ? pantoprazole  40 mg Oral BID  ? potassium chloride  10 mEq Oral Daily  ? sildenafil  20 mg Oral TID  ? ?Continuous Infusions: ? ?PRN Meds: ?acetaminophen, albuterol, ALPRAZolam, hydrOXYzine, polyethylene glycol, prochlorperazine  ? ?Vital Signs  ?  ?Vitals:  ? 05/08/21 0121 05/08/21 0127 05/08/21 0500 05/08/21 0827  ?BP: (!) 135/94 (!) 135/94    ?Pulse: 98 98  (!) 105  ?Resp: (!) 21 (!) 21  (!) 24  ?Temp: 98.7 ?F (37.1 ?C) 98.7 ?F (37.1 ?C)    ?TempSrc: Oral     ?SpO2: 92%   95%  ?Weight:   61.6 kg   ? ? ?Intake/Output Summary (Last 24 hours) at 05/08/2021 0905 ?Last data filed at 05/08/2021 0102 ?Gross per 24 hour  ?Intake --  ?Output 1400 ml  ?Net -1400 ml  ? ?Filed Weights  ? 05/08/21 0500  ?Weight: 61.6 kg  ? ? ?Telemetry  ?  ?Sinus tachy - Personally Reviewed ? ?ECG  ?  ?none - Personally Reviewed ? ?Physical Exam  ? ?GEN: chronically ill appearing, dyspneic   ?Neck: 6 cm JVD ?Cardiac: Reg tachy, no murmurs, rubs, or gallops.  ?Respiratory: scattered fine pitched rales and coarse rhonchi ?GI: Soft, nontender, non-distended  ?MS: 1+ edema; No deformity. ?Neuro:  Nonfocal  ?Psych: Normal affect  ? ?Labs  ?  ?Chemistry ?Recent Labs  ?Lab 05/04/21 ?1457 05/07/21 ?1932 05/08/21 ?0137  ?NA 138 139 138  ?K 3.5 3.5 3.5  ?CL 108 107 105  ?CO2 _0 ?GLUCOSE 117* 182* 143*  ?BUN 35* 32* 30*  ?CREATININE 1.53* 1.34* 1.25*   ?CALCIUM 8.0* 8.3* 8.1*  ?PROT  --  5.8* 5.4*  ?ALBUMIN  --  2.7* 2.1*  ?AST  --  13* 15  ?ALT  --  5 10  ?ALKPHOS  --  50 51  ?BILITOT  --  0.4 0.7  ?GFRNONAA 36* 42* 46*  ?ANIONGAP _1 ?  ? ?Hematology ?Recent Labs  ?Lab 05/07/21 ?1932 05/08/21 ?0137  ?WBC 8.2 7.2  6.9  ?RBC 4.58 4.37  4.35  ?HGB 9.6* 9.5*  9.2*  ?HCT 33.1* 31.7*  31.7*  ?MCV 72.3* 72.5*  72.9*  ?MCH 21.0* 21.7*  21.1*  ?MCHC 29.0* 30.0  29.0*  ?RDW 26.5* 26.2*  25.9*  ?PLT 348 335  332  ? ? ?Cardiac EnzymesNo results for input(s): TROPONINI in the last 168 hours. No results for input(s): TROPIPOC in the last 168 hours.  ? ?BNP ?Recent Labs  ?Lab 05/07/21 ?1932  ?BNP 321.1*  ?  ? ?DDimer No results for input(s): DDIMER in the last 168 hours.  ? ?Radiology  ?  ?DG Chest Port 1 View ? ?Result Date: 05/07/2021 ?CLINICAL DATA:  Shortness of breath. EXAM: PORTABLE CHEST 1 VIEW COMPARISON:  04/04/2021 chest  radiograph FINDINGS: Cardiomegaly again noted. Pulmonary vascular congestion and mild interstitial opacities are noted. Slightly increasing LEFT pleural effusion/LEFT LOWER lung atelectasis noted. A small RIGHT pleural effusion is noted. No pneumothorax or acute bony abnormality. IMPRESSION: 1. Cardiomegaly with pulmonary vascular congestion, mild interstitial edema and slightly increasing LEFT pleural effusion/LEFT LOWER lung atelectasis. 2. Small RIGHT pleural effusion. Electronically Signed   By: Margarette Canada M.D.   On: 05/07/2021 18:17   ? ?Cardiac Studies  ? ?Echo pending ? ?Patient Profile  ?   ?76 y.o. female admitted with respiratory failure, known pulm HTN, and ILD. She is 10 lbs over her dry weight from DC in 3/23. ? ?Assessment & Plan  ?  ?Acute on chronic diastolic heart failure - she is 10 lbs up. She will need IV diuresis. ?Pulm HTN - continue medical therapy with Revatio. Her prognosis is guarded ?Hypoxemia - I suspect she is off of her baseline. Continue supplemental oxygen. Check an ESR. I am always concerned about  exacerbation of ILD.  ?Sinus tachycardia - a concerning sign. Treat volume overload. ?   ? ?For questions or updates, please contact [place] ?Please consult [email or web address] for contact info under Cardiology/STEMI. ?  ?   ?Signed, ?Cristopher Peru, MD  ?05/08/2021, 9:05 AM    ?

## 2021-05-08 NOTE — ED Notes (Signed)
2120 hrs ?Received verbal order to disregard 2nd blood draw for Troponin and Lactic acid per ER physician. ?

## 2021-05-08 NOTE — Assessment & Plan Note (Addendum)
Anemia of chronic disease with anemia on iron deficiency.  ?Hgb is 9,5 range  ?On oral iron supplementation.  ?Continue close follow up of cell count.  ?

## 2021-05-08 NOTE — Consult Note (Signed)
?Cardiology Consultation:  ? ?Patient ID: Davy Pique ?MRN: [id number]; DOB: 01-Nov-1945 ? ?Admit date: 05/07/2021 ?Date of Consult: 05/08/2021 ? ?PCP:  Glenda Chroman, MD ?  ?[facility] Providers ?Cardiologist:  Rozann Lesches, MD      ? ? ?Patient Profile:  ? ?IARA MONDS is a 76 y.o. female with a hx of  left lower extremity DVT off Eliquis, pericardial effusion, right heart failure, PAH on sildenafil and macitentan, ILD followed by pulmonary Dr. Elsworth Soho, chronic hypoxia on 2 L [place] continuously at baseline, HFpEF/diastolic CHF, esophageal candidiasis, gastric AVMs, iron deficiency anemiawho is being seen 05/08/2021 for the evaluation of SOB/LE edema/CHF exacerbation at the request of Dr Nevada Crane. ? ?History of Present Illness:  ? ?FELICE HOPE is a 76 y.o. female with a hx of  left lower extremity DVT off Eliquis, pericardial effusion, right heart failure, PAH on sildenafil and macitentan, ILD followed by pulmonary Dr. Elsworth Soho, chronic hypoxia on 2 L [place] continuously at baseline, HFpEF/diastolic CHF, esophageal candidiasis, gastric AVMs, iron deficiency anemiawho is being seen 05/08/2021 for the evaluation of SOB/LE edema/CHF exacerbation  ? ?Patient presented with c/o progressive worsening SOB since [redacted] week along with LE edema and generalized weakness, does report orthopnea, and PND as well as exertional dyspnea (NYHA class III symptoms) . She has been compliant with meds and took torsemide and metolazone as per Adv HF instructions ?Denies any URTI, chest pain. ?At the ER: she got IV lasix 62m x1 [facility]. ? ?Hypoxic to 89% on 2lts, BP was 143/63, BNP 321 (previous 136) ?Trops negative ?LA 2.6->2.0 ?EKG shows NSR, low voltage in precordial leads ? ?Last Admitted 3/23 with acute respiratory failure with hypoxia 2/2 acute on chronic CHF and b/l pleural effusions.Echo showed EF 55% with mildly D-shaped septum; RV mildly reduced and enlarged, mild-moderate TR,  moderate pericardial effusion (smaller than prior). Underwent L thoracentesis w/ 850 mL straw colored fluid removal. Fluid concerning for exudative effusion (GS, cultures negative, cytology negative for malignant cells).  She was diuresed with IV lasix and underwent RHC showing mild to moderate PAH with high output, normal left sided filling pressures, and no evidence of intracardiac shunting. Echo with bubble study, poor quality but positive for intrapulmonary shunting. Hospitalization c/b AKI and anemia, received PRBCs and IV iron.  GDMT titrated, and she was discharged home with supplemental oxygen, weight 124 lbs. ?  ? ?Prior cardiac work up:  ?ECHO: 03/2021 ?IMPRESSIONS  ? ? ? 1. Poor quality bubble study. There appear to be bubbles in the LV >6  ?cardiac cycles suggestive of intrapulmonary shunting. Do not suspect  ?interatrial shunt.  ? 2. Left ventricular ejection fraction, by estimation, is 60 to 65%. The  ?left ventricle has normal function.  ? 3. Right ventricular systolic function is moderately reduced. The right  ?ventricular size is moderately enlarged.  ? 4. Agitated saline contrast bubble study was positive with shunting  ?observed after >6 cardiac cycles suggestive of intrapulmonary shunting.  ? ?ECHO: 03/2021 ? 1. Effusion smaller than seen on TTE 12/15/20.  ? 2. Left ventricular ejection fraction, by estimation, is 50 to 55%. The  ?left ventricle has low normal function. The left ventricle has no regional  ?wall motion abnormalities. Left ventricular diastolic parameters were  ?normal.  ? 3. Right ventricular systolic function is mildly reduced. The right  ?ventricular size is mildly enlarged. There is severely elevated pulmonary  ?artery systolic pressure.  ? 4.  Left atrial size was mildly dilated.  ? 5. Right atrial size was mildly dilated.  ? 6. A small pericardial effusion is present. The pericardial effusion is  ?posterior to the left ventricle and anterior to the right ventricle.  ? 7. The  mitral valve is abnormal. Mild mitral valve regurgitation. No  ?evidence of mitral stenosis.  ? 8. Tricuspid valve regurgitation is mild to moderate.  ? 9. The aortic valve is tricuspid. There is mild calcification of the  ?aortic valve. There is mild thickening of the aortic valve. Aortic valve  ?regurgitation is not visualized. Aortic valve sclerosis is present, with  ?no evidence of aortic valve stenosis.  ?10. The inferior vena cava is normal in size with greater than 50%  ?respiratory variability, suggesting right atrial pressure of 3 mmHg.  ? ? ?RHC: 04/06/21 ?Findings: ?  ?RA = 6 ?RV = 61/10 ?PA = 63/16 (37) ?PCW = 9 ?Fick cardiac output/index = 9.1/5.7 ?PVR = 3.1 WU ?FA sat = 95% ?PA sat = 75%, 76% ?SVC sat = 79%  ?PAPi = 7.8  ?  ?Assessment: ?1. Mild to moderate PAH with high output ?2. Normal left-sided filling pressures ?3. No evidence of intracardiac shunting ?  ?Plan/Discussion: ?  ?PA pressures significantly improved since previous. There is a component of high output PAH. ? ? ? ?Past Medical History:  ?Diagnosis Date  ? Acute deep vein thrombosis (DVT) of left peroneal vein (HCC)   ? November 2021  ? Anemia   ? Arthritis   ? CHF (congestive heart failure) ([place])   ? Colon polyps   ? Community acquired pneumonia 11/21/2019  ? Essential hypertension   ? GAVE (gastric antral vascular ectasia)   ? HLD (hyperlipidemia)   ? Interstitial lung disease ([place])   ? Pneumonia due to COVID-19 virus   ? November 2021  ? Prediabetes   ? Pulmonary hypertension ([place])   ? ? ?Past Surgical History:  ?Procedure Laterality Date  ? BIOPSY  01/03/2021  ? Procedure: BIOPSY;  Surgeon: Sharyn Creamer, MD;  Location: [facility];  Service: Gastroenterology;;  ? BIOPSY  03/14/2021  ? Procedure: BIOPSY;  Surgeon: Sharyn Creamer, MD;  Location: [facility];  Service: Gastroenterology;;  ? CHOLECYSTECTOMY    ? COLONOSCOPY N/A 12/03/2014  ? Procedure: COLONOSCOPY;  Surgeon: Rogene Houston, MD;  [facility];  Service:  Endoscopy;  Laterality: N/A;  830  ? COLONOSCOPY WITH PROPOFOL N/A 03/10/2020  ? Procedure: COLONOSCOPY WITH PROPOFOL;  Surgeon: Rogene Houston, MD;  [facility];  Service: Endoscopy;  Laterality: N/A;  1:15  ? ESOPHAGOGASTRODUODENOSCOPY (EGD) WITH PROPOFOL N/A 01/03/2021  ? Procedure: ESOPHAGOGASTRODUODENOSCOPY (EGD) WITH PROPOFOL;  Surgeon: Sharyn Creamer, MD;  [facility];  Service: Gastroenterology;  Laterality: N/A;  ? ESOPHAGOGASTRODUODENOSCOPY (EGD) WITH PROPOFOL N/A 03/14/2021  ? Procedure: ESOPHAGOGASTRODUODENOSCOPY (EGD) WITH PROPOFOL;  Surgeon: Sharyn Creamer, MD;  [facility];  Service: Gastroenterology;  Laterality: N/A;  ? HEMOSTASIS CLIP PLACEMENT  03/14/2021  ? Procedure: HEMOSTASIS CLIP PLACEMENT;  Surgeon: Sharyn Creamer, MD;  Location: [facility];  Service: Gastroenterology;;  ? IR THORACENTESIS ASP PLEURAL SPACE W/IMG GUIDE  04/04/2021  ? POLYPECTOMY  03/10/2020  ? Procedure: POLYPECTOMY;  Surgeon: Rogene Houston, MD;  [facility];  Service: Endoscopy;;  ? RADIOFREQUENCY ABLATION  01/03/2021  ? Procedure: RADIO FREQUENCY ABLATION;  Surgeon: Sharyn Creamer, MD;  Location: [facility];  Service: Gastroenterology;;  ? RIGHT HEART CATH N/A  04/20/2020  ? Procedure: RIGHT HEART CATH;  Surgeon: Jolaine Artist, MD;  Location: [facility];  Service: Cardiovascular;  Laterality: N/A;  ? RIGHT HEART CATH N/A 12/31/2020  ? Procedure: RIGHT HEART CATH;  Surgeon: Jolaine Artist, MD;  Location: [facility];  Service: Cardiovascular;  Laterality: N/A;  ? RIGHT HEART CATH N/A 04/06/2021  ? Procedure: RIGHT HEART CATH;  Surgeon: Jolaine Artist, MD;  Location: [facility];  Service: Cardiovascular;  Laterality: N/A;  ?  ? ?Home Medications:  ?Prior to Admission medications   ?Medication Sig Start Date End Date Taking? Authorizing Provider  ?acetaminophen (TYLENOL) 650 MG CR tablet Take 650 mg by mouth every 8 (eight) hours as needed  for pain.    [provider]  ?ALPRAZolam Duanne Moron) 0.5 MG tablet Take 0.5 mg by mouth at bedtime as needed for anxiety.    [provider]  ?citalopram (CELEXA) 20 MG tablet Take 20 mg by mouth dai

## 2021-05-08 NOTE — Assessment & Plan Note (Addendum)
Echocardiogram with preserved LV systolic function with EF 50 to 90%, RV systolic function with mild to moderate reduction, moderate enlarged RV cavity. Severe elevated pulmonary artery pressure. RVSP 69.2 mmHg.  ?Small pericardial effusion, mild to moderate tricuspid regurgitation. ? ?Urine output 2,550 ml over last 24 hrs ?Blood pressure systolic is 122 to 241 mmHg.  ? ?Currently on furosemide 40 mg daily ?Continue volume overload, add 60 mg Iv furosemide x1 ? ?Patient with poor prognosis due to pulmonary hypertension.  ? ?Code status has been changed to DNR ?Follow up with palliative care.  ? ?

## 2021-05-08 NOTE — Plan of Care (Signed)
?  Problem: Education: ?Goal: Knowledge of General Education information will improve ?Description: Including pain rating scale, medication(s)/side effects and non-pharmacologic comfort measures ?Outcome: Progressing ?  ?Problem: Health Behavior/Discharge Planning: ?Goal: Ability to manage health-related needs will improve ?Outcome: Progressing ?  ?Problem: Clinical Measurements: ?Goal: Ability to maintain clinical measurements within normal limits will improve ?Outcome: Progressing ?Goal: Will remain free from infection ?Outcome: Progressing ?Goal: Diagnostic test results will improve ?Outcome: Progressing ?Goal: Respiratory complications will improve ?Outcome: Progressing ?Goal: Cardiovascular complication will be avoided ?Outcome: Progressing ?  ?Problem: Activity: ?Goal: Risk for activity intolerance will decrease ?Outcome: Progressing ?  ?Problem: Nutrition: ?Goal: Adequate nutrition will be maintained ?Outcome: Progressing ?  ?Problem: Coping: ?Goal: Level of anxiety will decrease ?Outcome: Progressing ?  ?Problem: Elimination: ?Goal: Will not experience complications related to bowel motility ?Outcome: Progressing ?Goal: Will not experience complications related to urinary retention ?Outcome: Progressing ?  ?Problem: Pain Managment: ?Goal: General experience of comfort will improve ?Outcome: Progressing ?  ?Problem: Safety: ?Goal: Ability to remain free from injury will improve ?Outcome: Progressing ?  ?Problem: Skin Integrity: ?Goal: Risk for impaired skin integrity will decrease ?Outcome: Progressing ?  ?

## 2021-05-08 NOTE — Progress Notes (Signed)
?Progress Note ? ? ?Patient: Jacqueline Harris ZOX:[id number] DOB: 10/05/1945 DOA: 05/07/2021     0 ?DOS: the patient was seen and examined on 05/08/2021 ?  ?Brief hospital course: ?Jacqueline Harris was admitted to the hospital with the working diagnosis of decompensated heart failure.  ? ?76 yo female with the past medical history of heart failure, pulmonary hypertension, interstitial lung disease, chronic hypoxemic respiratory failure who presented with dyspnea and lower extremity edema. Reported worsening symptoms for the last 7 days, despite increase dose of diuretic therapy at home.  ?Recent hospitalization 03/19 to 03/22 for decompensated heart failure. Required left thoracentesis, 850 cc of exudate fluid.  ?On his initial physical examination her blood pressure was 135/94, HR 98, RR 21 02 saturation 92% on 2 L per min per [place], lungs with diffuse rales bilaterally, no wheezing, poor inspiratory effort, heart with S1 and S2 present and rhythmic, no gallops, abdomen not distended and positive lower extremity edema ++.  ? ?Na 139, K 3,5 Cl 107, bicarbonate 23, glucose 182, bun 32 cr 1,34 ?BNP 321 ?High sensitive troponin 9 and 13.  ?Lactic acid 2,6 ?Wbc 8.2 hgb 9,6 plt 348  ? ?Chest radiograph with cardiomegaly, bilateral hilar vascular congestion with bilateral interstitial infiltrates, more predominant to the left lower lobe, bilateral pleural effusions more left than right.  ? ?EKG 89 bpm, left axis deviation, normal intervals, sinus rhythm with low voltage, no significant ST segment or T wave changes.  ? ?Patient was placed on furosemide for diuresis.  ? ?Assessment and Plan: ?* Acute on chronic diastolic heart failure ([place]) ?Echocardiogram with preserved LV systolic function with EF 50 to 81%, RV systolic function with mild to moderate reduction, moderate enlarged RV cavity. Severe elevated pulmonary artery pressure. RVSP 69.2 mmHg.  ?Small pericardial effusion, mild to moderate tricuspid regurgitation. ? ?Urine output  1,400 cc over last 24 hrs ?Blood pressure systolic is 191 to 478 mmHg.  ? ?Contin diuresis with furosemide 60 mg IV q12  ? ? ?Pulmonary hypertension ([place]) ?Interstitial lung disease.  ?Sever pulmonary hypertension with acute on chronic hypoxemic respiratory failure ?Acute pulmonary edema with bilateral pleural effusions.  ? ?Plan to continue diuresis with IV furosemide to target negative fluid balance. ?Continue with sildenafil, macitetan and bronchodilator therapy.  ?Supplemental 02 per [place] to keep 02 saturation 88% or greater.  ? ?Acute kidney injury superimposed on chronic kidney disease ([place]) ?CKD stage 3B ? ?Renal function with serum cr at 1,25. K is 3,5 and serum bicarbonate at 26.  ?Plan to continue diuresis with furosemide ?Add 40 meq Kcl to prevent hypokalemia.  ?Follow up renal function in am.   ? ?Type 2 diabetes mellitus with hyperlipidemia ([place]) ?Continue glucose cover and monitoring with insulin sliding scale.  ?Patient with poor oral intake. ?Consult nutrition for evaluation.  ? ?Anemia ?Anemia of chronic disease with anemia or iron deficiency.  ?Hgb is 9,5 range  ?Continue close follow up of cell count.  ? ? ? ? ?  ? ?Subjective: Patient continue to be very weak and deconditioned, dyspnea and edema have improved some but not back to baseline  ? ?Physical Exam: ?Vitals:  ? 05/08/21 0827 05/08/21 0954 05/08/21 1154 05/08/21 1314  ?BP:  (!) 133/45 (!) 113/40   ?Pulse: (!) 105 (!) 102 98 91  ?Resp: (!) 24 (!) 26 (!) 25 19  ?Temp:  99 ?F (37.2 ?C) 98.6 ?F (37 ?C)   ?TempSrc:  Oral Oral   ?SpO2: 95% 93% 91% 95%  ?Weight:      ? ?  Neurology awake and alert, very weak and deconditioned ?ENT with mild pallor ?Cardiovascular with S1 and S2 present and rhythmic with loud P2 and 3/6 systolic murmur at the right sternal border, no gallops or rubs ?Positive JVD moderate ?Respiratory with bilateral rales anterior auscultation with no wheezing ?Abdomen not distended ? ?Data Reviewed: ? ? ? ?Family Communication: I  spoke with patient's son in law at the bedside, we talked in detail about patient's condition, plan of care and prognosis and all questions were addressed. ? ? ?Disposition: ?Status is: Inpatient ?Remains inpatient appropriate because: heart failure  ? Planned Discharge Destination: Home ? ? ?Author: ?Tawni Millers, MD ?05/08/2021 2:49 PM ? ?For on call review [email or web address].  ?

## 2021-05-08 NOTE — Hospital Course (Addendum)
Jacqueline Harris was admitted to the hospital with the working diagnosis of decompensated heart failure in the setting of severe pulmonary hypertension.   ? ?76 yo female with the past medical history of heart failure, pulmonary hypertension, interstitial lung disease, chronic hypoxemic respiratory failure who presented with dyspnea and lower extremity edema. Reported worsening symptoms for the last 7 days, despite increase dose of diuretic therapy at home.  ?Recent hospitalization 03/19 to 03/22 for decompensated heart failure. Required left thoracentesis, 850 cc of exudate fluid.  ?On his initial physical examination her blood pressure was 135/94, HR 98, RR 21 02 saturation 92% on 2 L per min per [place], lungs with diffuse rales bilaterally, no wheezing, poor inspiratory effort, heart with S1 and S2 present and rhythmic, no gallops, abdomen not distended and positive lower extremity edema ++.  ? ?Na 139, K 3,5 Cl 107, bicarbonate 23, glucose 182, bun 32 cr 1,34 ?BNP 321 ?High sensitive troponin 9 and 13.  ?Lactic acid 2,6 ?Wbc 8.2 hgb 9,6 plt 348  ? ?Chest radiograph with cardiomegaly, bilateral hilar vascular congestion with bilateral interstitial infiltrates, more predominant to the left lower lobe, bilateral pleural effusions more left than right.  ? ?EKG 89 bpm, left axis deviation, normal intervals, sinus rhythm with low voltage, no significant ST segment or T wave changes.  ? ?Patient was placed on furosemide for diuresis.  ?Poor prognosis considering pulmonary hypertension and ILD.  ?Palliative care services consulted.  ?

## 2021-05-08 NOTE — Assessment & Plan Note (Addendum)
Interstitial lung disease. UIP ?Sever pulmonary hypertension with acute on chronic hypoxemic respiratory failure ?Acute pulmonary edema with bilateral pleural effusions.  ? ?Diuresis with furosemide, to target further negative fluid balance.  ?Continue with sildenafil, macitetan and bronchodilator therapy.  ? ?Oxymetry is 95% on 3.5 L/min per [place]. ? ?No clinical signs of ILD flare at this point, most of her symptoms due to decompensated heart failure, acute on chronic core [place].  ?Keep 02 saturation 88% or greater, continue oxymetry monitoring.  ?

## 2021-05-08 NOTE — Assessment & Plan Note (Addendum)
CKD stage 3B ? ?Continue hypervolemia.  ?Continue diuresis with furosemide, add one IV dose today. ? ?Add 40 meq Kcl to prevent hypokalemia and check electrolytes in am, including Mg. ?Follow up renal function in am.  ?Avoid hypotension and nephrotoxic medications.  ?

## 2021-05-09 DIAGNOSIS — N179 Acute kidney failure, unspecified: Secondary | ICD-10-CM | POA: Diagnosis not present

## 2021-05-09 DIAGNOSIS — E1169 Type 2 diabetes mellitus with other specified complication: Secondary | ICD-10-CM | POA: Diagnosis not present

## 2021-05-09 DIAGNOSIS — F32A Depression, unspecified: Secondary | ICD-10-CM | POA: Diagnosis present

## 2021-05-09 DIAGNOSIS — I272 Pulmonary hypertension, unspecified: Secondary | ICD-10-CM | POA: Diagnosis not present

## 2021-05-09 DIAGNOSIS — I5033 Acute on chronic diastolic (congestive) heart failure: Secondary | ICD-10-CM | POA: Diagnosis not present

## 2021-05-09 LAB — BASIC METABOLIC PANEL
Anion gap: 5 (ref 5–15)
BUN: 32 mg/dL — ABNORMAL HIGH (ref 8–23)
CO2: 26 mmol/L (ref 22–32)
Calcium: 7.8 mg/dL — ABNORMAL LOW (ref 8.9–10.3)
Chloride: 106 mmol/L (ref 98–111)
Creatinine, Ser: 1.38 mg/dL — ABNORMAL HIGH (ref 0.44–1.00)
GFR, Estimated: 40 mL/min — ABNORMAL LOW (ref >60)
Glucose, Bld: 109 mg/dL — ABNORMAL HIGH (ref 70–99)
Potassium: 4 mmol/L (ref 3.5–5.1)
Sodium: 137 mmol/L (ref 135–145)

## 2021-05-09 LAB — GLUCOSE, CAPILLARY
Glucose-Capillary: 202 mg/dL — ABNORMAL HIGH (ref 70–99)
Glucose-Capillary: 120 mg/dL — ABNORMAL HIGH (ref 70–99)

## 2021-05-09 MED ORDER — ENSURE ENLIVE PO LIQD
237.0000 mL | Freq: Two times a day (BID) | ORAL | Status: DC
  Administered 2021-05-15 – 2021-05-16 (×2): 237 mL via ORAL

## 2021-05-09 MED ORDER — TOBRAMYCIN 0.3 % OP OINT
TOPICAL_OINTMENT | Freq: Four times a day (QID) | OPHTHALMIC | Status: DC
  Filled 2021-05-09 (×2): qty 3.5

## 2021-05-09 NOTE — Progress Notes (Signed)
Physical Therapy Treatment ?Patient Details ?Name: Jacqueline Harris ?MRN: [id number] ?DOB: 01-12-1946 ?Today's Date: 05/09/2021 ? ? ?History of Present Illness Patient is a 76 y/o female who presents on 4/22 with SOB and LE swelling. Admitted with acute on chronic diastolic CHF. Last admission 3/23 for respiratory failure with hypoxia secondary to CHF. PMH includes HTN, GAVE syndrome, CHF. ? ?  ?PT Comments  ? ? Pt received in supine, agreeable to therapy session with emphasis on transfer training and seated/standing balance. Pt dyspneic 2/4 with transition to EOB/standing transfers and needed O2 increased to 4L due to desat to 86% on 3L [place] with standing. Pt able to take pivotal steps to/from EOB<>BSC with RW support and up to +2 minA. Pt had BM (NT notified) and bed sheet changed as they were saturated upon therapist entrance despite briefs and [place]. Pt continues to benefit from PT services to progress toward functional mobility goals.    ?Recommendations for follow up therapy are one component of a multi-disciplinary discharge planning process, led by the attending physician.  Recommendations may be updated based on patient status, additional functional criteria and insurance authorization. ? ?Follow Up Recommendations ? Home health PT ?  ?  ?Assistance Recommended at Discharge Frequent or constant Supervision/Assistance  ?Patient can return home with the following A little help with bathing/dressing/bathroom;A lot of help with walking and/or transfers;Assistance with cooking/housework;Direct supervision/assist for medications management;Direct supervision/assist for financial management;Help with stairs or ramp for entrance;Assist for transportation ?  ?Equipment Recommendations ? None recommended by PT  ?  ?Recommendations for Other Services   ? ? ?  ?Precautions / Restrictions Precautions ?Precautions: Fall;Other (comment) ?Precaution Comments: watch 02, bladder incontinence ?Restrictions ?Weight Bearing  Restrictions: No  ?  ? ?Mobility ? Bed Mobility ?Overal bed mobility: Needs Assistance ?Bed Mobility: Supine to Sit, Sit to Supine ?  ?  ?Supine to sit: HOB elevated, Min assist ?Sit to supine: Min assist ?  ?General bed mobility comments: Assist with BLE to clear EOB due to fatigue; pt with good initiation but reliant on bed rail to get to EOB ?  ? ?Transfers ?Overall transfer level: Needs assistance ?Equipment used: Rolling walker (2 wheels) ?Transfers: Sit to/from Stand, Bed to chair/wheelchair/BSC ?Sit to Stand: Min assist, +2 physical assistance ?  ?Step pivot transfers: Min assist, +2 physical assistance ?  ?  ?  ?General transfer comment: cues for UE placement needed and increased time/effort to rise; pt anxious with sitting and needs hand over hand assist to locate arm rests of BSC ?  ? ?Ambulation/Gait ?  ?  ?  ?  ?  ?  ?  ?  ? ? ?Stairs ?  ?  ?  ?  ?  ? ? ?Wheelchair Mobility ?  ? ?Modified Rankin (Stroke Patients Only) ?  ? ? ?  ?Balance Overall balance assessment: Needs assistance ?Sitting-balance support: Feet supported, Single extremity supported, Bilateral upper extremity supported ?Sitting balance-Leahy Scale: Fair ?  ?  ?Standing balance support: Bilateral upper extremity supported ?Standing balance-Leahy Scale: Poor ?Standing balance comment: reliant on RW and +1-2 assist for safety/due to pt anxiety ?  ?  ?  ?  ?  ?  ?  ?  ?  ?  ?  ?  ? ?  ?Cognition Arousal/Alertness: Awake/alert ?Behavior During Therapy: Flat affect ?Overall Cognitive Status: Within Functional Limits for tasks assessed ?  ?  ?  ?  ?  ?  ?  ?  ?  ?  ?  ?  ?  ?  ?  ?  ?  General Comments: Pt agreeable to participate, pt in bed soaked in urine despite purewick/briefs, I let her know to notify staff if she feels wet/if her skin feels damp because we may not be aware and this can lead to bed sores. ?  ?  ? ?  ?Exercises Other Exercises ?Other Exercises: heel slides, hip abduction x5 reps supine AROM ?Other Exercises: hip bridge x2 reps  supine ? ?  ?General Comments General comments (skin integrity, edema, etc.): 3L O2 [place] needed for sitting EOB, desat to 86% on 2L with transfer so increased to 3L; pt dyspneic to 86% on 3L while pivoting so increased to 4L to remain at 88% and greater; returned to 3L O2 [place] on the wall post-exertion and slowly returned from 87% to 92% after ~2-3 minutes of rest/pursed-lip breathing on 3L. ?  ?  ? ?Pertinent Vitals/Pain Pain Assessment ?Pain Assessment: Faces ?Faces Pain Scale: Hurts a little bit ?Pain Location: generalized fatigue, endorsing having a panic attack earlier ?Pain Descriptors / Indicators: Discomfort, Grimacing ?Pain Intervention(s): Limited activity within patient's tolerance, Monitored during session, Repositioned  ? ? ?   ?   ? ?PT Goals (current goals can now be found in the care plan section) Acute Rehab PT Goals ?Patient Stated Goal: to breathe better and go home ?PT Goal Formulation: With patient ?Time For Goal Achievement: 05/22/21 ?Progress towards PT goals: Progressing toward goals ? ?  ?Frequency ? ? ? Min 3X/week ? ? ? ?  ?PT Plan Current plan remains appropriate  ? ? ?   ?AM-PAC PT "6 Clicks" Mobility   ?Outcome Measure ? Help needed turning from your back to your side while in a flat bed without using bedrails?: A Little ?Help needed moving from lying on your back to sitting on the side of a flat bed without using bedrails?: A Little ?Help needed moving to and from a bed to a chair (including a wheelchair)?: A Lot ?Help needed standing up from a chair using your arms (e.g., wheelchair or bedside chair)?: A Lot ?Help needed to walk in hospital room?: A Lot ?Help needed climbing 3-5 steps with a railing? : Total ?6 Click Score: 13 ? ?  ?End of Session Equipment Utilized During Treatment: Gait belt;Oxygen ?Activity Tolerance: Patient tolerated treatment well;Patient limited by fatigue ?Patient left: in bed;with call bell/phone within reach;with bed alarm set;Other (comment) (NT notified she  could use a bath and that we changed her bed sheets/gown when it was wet.) ?Nurse Communication: Mobility status;Other (comment) (up to 3L upon return to supine) ?PT Visit Diagnosis: Difficulty in walking, not elsewhere classified (R26.2);Muscle weakness (generalized) (M62.81) ?  ? ? ?Time: 0272-5366 ?PT Time Calculation (min) (ACUTE ONLY): 39 min ? ?Charges:  $Therapeutic Exercise: 8-22 mins ?$Therapeutic Activity: 23-37 mins          ?          ? ?Freddrick Gladson P., PTA ?Acute Rehabilitation Services ?Secure Chat Preferred 9a-5:30pm ?Office: [phone]  ? ? ?Kara Pacer Malachi Suderman ?05/09/2021, 6:27 PM ? ?

## 2021-05-09 NOTE — Plan of Care (Signed)
?  Problem: Education: ?Goal: Knowledge of General Education information will improve ?Description: Including pain rating scale, medication(s)/side effects and non-pharmacologic comfort measures ?Outcome: Progressing ?  ?Problem: Health Behavior/Discharge Planning: ?Goal: Ability to manage health-related needs will improve ?Outcome: Progressing ?  ?Problem: Clinical Measurements: ?Goal: Ability to maintain clinical measurements within normal limits will improve ?Outcome: Progressing ?Goal: Will remain free from infection ?Outcome: Progressing ?Goal: Diagnostic test results will improve ?Outcome: Progressing ?Goal: Respiratory complications will improve ?Outcome: Progressing ?Goal: Cardiovascular complication will be avoided ?Outcome: Progressing ?  ?Problem: Activity: ?Goal: Risk for activity intolerance will decrease ?Outcome: Progressing ?  ?Problem: Nutrition: ?Goal: Adequate nutrition will be maintained ?Outcome: Progressing ?  ?Problem: Coping: ?Goal: Level of anxiety will decrease ?Outcome: Progressing ?  ?Problem: Elimination: ?Goal: Will not experience complications related to bowel motility ?Outcome: Progressing ?Goal: Will not experience complications related to urinary retention ?Outcome: Progressing ?  ?Problem: Pain Managment: ?Goal: General experience of comfort will improve ?Outcome: Progressing ?  ?Problem: Safety: ?Goal: Ability to remain free from injury will improve ?Outcome: Progressing ?  ?Problem: Skin Integrity: ?Goal: Risk for impaired skin integrity will decrease ?Outcome: Progressing ?  ?

## 2021-05-09 NOTE — Progress Notes (Signed)
?Progress Note ? ? ?Patient: Jacqueline Harris PPI:[id number] DOB: [birth year] DOA: 05/07/2021     1 ?DOS: the patient was seen and examined on 05/09/2021 ?  ?Brief hospital course: ?Jacqueline Harris was admitted to the hospital with the working diagnosis of decompensated heart failure.  ? ?76 yo female with the past medical history of heart failure, pulmonary hypertension, interstitial lung disease, chronic hypoxemic respiratory failure who presented with dyspnea and lower extremity edema. Reported worsening symptoms for the last 7 days, despite increase dose of diuretic therapy at home.  ?Recent hospitalization 03/19 to 03/22 for decompensated heart failure. Required left thoracentesis, 850 cc of exudate fluid.  ?On his initial physical examination her blood pressure was 135/94, HR 98, RR 21 02 saturation 92% on 2 L per min per [place], lungs with diffuse rales bilaterally, no wheezing, poor inspiratory effort, heart with S1 and S2 present and rhythmic, no gallops, abdomen not distended and positive lower extremity edema ++.  ? ?Na 139, K 3,5 Cl 107, bicarbonate 23, glucose 182, bun 32 cr 1,34 ?BNP 321 ?High sensitive troponin 9 and 13.  ?Lactic acid 2,6 ?Wbc 8.2 hgb 9,6 plt 348  ? ?Chest radiograph with cardiomegaly, bilateral hilar vascular congestion with bilateral interstitial infiltrates, more predominant to the left lower lobe, bilateral pleural effusions more left than right.  ? ?EKG 89 bpm, left axis deviation, normal intervals, sinus rhythm with low voltage, no significant ST segment or T wave changes.  ? ?Patient was placed on furosemide for diuresis.  ? ?Assessment and Plan: ?* Acute on chronic diastolic heart failure ([place]) ?Echocardiogram with preserved LV systolic function with EF 50 to 06%, RV systolic function with mild to moderate reduction, moderate enlarged RV cavity. Severe elevated pulmonary artery pressure. RVSP 69.2 mmHg.  ?Small pericardial effusion, mild to moderate tricuspid regurgitation. ? ?Urine output  2,450 ml over last 24 hrs ?Blood pressure systolic is 301 to 601 mmHg.  ? ?Diuresis with furosemide 60 mg IV q12  ?Patient with poor prognosis due to pulmonary hypertension.  ? ?Patient with poor prognosis ?Code status has been changed to DNR ?She and her daughter have requested palliative care consultation.  ?Considering her rapid decline in her health and poor prognosis, palliative is appropriate.  ? ? ?Pulmonary hypertension ([place]) ?Interstitial lung disease. UIP ?Sever pulmonary hypertension with acute on chronic hypoxemic respiratory failure ?Acute pulmonary edema with bilateral pleural effusions.  ? ?Continue diuresis with IV furosemide to target negative fluid balance. ?On sildenafil, macitetan and bronchodilator therapy.  ? ?Oxymetry is 95% on 4 L/min per [place]. ?Continue close oxymetry monitoring, if rapid worsening increase oxygen requirements despite improving in volume status, will consider systemic steroids in the setting of pulmonary fibrosis.  ?Today clinically no frank evidence of flare up.  ? ?Acute kidney injury superimposed on chronic kidney disease ([place]) ?CKD stage 3B ? ?Renal function with serum cr at 1,38, K is 4,0 and serum bicarbonate at 26. ?Continue diuresis with furosemide.  ? ?Follow up renal function in am, avoid hypotension and nephrotoxic medications.  ? ?Type 2 diabetes mellitus with hyperlipidemia ([place]) ?Continue glucose cover and monitoring with insulin sliding scale.  ?Patient with poor oral intake. ?Consult nutrition for evaluation.  ?Fasting glucose today at 109 mg/dl.  ? ?Anemia ?Anemia of chronic disease with anemia or iron deficiency.  ?Hgb is 9,5 range  ?Continue close follow up of cell count.  ? ? ? ? ?  ? ?Subjective: Patient with mild improvement in dyspnea, no chest pain, continue to  be very weak and deconditioned  ? ?Physical Exam: ?Vitals:  ? 05/09/21 0405 05/09/21 0806 05/09/21 0824 05/09/21 1157  ?BP: (!) 132/58 (!) 153/70  (!) 128/54  ?Pulse: 99 (!) 112  91  ?Resp: (!)  25 (!) 31 20   ?Temp: 97.6 ?F (36.4 ?C) 98.2 ?F (36.8 ?C)  98.3 ?F (36.8 ?C)  ?TempSrc: Oral Oral  Oral  ?SpO2: 90% (!) 86%  95%  ?Weight: 60 kg     ? ?Neurology awake and alert ?ENT with mild pallor ?Cardiovascular with S1 and S2 present and rhythmic with no gallops, rubs or murmurs ?Moderate JVD ?Lower extremity edema ++ pitting warm lower extremities ?Respiratory with bilateral rales but not wheezing ?Abdomen not distended  ?Data Reviewed: ? ? ? ?Family Communication: I spoke with patient's daughter at the bedside, we talked in detail about patient's condition, plan of care and prognosis and all questions were addressed. ? ? ?Disposition: ?Status is: Inpatient ?Remains inpatient appropriate because: heart failure  ? Planned Discharge Destination: Home ? ? ? ?Author: ?Tawni Millers, MD ?05/09/2021 2:44 PM ? ?For on call review [email or web address].  ?

## 2021-05-09 NOTE — Progress Notes (Signed)
? ?Progress Note ? ?Patient Name: Jacqueline Harris ?Date of Encounter: 05/09/2021 ? ?Primary Cardiologist: Rozann Lesches, MD  ? ?Subjective  ? ?Thinks she is doing better at baseline  ? ?Inpatient Medications  ?  ?Scheduled Meds: ? citalopram  20 mg Oral Daily  ? ferrous sulfate  325 mg Oral Q breakfast  ? furosemide  60 mg Intravenous BID  ? insulin aspart  0-5 Units Subcutaneous QHS  ? insulin aspart  0-9 Units Subcutaneous TID WC  ? ipratropium-albuterol  3 mL Nebulization BID  ? macitentan  10 mg Oral Daily  ? multivitamin with minerals  1 tablet Oral Daily  ? pantoprazole  40 mg Oral BID  ? potassium chloride  10 mEq Oral Daily  ? sildenafil  20 mg Oral TID  ? ?Continuous Infusions: ? ?PRN Meds: ?acetaminophen, albuterol, ALPRAZolam, hydrOXYzine, polyethylene glycol, prochlorperazine  ? ?Vital Signs  ?  ?Vitals:  ? 05/08/21 2000 05/09/21 0000 05/09/21 0005 05/09/21 0405  ?BP:   (!) 108/41 (!) 132/58  ?Pulse:   96 99  ?Resp: _0 (!) 25  ?Temp: 98 ?F (36.7 ?C)  97.8 ?F (36.6 ?C) 97.6 ?F (36.4 ?C)  ?TempSrc: Oral  Oral Oral  ?SpO2:  92% 92% 90%  ?Weight:    60 kg  ? ? ?Intake/Output Summary (Last 24 hours) at 05/09/2021 0752 ?Last data filed at 05/09/2021 0601 ?Gross per 24 hour  ?Intake 210 ml  ?Output 2450 ml  ?Net -2240 ml  ? ?Filed Weights  ? 05/08/21 0500 05/09/21 0405  ?Weight: 61.6 kg 60 kg  ? ? ?Telemetry  ?  ?Sinus tachy - Personally Reviewed ? ?ECG  ?  ?none - Personally Reviewed ? ?Physical Exam  ? ?GEN: chronically ill appearing, dyspneic   ?Neck: 6 cm JVD ?Cardiac: Reg tachy, no murmurs, rubs, or gallops.  ?Respiratory: scattered fine pitched rales and coarse rhonchi ?GI: Soft, nontender, non-distended  ?MS: 1+ edema; No deformity. ?Neuro:  Nonfocal  ?Psych: Normal affect  ? ?Labs  ?  ?Chemistry ?Recent Labs  ?Lab 05/07/21 ?1932 05/08/21 ?0137 05/09/21 ?0440  ?NA 139 138 137  ?K 3.5 3.5 4.0  ?CL 107 105 106  ?CO2 _1 ?GLUCOSE 182* 143* 109*  ?BUN 32* 30* 32*  ?CREATININE 1.34* 1.25* 1.38*   ?CALCIUM 8.3* 8.1* 7.8*  ?PROT 5.8* 5.4*  --   ?ALBUMIN 2.7* 2.1*  --   ?AST 13* 15  --   ?ALT 5 10  --   ?ALKPHOS 67 71  --   ?BILITOT 0.4 0.7  --   ?GFRNONAA 42* 46* 40*  ?ANIONGAP _2 ?  ? ?Hematology ?Recent Labs  ?Lab 05/07/21 ?1932 05/08/21 ?0137  ?WBC 8.2 7.2  6.9  ?RBC 4.58 4.37  4.35  ?HGB 9.6* 9.5*  9.2*  ?HCT 33.1* 31.7*  31.7*  ?MCV 72.3* 72.5*  72.9*  ?MCH 21.0* 21.7*  21.1*  ?MCHC 29.0* 30.0  29.0*  ?RDW 26.5* 26.2*  25.9*  ?PLT 348 335  332  ? ? ?Cardiac EnzymesNo results for input(s): TROPONINI in the last 168 hours. No results for input(s): TROPIPOC in the last 168 hours.  ? ?BNP ?Recent Labs  ?Lab 05/07/21 ?1932  ?BNP 321.1*  ?  ? ?DDimer No results for input(s): DDIMER in the last 168 hours.  ? ?Radiology  ?  ?DG Chest Port 1 View ? ?Result Date: 05/07/2021 ?CLINICAL DATA:  Shortness of breath. EXAM: PORTABLE CHEST 1 VIEW COMPARISON:  04/04/2021 chest  radiograph FINDINGS: Cardiomegaly again noted. Pulmonary vascular congestion and mild interstitial opacities are noted. Slightly increasing LEFT pleural effusion/LEFT LOWER lung atelectasis noted. A small RIGHT pleural effusion is noted. No pneumothorax or acute bony abnormality. IMPRESSION: 1. Cardiomegaly with pulmonary vascular congestion, mild interstitial edema and slightly increasing LEFT pleural effusion/LEFT LOWER lung atelectasis. 2. Small RIGHT pleural effusion. Electronically Signed   By: Margarette Canada M.D.   On: 05/07/2021 18:17   ? ?Cardiac Studies  ? ?Echo pending ? ?Patient Profile  ?   ?76 y.o. female admitted with respiratory failure, known pulm HTN, and ILD. She is 10 lbs over her dry weight from DC in 3/23. ? ?Assessment & Plan  ?  ?Acute on chronic diastolic heart failure - improved with iv diuresis K 4.0 Cr 1.38 BUN 32 keep on dry side with lung dx ?BNP only 321 Change to lower dose oral lasix in am  ?Pulm HTN - continue medical therapy with Revatio. Her prognosis is guarded ?Hypoxemia - I suspect she is off of her  baseline. Continue supplemental oxygen. Check an ESR. I am always concerned about exacerbation of ILD.  ?Sinus tachycardia - a concerning sign. Given age and underlying lung dx.  ? ?Note primary service should address code status. When I asked her she indicated she did not want to be intubated in future  ?   ? ?For questions or updates, please contact [place] ?Please consult [email or web address] for contact info under Cardiology/STEMI. ?  ?   ?Signed, ?Jenkins Rouge, MD  ?05/09/2021, 7:52 AM    ?

## 2021-05-09 NOTE — Progress Notes (Signed)
Initial Nutrition Assessment ? ?DOCUMENTATION CODES:  ? ?Non-severe (moderate) malnutrition in context of chronic illness ? ?INTERVENTION:  ? ?Agree with liberalized diet to Regular ? ?Ensure Enlive po BID, each supplement provides 350 kcal and 20 grams of protein. ? ?MVI with Minerals ? ?NUTRITION DIAGNOSIS:  ? ?Moderate Malnutrition related to chronic illness as evidenced by moderate fat depletion, moderate muscle depletion. ? ?GOAL:  ? ?Patient will meet greater than or equal to 90% of their needs ? ?MONITOR:  ? ?PO intake, Supplement acceptance, Weight trends, Labs ? ?REASON FOR ASSESSMENT:  ? ?Consult, Malnutrition Screening Tool ?Assessment of nutrition requirement/status ? ?ASSESSMENT:  ? ?76 yo female admitted with acute on chronic CHF, pulmonary HTN, AKI on CKD 3, FTT, physical debility. PMH includes preDM, CHF, HTN, GAVE, gastric AVMs, iron def anemia ? ?Palliative Care has been consulted  ? ?Recorded po intake 15% at breakfast. Pt ate some of her side salad and some potato soup at lunch today. Pt reports appetite has been good at home but food here does not taste good. Currently on Heart Healthy/Carb Modified diet. Pt lives home alone but Daughter has been staying with pt during the day and has been preparing patient's meals. Pt eating generally well but does drink protein shake when appetite down.  ?Pt does report taste buds have been altered every since having COVID19 ? ?Pt feels very weak, falls occasionally at home. Recently pt has mostly been chair/bed bound. Family assists pt to bathroom and back but that is about the only mobility.  ? ?Unsure if pt has lost any weight as pt with weight gain of 10 pounds from fluid PTA.  ? ?Labs: reviewed; HgbA1c 5.7 ?Meds: lasix, ferrous sulfate, ss novolog, MVI with Minerals, KCl ? ?NUTRITION - FOCUSED PHYSICAL EXAM: ? ?Flowsheet Row Most Recent Value  ?Orbital Region Moderate depletion  ?Upper Arm Region Mild depletion  ?Thoracic and Lumbar Region Moderate  depletion  ?Buccal Region Moderate depletion  ?Temple Region Severe depletion  ?Clavicle Bone Region Severe depletion  ?Clavicle and Acromion Bone Region Moderate depletion  ?Scapular Bone Region Moderate depletion  ?Dorsal Hand Moderate depletion  ?Patellar Region Severe depletion  ?Anterior Thigh Region Severe depletion  ?Posterior Calf Region Severe depletion  ?Edema (RD Assessment) Mild  ?Hair --  Grier Rocher pluckable]  ? ?  ? ? ?Diet Order:   ?Diet Order   ? ?       ?  Diet regular Room service appropriate? Yes; Fluid consistency: Thin  Diet effective now       ?  ? ?  ?  ? ?  ? ? ?EDUCATION NEEDS:  ? ?Education needs have been addressed ? ?Skin:  Skin Assessment: Reviewed RN Assessment ? ?Last BM:  4/22 ? ?Height:  ? ?Ht Readings from Last 1 Encounters:  ?04/11/21 _0  (1.626 m)  ? ? ?Weight:  ? ?Wt Readings from Last 1 Encounters:  ?05/09/21 60 kg  ? ? ? ?BMI:  Body mass index is 22.71 kg/m?. ? ?Estimated Nutritional Needs:  ? ?Kcal:  1600-1750 kcals ? ?Protein:  75-85 g ? ?Fluid:  >/= 1.5 L ? ? ?Kerman Passey MS, RDN, LDN, CNSC ?Registered Dietitian III ?Clinical Nutrition ?RD Pager and On-Call Pager Number Located in [place]  ? ?

## 2021-05-09 NOTE — Progress Notes (Signed)
Pharmacy Consult for Pulmonary Hypertension Treatment  ? ?Indication - Continuation of prior to admission medication  ? ?Patient is 76 y.o.  with history of PAH on chronic Macitentan (Opsumit) PTA and will be continued while hospitalized.  ? ?Continuing this medication order as an inpatient requires that monitoring parameters per REMS requirements must be met. ? ?Chronic therapy is under the supervision of Dr. Haroldine Laws who is enrolled in the REMS program and is being notified of continuation of therapy. A staff message in EPIC has been sent notifying the certified prescriber.  ?Patient has previously been educated on Pregnancy Risk and Hepatotoxicity . On admission pregnancy risk has been assessed and no monitoring required. ?Hepatic function has been evaluated. AST/ALT appropriate to continue medication at this time. ? ? ?  Latest Ref Rng & Units 05/08/2021  ?  1:37 AM 05/07/2021  ?  7:32 PM 04/04/2021  ?  8:40 AM  ?Hepatic Function  ?Total Protein 6.5 - 8.1 g/dL 5.4   5.8   5.6    ?Albumin 3.5 - 5.0 g/dL 2.1   2.7   2.3    ?AST 15 - 41 U/L _0 ?ALT 0 - 44 U/L _1 ?Alk Phosphatase 38 - 126 U/L 71   67   51    ?Total Bilirubin 0.3 - 1.2 mg/dL 0.7   0.4   0.7    ? ? ?If any question arise or pregnancy is identified during hospitalization, contact for bosentan and macitentan: [phone]; ambrisentan: [phone]. ? ?Thank for you allowing [place] to participate in the care of this patient. ? ?Hildred Laser, PharmD ?Clinical Pharmacist ?**Pharmacist phone directory can now be found on [email or web address] ([email or web address]).  Listed under [place]. ? ? ?

## 2021-05-09 NOTE — Evaluation (Signed)
Occupational Therapy Evaluation ?Patient Details ?Name: Jacqueline Harris ?MRN: [id number] ?DOB: 1945/06/18 ?Today's Date: 05/09/2021 ? ? ?History of Present Illness Patient is a 76 y/o female who presents on 4/22 with SOB and LE swelling. Admitted with acute on chronic diastolic CHF. Last admission 3/23 for respiratory failure with hypoxia secondary to CHF. PMH includes HTn, GAVE syndrome, CHF.  ? ?Clinical Impression ?  ?Prior to this admission, patient was living alone with granddaughter and daughter assisting with ADLs, laundry, and general housework for patient. Patient admits that she has needed more assist since last admission in March of 23, and unsure how she would manage independently (daughter is actively looking for a job). Currently, patient with increased need for O2 sitting EOB (up to 4L to maintain above 90% satting at 83-85% on 3L). Patient with panic attack earlier in the AM (confirmed by RN) therefore with increased fatigue and willing to sit EOB. Patient also mod assist for ADLs. Recommending HHOT since patient has good support, however may need a stint at short term SNF if patient continues to demonstrate increased weakness and fatigue.  ? ?   ? ?Recommendations for follow up therapy are one component of a multi-disciplinary discharge planning process, led by the attending physician.  Recommendations may be updated based on patient status, additional functional criteria and insurance authorization.  ? ?Follow Up Recommendations ? Home health OT  ?  ?Assistance Recommended at Discharge Intermittent Supervision/Assistance  ?Patient can return home with the following A lot of help with bathing/dressing/bathroom;A lot of help with walking and/or transfers;Assistance with cooking/housework;Assist for transportation;Help with stairs or ramp for entrance ? ?  ?Functional Status Assessment ? Patient has had a recent decline in their functional status and demonstrates the ability to make significant  improvements in function in a reasonable and predictable amount of time.  ?Equipment Recommendations ? None recommended by OT  ?  ?Recommendations for Other Services   ? ? ?  ?Precautions / Restrictions Precautions ?Precautions: Fall;Other (comment) ?Precaution Comments: watch 02 ?Restrictions ?Weight Bearing Restrictions: No  ? ?  ? ?Mobility Bed Mobility ?Overal bed mobility: Needs Assistance ?Bed Mobility: Supine to Sit, Sit to Supine ?  ?  ?Supine to sit: Mod assist, HOB elevated ?Sit to supine: Supervision, HOB elevated ?  ?General bed mobility comments: Assist with trunk and hips to get to EOB. ?  ? ?Transfers ?  ?  ?  ?  ?  ?  ?  ?  ?  ?General transfer comment: politely declining due to panic attack earlier and significant fatigue ?  ? ?  ?Balance Overall balance assessment: Needs assistance ?Sitting-balance support: Feet supported, Bilateral upper extremity supported ?Sitting balance-Leahy Scale: Poor ?  ?  ?  ?  ?  ?  ?  ?  ?  ?  ?  ?  ?  ?  ?  ?  ?   ? ?ADL either performed or assessed with clinical judgement  ? ?ADL Overall ADL's : Needs assistance/impaired ?Eating/Feeding: Set up;Sitting ?  ?Grooming: Set up;Sitting ?  ?Upper Body Bathing: Minimal assistance;Sitting ?  ?Lower Body Bathing: Moderate assistance;Sitting/lateral leans ?  ?Upper Body Dressing : Minimal assistance;Sitting ?  ?Lower Body Dressing: Moderate assistance;Maximal assistance;Sitting/lateral leans;Sit to/from stand ?  ?Toilet Transfer: Moderate assistance;Stand-pivot;Cueing for safety;Cueing for sequencing;BSC/3in1 ?Toilet Transfer Details (indicate cue type and reason): declining OOB due to fatigue ?  ?  ?  ?  ?Functional mobility during ADLs: Moderate assistance;Cueing for safety;Cueing for sequencing ?General ADL Comments:  Patient presenting with decreased activity tolerancee, fatigue, and need for increase to 4L sitting EOB to maintain sats above 90%  ? ? ? ?Vision Baseline Vision/History: 1 Wears glasses (Readers) ?Ability to  See in Adequate Light: 0 Adequate ?Patient Visual Report: No change from baseline ?   ?   ?Perception   ?  ?Praxis   ?  ? ?Pertinent Vitals/Pain Pain Assessment ?Pain Assessment: Faces ?Faces Pain Scale: Hurts a little bit ?Pain Location: generalized fatigue, endorsing having a panic attack earlier ?Pain Descriptors / Indicators: Discomfort, Grimacing ?Pain Intervention(s): Limited activity within patient's tolerance, Monitored during session, Repositioned  ? ? ? ?Hand Dominance Right ?  ?Extremity/Trunk Assessment Upper Extremity Assessment ?Upper Extremity Assessment: Generalized weakness ?  ?Lower Extremity Assessment ?Lower Extremity Assessment: Defer to PT evaluation ?  ?Cervical / Trunk Assessment ?Cervical / Trunk Assessment: Normal ?  ?Communication Communication ?Communication: No difficulties ?  ?Cognition Arousal/Alertness: Lethargic ?Behavior During Therapy: Flat affect ?Overall Cognitive Status: Within Functional Limits for tasks assessed ?  ?  ?  ?  ?  ?  ?  ?  ?  ?  ?  ?  ?  ?  ?  ?  ?General Comments: Reports fatigue from panic attack earlier (RN confirming) but remains motivated to participate ?  ?  ?General Comments  Need for increased to 4L in session as sats staying between 83-85% when seated EOB ? ?  ?Exercises   ?  ?Shoulder Instructions    ? ? ?Home Living Family/patient expects to be discharged to:: Private residence ?Living Arrangements: Alone;Other relatives (granddaughter and daughter in and out) ?Available Help at Discharge: Family;Available PRN/intermittently ?Type of Home: House ?Home Access: Stairs to enter ?Entrance Stairs-Number of Steps: 13- stair lift ?Entrance Stairs-Rails: Right ?Home Layout: Two level;Laundry or work area in basement ?Alternate Level Stairs-Number of Steps: 14 ?Alternate Level Stairs-Rails: Right ?Bathroom Shower/Tub: Gaffer;Tub/shower unit ?  ?Bathroom Toilet: Standard ?Bathroom Accessibility: Yes ?  ?Home Equipment: Conservation officer, nature (2 wheels);Shower  seat;BSC/3in1;Cane - single point ?  ?Additional Comments: 2 falls recently ?  ? ?  ?Prior Functioning/Environment Prior Level of Function : Needs assist ?  ?  ?  ?  ?  ?  ?Mobility Comments: Wears 02 baseline. Since admission in March, pt is a limited ambulator due to fatigue/LE weakness. Daughter helps with dressing/bathing. ?ADLs Comments: Daughter/granddaughter helps with groceries and cleaning ?  ? ?  ?  ?OT Problem List: Decreased strength;Decreased range of motion;Decreased activity tolerance;Impaired balance (sitting and/or standing);Decreased knowledge of use of DME or AE;Decreased safety awareness;Cardiopulmonary status limiting activity;Impaired sensation;Pain;Increased edema ?  ?   ?OT Treatment/Interventions: Self-care/ADL training;Therapeutic exercise;Energy conservation;DME and/or AE instruction;Therapeutic activities;Patient/family education;Balance training  ?  ?OT Goals(Current goals can be found in the care plan section) Acute Rehab OT Goals ?Patient Stated Goal: to get to feeling better ?OT Goal Formulation: With patient ?Time For Goal Achievement: 05/23/21 ?Potential to Achieve Goals: Fair ?ADL Goals ?Pt Will Perform Lower Body Bathing: with min guard assist;sitting/lateral leans;with adaptive equipment;sit to/from stand ?Pt Will Perform Lower Body Dressing: with min guard assist;with adaptive equipment;sitting/lateral leans;sit to/from stand ?Pt Will Transfer to Toilet: with min assist;ambulating ?Additional ADL Goal #1: Patient will demonstrate increased activty tolerance to engage in functional task in standing for 1-3 minutes without need for seated rest break. ?Additional ADL Goal #2: Patient will verbalize 3 strategies for energy conservation for safe discharge home.  ?OT Frequency: Min 2X/week ?  ? ?Co-evaluation   ?  ?  ?  ?  ? ?  ?  AM-PAC OT "6 Clicks" Daily Activity     ?Outcome Measure Help from another person eating meals?: A Little ?Help from another person taking care of personal  grooming?: A Little ?Help from another person toileting, which includes using toliet, bedpan, or urinal?: A Lot ?Help from another person bathing (including washing, rinsing, drying)?: A Lot ?Help from another person

## 2021-05-09 NOTE — TOC Initial Note (Signed)
Transition of Care (TOC) - Initial/Assessment Note  ? ? ?Patient Details  ?Name: Jacqueline Harris ?MRN: [id number] ?Date of Birth: 1945/07/04 ? ?Transition of Care (TOC) CM/SW Contact:    ?Graves-Bigelow, Ocie Cornfield, RN ?Phone Number: ?05/09/2021, 1:52 PM ? ?Clinical Narrative: Case Manager spoke with patient regarding home health services and disposition needs. PTA patient was from home alone in a single family home. Patient sates daughter checks in on her often.Patient has DME cane and rollator in the home and oxygen via [employer]. Patient is agreeable to home health services via [place]. Case Manager made the referral and start of care to begin within 24-48 hours of discharge. Daughter will aide in transportation home via private vehicle.                ? ? ?Expected Discharge Plan: [place] ?Barriers to Discharge: Continued Medical Work up ? ? ?Patient Goals and CMS Choice ?Patient states their goals for this hospitalization and ongoing recovery are:: to return home. ?  ?  ? ?Expected Discharge Plan and Services ?Expected Discharge Plan: [place] ?In-house Referral: NA ?Discharge Planning Services: CM Consult ?Post Acute Care Choice: Home Health ?  ?                ?  ?DME Agency: NA ?  ?  ?  ?HH Arranged: PT, OT ?[place] Agency: [facility] ?Date HH Agency Contacted: 05/09/21 ?Time [place]: [id number] ?Representative spoke with at [place]: Levada Dy ? ?Prior Living Arrangements/Services ?  ?Lives with:: Self (daughter checks in on the patient.) ?Patient language and need for interpreter reviewed:: Yes ?Do you feel safe going back to the place where you live?: Yes      ?Need for Family Participation in Patient Care: Yes (Comment) ?Care giver support system in place?: Yes (comment) ?  ?Criminal Activity/Legal Involvement Pertinent to Current Situation/Hospitalization: No - Comment as needed ? ?Activities of Daily Living ?Home Assistive Devices/Equipment: None ?ADL  Screening (condition at time of admission) ?Patient's cognitive ability adequate to safely complete daily activities?: Yes ?Is the patient deaf or have difficulty hearing?: No ?Does the patient have difficulty seeing, even when wearing glasses/contacts?: No ?Does the patient have difficulty concentrating, remembering, or making decisions?: No ?Patient able to express need for assistance with ADLs?: Yes ?Does the patient have difficulty dressing or bathing?: No ?Independently performs ADLs?: Yes (appropriate for developmental age) ?Does the patient have difficulty walking or climbing stairs?: Yes ?Weakness of Legs: Both ?Weakness of Arms/Hands: Both ? ?Permission Sought/Granted ?Permission sought to share information with : Family Supports, Customer service manager, Case Manager ?Permission granted to share information with : Yes, Verbal Permission Granted ?   ? Permission granted to share info w AGENCY: [facility] ?   ?   ? ?Emotional Assessment ?Appearance:: Appears stated age ?Attitude/Demeanor/Rapport: Engaged ?Affect (typically observed): Appropriate ?Orientation: : Oriented to Situation, Oriented to  Time, Oriented to Place, Oriented to Self ?Alcohol / Substance Use: Not Applicable ?Psych Involvement: No (comment) ? ?Admission diagnosis:  Acute on chronic diastolic heart failure ([place]) [I50.33] ?Acute on chronic congestive heart failure, unspecified heart failure type ([place]) [I50.9] ?Patient Active Problem List  ? Diagnosis Date Noted  ? Pulmonary hypertension ([place])   ? Type 2 diabetes mellitus with hyperlipidemia ([place])   ? Acute kidney injury superimposed on chronic kidney disease ([place])   ? Anemia   ? Acute on chronic diastolic heart failure ([place]) 05/07/2021  ? Wheezing 04/05/2021  ?  Acute diastolic CHF (congestive heart failure) ([place]) 04/04/2021  ? Bilateral pleural effusion - L >> R 04/04/2021  ? Hilar adenopathy 04/04/2021  ? AKI (acute kidney injury) ([place]) 04/04/2021  ? ILD (interstitial lung disease)  ([place]) 02/24/2021  ? Pulmonary nodule 02/24/2021  ? Acute esophagitis   ? GAVE (gastric antral vascular ectasia)   ? Dysphagia   ? Primary pulmonary hypertension ([place]) 04/29/2020  ? Malnutrition of moderate degree 11/25/2019  ? Coronary artery calcification   ? RVF (right ventricular failure) ([place])   ? Pericardial effusion 11/21/2019  ? Iron deficiency anemia 11/21/2019  ? Prediabetes   ? Acute respiratory failure with hypoxia ([place])   ? ?PCP:  Glenda Chroman, MD ?Pharmacy:   ?[id number] [place] [place] - [address]?Phone: [phone]: [phone]? ?[facility] [address]?Phone: [phone]: [phone]? ? ?Readmission Risk Interventions ?   ? View : No data to display.  ?  ?  ?  ? ? ? ?

## 2021-05-09 NOTE — Assessment & Plan Note (Signed)
Anxiety ?Plan to continue with alprazolam and citalopram.  ?

## 2021-05-10 DIAGNOSIS — I509 Heart failure, unspecified: Secondary | ICD-10-CM | POA: Diagnosis not present

## 2021-05-10 DIAGNOSIS — E1169 Type 2 diabetes mellitus with other specified complication: Secondary | ICD-10-CM | POA: Diagnosis not present

## 2021-05-10 DIAGNOSIS — I272 Pulmonary hypertension, unspecified: Secondary | ICD-10-CM | POA: Diagnosis not present

## 2021-05-10 DIAGNOSIS — Z515 Encounter for palliative care: Secondary | ICD-10-CM

## 2021-05-10 DIAGNOSIS — Z66 Do not resuscitate: Secondary | ICD-10-CM

## 2021-05-10 DIAGNOSIS — N179 Acute kidney failure, unspecified: Secondary | ICD-10-CM | POA: Diagnosis not present

## 2021-05-10 DIAGNOSIS — I5033 Acute on chronic diastolic (congestive) heart failure: Secondary | ICD-10-CM | POA: Diagnosis not present

## 2021-05-10 DIAGNOSIS — D649 Anemia, unspecified: Secondary | ICD-10-CM | POA: Diagnosis not present

## 2021-05-10 DIAGNOSIS — F32A Depression, unspecified: Secondary | ICD-10-CM

## 2021-05-10 LAB — BASIC METABOLIC PANEL
Anion gap: 7 (ref 5–15)
BUN: 32 mg/dL — ABNORMAL HIGH (ref 8–23)
CO2: 26 mmol/L (ref 22–32)
Calcium: 7.9 mg/dL — ABNORMAL LOW (ref 8.9–10.3)
Chloride: 104 mmol/L (ref 98–111)
Creatinine, Ser: 1.21 mg/dL — ABNORMAL HIGH (ref 0.44–1.00)
GFR, Estimated: 47 mL/min — ABNORMAL LOW (ref >60)
Glucose, Bld: 107 mg/dL — ABNORMAL HIGH (ref 70–99)
Potassium: 3.8 mmol/L (ref 3.5–5.1)
Sodium: 137 mmol/L (ref 135–145)

## 2021-05-10 MED ORDER — IPRATROPIUM-ALBUTEROL 0.5-2.5 (3) MG/3ML IN SOLN
3.0000 mL | Freq: Four times a day (QID) | RESPIRATORY_TRACT | Status: DC
Start: 2021-05-11 — End: 2021-05-11
  Administered 2021-05-11 (×2): 3 mL via RESPIRATORY_TRACT
  Filled 2021-05-10 (×2): qty 3

## 2021-05-10 MED ORDER — FUROSEMIDE 40 MG PO TABS
40.0000 mg | ORAL_TABLET | Freq: Every day | ORAL | Status: DC
  Administered 2021-05-10 – 2021-05-12 (×3): 40 mg via ORAL
  Filled 2021-05-10 (×4): qty 1

## 2021-05-10 MED ORDER — POTASSIUM CHLORIDE CRYS ER 20 MEQ PO TBCR
40.0000 meq | EXTENDED_RELEASE_TABLET | Freq: Once | ORAL | Status: AC
  Administered 2021-05-10: 40 meq via ORAL
  Filled 2021-05-10: qty 2

## 2021-05-10 MED ORDER — FUROSEMIDE 10 MG/ML IJ SOLN
60.0000 mg | Freq: Once | INTRAMUSCULAR | Status: AC
  Administered 2021-05-10: 60 mg via INTRAVENOUS
  Filled 2021-05-10: qty 6

## 2021-05-10 MED ORDER — IPRATROPIUM-ALBUTEROL 0.5-2.5 (3) MG/3ML IN SOLN
3.0000 mL | Freq: Four times a day (QID) | RESPIRATORY_TRACT | Status: DC | PRN
  Administered 2021-05-10: 3 mL via RESPIRATORY_TRACT
  Filled 2021-05-10: qty 3

## 2021-05-10 NOTE — Progress Notes (Signed)
? ?Progress Note ? ?Patient Name: Jacqueline Harris ?Date of Encounter: 05/10/2021 ? ?Primary Cardiologist: Rozann Lesches, MD  ? ?Subjective  ? ?Dyspnea at baseline  ? ?Inpatient Medications  ?  ?Scheduled Meds: ? citalopram  20 mg Oral Daily  ? feeding supplement  237 mL Oral BID BM  ? ferrous sulfate  325 mg Oral Q breakfast  ? furosemide  60 mg Intravenous BID  ? macitentan  10 mg Oral Daily  ? multivitamin with minerals  1 tablet Oral Daily  ? pantoprazole  40 mg Oral BID  ? potassium chloride  10 mEq Oral Daily  ? sildenafil  20 mg Oral TID  ? tobramycin   Left Eye QID  ? ?Continuous Infusions: ? ?PRN Meds: ?acetaminophen, albuterol, ALPRAZolam, hydrOXYzine, polyethylene glycol, prochlorperazine  ? ?Vital Signs  ?  ?Vitals:  ? 05/09/21 2000 05/09/21 2349 05/10/21 0435 05/10/21 7944  ?BP:  (!) 125/40 (!) 136/53 (!) 110/46  ?Pulse:  95 91 91  ?Resp: 20 20 (!) 24 (!) 27  ?Temp: 98.7 ?F (37.1 ?C) 98.5 ?F (36.9 ?C) 98 ?F (36.7 ?C) 98.5 ?F (36.9 ?C)  ?TempSrc: Oral Oral Oral Oral  ?SpO2:  94% 95% 95%  ?Weight:   57.3 kg   ? ? ?Intake/Output Summary (Last 24 hours) at 05/10/2021 0845 ?Last data filed at 05/10/2021 0530 ?Gross per 24 hour  ?Intake 60 ml  ?Output 2550 ml  ?Net -2490 ml  ? ?Filed Weights  ? 05/08/21 0500 05/09/21 0405 05/10/21 0435  ?Weight: 61.6 kg 60 kg 57.3 kg  ? ? ?Telemetry  ?  ?Sinus tachy - Personally Reviewed ? ?ECG  ?  ?none - Personally Reviewed ? ?Physical Exam  ? ?GEN: chronically ill appearing, dyspneic   ?Neck: 6 cm JVD ?Cardiac: Reg tachy, no murmurs, rubs, or gallops.  ?Respiratory: scattered fine pitched rales and coarse rhonchi ?GI: Soft, nontender, non-distended  ?MS: 1+ edema; No deformity. ?Neuro:  Nonfocal  ?Psych: Normal affect  ? ?Labs  ?  ?Chemistry ?Recent Labs  ?Lab 05/07/21 ?1932 05/08/21 ?0137 05/09/21 ?4619 05/10/21 ?0122  ?NA 139 138 137 137  ?K 3.5 3.5 4.0 3.8  ?CL 107 105 106 104  ?CO2 _0 ?GLUCOSE 182* 143* 109* 107*  ?BUN 32* 30* 32* 32*  ?CREATININE 1.34* 1.25*  1.38* 1.21*  ?CALCIUM 8.3* 8.1* 7.8* 7.9*  ?PROT 5.8* 5.4*  --   --   ?ALBUMIN 2.7* 2.1*  --   --   ?AST 13* 15  --   --   ?ALT 5 10  --   --   ?ALKPHOS 67 71  --   --   ?BILITOT 0.4 0.7  --   --   ?GFRNONAA 42* 46* 40* 47*  ?ANIONGAP _1 ?  ? ?Hematology ?Recent Labs  ?Lab 05/07/21 ?1932 05/08/21 ?0137  ?WBC 8.2 7.2  6.9  ?RBC 4.58 4.37  4.35  ?HGB 9.6* 9.5*  9.2*  ?HCT 33.1* 31.7*  31.7*  ?MCV 72.3* 72.5*  72.9*  ?MCH 21.0* 21.7*  21.1*  ?MCHC 29.0* 30.0  29.0*  ?RDW 26.5* 26.2*  25.9*  ?PLT 348 335  332  ? ? ?Cardiac EnzymesNo results for input(s): TROPONINI in the last 168 hours. No results for input(s): TROPIPOC in the last 168 hours.  ? ?BNP ?Recent Labs  ?Lab 05/07/21 ?1932  ?BNP 321.1*  ?  ? ?DDimer No results for input(s): DDIMER in the last 168 hours.  ? ?Radiology  ?  ?  No results found. ? ?Cardiac Studies  ? ?Echo  EF 50-55% mildly reduced RV small pericardial effusion elevated PA pressure Late bubble crossing suggesting pulmonary shunt  ? ?Patient Profile  ?   ?76 y.o. female admitted with respiratory failure, known pulm HTN, and ILD.   ? ?Assessment & Plan  ?  ?Acute on chronic diastolic heart failure - improved with iv diuresis K 3.8 Cr 1.21 change to oral diuretic keep on dry side with lung dx BNP only 321   ?Pulm HTN - continue medical therapy with Revatio.and Opsumit  Her prognosis is guarded ?Hypoxemia - I suspect she is off of her baseline. Continue supplemental oxygen.  ILD per pulmonary  ?Sinus tachycardia - a concerning sign. Given age and underlying lung dx.  ? ?Code status changed to DNR  ?   ? ?For questions or updates, please contact [place] ?Please consult [email or web address] for contact info under Cardiology/STEMI. ?  ?   ?Signed, ?Jenkins Rouge, MD  ?05/10/2021, 8:45 AM    ?

## 2021-05-10 NOTE — Consult Note (Signed)
? ?                                                                                ?Consultation Note ?Date: 05/10/2021  ? ?Patient Name: Jacqueline Harris  ?DOB: August 05, 1945  MRN: [id number]  Age / Sex: 76 y.o., female  ?PCP: Glenda Chroman, MD ?Referring Physician: Tawni Millers,* ? ?Reason for Consultation: Establishing goals of care ? ?HPI/Patient Profile: 76 y.o. female  with past medical history of acute on chronic HF, pulmonary HTN, interstitial lung disease, chronic hypoxemic respiratory failure, CKD (stage IIIb), type 2 diabetes, anemia, and Raynaud's admitted on 05/07/2021 with dyspnea and lower extremity edema now that worsened over the course of 7 days. ? ?Palliative medicine team was consulted at request of patient and patient's daughter to discuss goals of care.  ? ?Clinical Assessment and Goals of Care: ?I have reviewed medical records including EPIC notes, labs and imaging, assessed the patient and then met with patient and her daughter Jacqueline Harris to discuss diagnosis prognosis, [place], EOL wishes, disposition and options. ? ?I introduced Palliative Medicine as specialized medical care for people living with serious illness. It focuses on providing relief from the symptoms and stress of a serious illness. The goal is to improve quality of life for both the patient and the family. ? ?We discussed a brief life review of the patient.  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  Patient has a son and a daughter.  She has 5 grandchildren, 4 granddaughters and 1 grandson.  When asked what she likes to do for fine and what brings her joy, she gets a big smile on her face and shares that she likes to spend time with her family. ? ?As far as nutritional status PTA patient has been eating and drinking well.  Over the last 3 weeks she has enjoyed eating and drinking the food she likes the most.  These include drinking orange crush sodas and eating chicken salad from a chicken salad  check. ? ?As far as functional status PTA patient has noticed a significant decline.  She lives independently/alone and she shares it takes a lot of energy for her to just get up out of bed to go to the bathroom or perform daily cares.  A little over a month ago she was driving, staying active with outings with family members, and and able to ambulate/move without significant dyspnea or difficulty. ? ?We discussed patient's current illness and what it means in the larger context of patient's on-going co-morbidities. I attempted to elicit values and goals of care important to the patient.  Patient says that her family and spending time with them is important to her.  She also shares she does not like being in the hospital and actually did not even want to come during this hospitalization.  Patient says she knows she is dying and she is okay with that since she knows where she is going.  She shares she wants to have a better quality of life and get better but since she cannot get better she is not exactly sure what kind of quality of life she can get at this  point. ? ?The difference between aggressive medical intervention and comfort care was considered in light of the patient's goals of care. Hospice and Palliative Care services outpatient were explained and offered.  Disposition options of going home with hospice, home with palliative services and PT OT in the home, and continuing with aggressive measures also discussed and reviewed in detail. ? ?Advance directives, concepts specific to code status, and rehospitalization were considered and discussed.  Patient's daughter shares that they have not put a healthcare power of attorney in place because she and her brother are in agreement to do what is in her mother's best interest. ? ?Patient shares she knows her CODE STATUS is DNR but is not exactly clear on what that means.  Clarified that DNR means allowing for natural death, not intervening with a CODE BLUE, CPR,  defibrillation shocks to heart should a cardiopulmonary arrest occur.  Patient was in agreement that she would never want to have CPR or life support.  DNR remains. ? ?Education offered regarding concept specific to human mortality and the limitations of medical interventions to prolong life with a progressive and irreversible heart condition. ? ?Discussed with patient/family the importance of continued conversation with family and the medical providers regarding overall plan of care and treatment options, ensuring decisions are within the context of the patient?s values and GOCs.   ? ?Patient shared she is not ready to make a decision at this time but appreciates all the information from our discussion.  She shared this was a lot of information to take and in process.  I briefly touched on the concept of a MOST form and how it can help put boundaries and parameters in place for future scope of treatment.  GIven patient seemed worn out at conclusion of our discussion, I shared that I plan to round on the patient tomorrow and will bring a copy of a MOST form for discussion and patient's review then. ? ?Questions and concerns were addressed. The family was encouraged to call with questions or concerns.  ? ?Primary Decision Maker ?PATIENT ? ?Code Status/Advance Care Planning: ?DNR ? ?Prognosis:   ?< 6 months ? ?Discharge Planning: To Be Determined ? ?Primary Diagnoses: ?Present on Admission: ? Acute on chronic diastolic heart failure ([place]) ? Pulmonary hypertension ([place]) ? Type 2 diabetes mellitus with hyperlipidemia ([place]) ? Acute kidney injury superimposed on chronic kidney disease ([place]) ? Anemia ? Depression ? ? ?Physical Exam ?Vitals reviewed.  ?Constitutional:   ?   General: She is not in acute distress. ?   Appearance: She is ill-appearing.  ?HENT:  ?   Head: Normocephalic and atraumatic.  ?Eyes:  ?   Pupils: Pupils are equal, round, and reactive to light.  ?Cardiovascular:  ?   Rate and Rhythm: Tachycardia  present.  ?Pulmonary:  ?   Effort: Pulmonary effort is normal.  ?   Breath sounds: Examination of the right-middle field reveals rhonchi. Examination of the left-middle field reveals rhonchi. Examination of the right-lower field reveals decreased breath sounds. Examination of the left-lower field reveals decreased breath sounds. Decreased breath sounds and rhonchi present.  ?Chest:  ?   Chest wall: No mass or tenderness.  ?Abdominal:  ?   Palpations: Abdomen is soft.  ?Musculoskeletal:  ?   Comments: Generalized weakness  ?Skin: ?   General: Skin is warm and dry.  ?Neurological:  ?   Mental Status: She is alert and oriented to person, place, and time.  ?Psychiatric:     ?  Mood and Affect: Mood normal. Mood is not anxious.     ?   Behavior: Behavior normal. Behavior is not agitated.  ? ? ?Palliative Assessment/Data: 50% ? ? ? ? ?I discussed this patient's plan of care with patient, patient's daughter. ? ?Thank you for this consult. Palliative medicine will continue to follow and assist holistically.  ? ?Time Total: 75 minutes ?Greater than 50%  of this time was spent counseling and coordinating care related to the above assessment and plan. ? ?Signed by: ?Jordan Hawks, DNP, FNP-BC ?Palliative Medicine ? ?  ?Please contact Palliative Medicine Team phone at [phone] for questions and concerns.  ?For individual provider: See Amion ? ? ? ? ? ? ? ? ? ? ? ? ?  ?

## 2021-05-10 NOTE — Plan of Care (Signed)
?  Problem: Education: ?Goal: Knowledge of General Education information will improve ?Description: Including pain rating scale, medication(s)/side effects and non-pharmacologic comfort measures ?Outcome: Progressing ?  ?Problem: Health Behavior/Discharge Planning: ?Goal: Ability to manage health-related needs will improve ?Outcome: Progressing ?  ?Problem: Clinical Measurements: ?Goal: Ability to maintain clinical measurements within normal limits will improve ?Outcome: Progressing ?Goal: Will remain free from infection ?Outcome: Progressing ?Goal: Diagnostic test results will improve ?Outcome: Progressing ?Goal: Respiratory complications will improve ?Outcome: Progressing ?Goal: Cardiovascular complication will be avoided ?Outcome: Progressing ?  ?Problem: Activity: ?Goal: Risk for activity intolerance will decrease ?Outcome: Progressing ?  ?Problem: Nutrition: ?Goal: Adequate nutrition will be maintained ?Outcome: Progressing ?  ?Problem: Coping: ?Goal: Level of anxiety will decrease ?Outcome: Progressing ?  ?Problem: Elimination: ?Goal: Will not experience complications related to bowel motility ?Outcome: Progressing ?Goal: Will not experience complications related to urinary retention ?Outcome: Progressing ?  ?Problem: Pain Managment: ?Goal: General experience of comfort will improve ?Outcome: Progressing ?  ?Problem: Safety: ?Goal: Ability to remain free from injury will improve ?Outcome: Progressing ?  ?Problem: Skin Integrity: ?Goal: Risk for impaired skin integrity will decrease ?Outcome: Progressing ?  ?

## 2021-05-10 NOTE — Progress Notes (Addendum)
?Progress Note ? ? ?Patient: Jacqueline Harris HUD:[id number] DOB: 05-02-1945 DOA: 05/07/2021     2 ?DOS: the patient was seen and examined on 05/10/2021 ?  ?Brief hospital course: ?Mrs. Hearst was admitted to the hospital with the working diagnosis of decompensated heart failure in the setting of severe pulmonary hypertension.   ? ?76 yo female with the past medical history of heart failure, pulmonary hypertension, interstitial lung disease, chronic hypoxemic respiratory failure who presented with dyspnea and lower extremity edema. Reported worsening symptoms for the last 7 days, despite increase dose of diuretic therapy at home.  ?Recent hospitalization 03/19 to 03/22 for decompensated heart failure. Required left thoracentesis, 850 cc of exudate fluid.  ?On his initial physical examination her blood pressure was 135/94, HR 98, RR 21 02 saturation 92% on 2 L per min per [place], lungs with diffuse rales bilaterally, no wheezing, poor inspiratory effort, heart with S1 and S2 present and rhythmic, no gallops, abdomen not distended and positive lower extremity edema ++.  ? ?Na 139, K 3,5 Cl 107, bicarbonate 23, glucose 182, bun 32 cr 1,34 ?BNP 321 ?High sensitive troponin 9 and 13.  ?Lactic acid 2,6 ?Wbc 8.2 hgb 9,6 plt 348  ? ?Chest radiograph with cardiomegaly, bilateral hilar vascular congestion with bilateral interstitial infiltrates, more predominant to the left lower lobe, bilateral pleural effusions more left than right.  ? ?EKG 89 bpm, left axis deviation, normal intervals, sinus rhythm with low voltage, no significant ST segment or T wave changes.  ? ?Patient was placed on furosemide for diuresis.  ?Poor prognosis considering pulmonary hypertension and ILD.  ?Palliative care services consulted.  ? ?Assessment and Plan: ?* Acute on chronic diastolic heart failure ([place]) ?Echocardiogram with preserved LV systolic function with EF 50 to 85%, RV systolic function with mild to moderate reduction, moderate enlarged RV cavity.  Severe elevated pulmonary artery pressure. RVSP 69.2 mmHg.  ?Small pericardial effusion, mild to moderate tricuspid regurgitation. ? ?Urine output 2,550 ml over last 24 hrs ?Blood pressure systolic is 885 to 027 mmHg.  ? ?Currently on furosemide 40 mg daily ?Continue volume overload, add 60 mg Iv furosemide x1 ? ?Patient with poor prognosis due to pulmonary hypertension.  ? ?Code status has been changed to DNR ?Follow up with palliative care.  ? ? ?Pulmonary hypertension ([place]) ?Interstitial lung disease. UIP ?Sever pulmonary hypertension with acute on chronic hypoxemic respiratory failure ?Acute pulmonary edema with bilateral pleural effusions.  ? ?Diuresis with furosemide, to target further negative fluid balance.  ?Continue with sildenafil, macitetan and bronchodilator therapy.  ? ?Oxymetry is 95% on 3.5 L/min per [place]. ? ?No clinical signs of ILD flare at this point, most of her symptoms due to decompensated heart failure, acute on chronic core [place].  ?Keep 02 saturation 88% or greater, continue oxymetry monitoring.  ? ?Acute kidney injury superimposed on chronic kidney disease ([place]) ?CKD stage 3B ? ?Continue hypervolemia.  ?Continue diuresis with furosemide, add one IV dose today. ? ?Add 40 meq Kcl to prevent hypokalemia and check electrolytes in am, including Mg. ?Follow up renal function in am.  ?Avoid hypotension and nephrotoxic medications.  ? ?Type 2 diabetes mellitus with hyperlipidemia ([place]) ?Continue glucose cover and monitoring with insulin sliding scale.  ?  ?Fasting glucose today at 107 mg/dl.  ?Patient with poor oral intake.  ? ?Anemia ?Anemia of chronic disease with anemia or iron deficiency.  ?Hgb is 9,5 range  ?Continue close follow up of cell count.  ? ?Depression ?Anxiety ?Plan to continue  with alprazolam and citalopram.  ? ? ? ? ?  ? ?Subjective: Patient with worsening dyspnea toady, persistent lower extremity edema, no chest pain  ? ?Physical Exam: ?Vitals:  ? 05/10/21 0435 05/10/21 0807  05/10/21 0846 05/10/21 1132  ?BP: (!) 136/53 (!) 110/46  132/61  ?Pulse: 91 91 94 91  ?Resp: (!) 24 (!) 27 (!) 21 (!) 24  ?Temp: 98 ?F (36.7 ?C) 98.5 ?F (36.9 ?C)  98 ?F (36.7 ?C)  ?TempSrc: Oral Oral  Oral  ?SpO2: 95% 95% 98% 96%  ?Weight: 57.3 kg     ? ?Neurology awake and alert ?ENT with mild pallor ?Cardiovascular with S1 and S2 loud p2, positive systolic murmur at the right sternal border 3/6 ?Positive severe JVD ?Positive lower extremity edema ++ ?Respiratory with diffuse bilateral rales with no wheezing ?Abdomen not distended  ?Data Reviewed: ? ? ? ?Family Communication: I spoke with patient's daugher at the bedside, we talked in detail about patient's condition, plan of care and prognosis and all questions were addressed. ?Patient will decide if she goes home with home health services and then transition to hospice or discharge home directly with hospice services.  ? ? ?Disposition: ?Status is: Inpatient ?Remains inpatient appropriate because: heart failure  ? Planned Discharge Destination: Home ? ?Author: ?Tawni Millers, MD ?05/10/2021 2:31 PM ? ?For on call review [email or web address].  ?

## 2021-05-10 NOTE — Progress Notes (Signed)
? ? ? ?  Consult received for Jacqueline Harris for goals of care discussion. Chart reviewed. Patient assessed and shared she would like for daughter [name] to be present for [place] discussion. ? ?I was able to speak with [name] over the phone. Initial palliative and GOC discussion scheduled for today at 12:30pm at patient's bedside.  ? ?Detailed note and recommendations to follow once GOC has been completed.  ? ?Thank you for your referral and allowing PMT to assist in [name]'s care.  ? ?Verdell Carmine. Holbert Caples, DNP, FNP-BC ?Palliative Medicine Team ?Team Phone # [phone] ? ?NO CHARGE   ?

## 2021-05-11 DIAGNOSIS — I272 Pulmonary hypertension, unspecified: Secondary | ICD-10-CM | POA: Diagnosis not present

## 2021-05-11 DIAGNOSIS — E1169 Type 2 diabetes mellitus with other specified complication: Secondary | ICD-10-CM | POA: Diagnosis not present

## 2021-05-11 DIAGNOSIS — N179 Acute kidney failure, unspecified: Secondary | ICD-10-CM | POA: Diagnosis not present

## 2021-05-11 DIAGNOSIS — I5033 Acute on chronic diastolic (congestive) heart failure: Secondary | ICD-10-CM | POA: Diagnosis not present

## 2021-05-11 LAB — BASIC METABOLIC PANEL
Anion gap: 6 (ref 5–15)
BUN: 29 mg/dL — ABNORMAL HIGH (ref 8–23)
CO2: 29 mmol/L (ref 22–32)
Calcium: 8.1 mg/dL — ABNORMAL LOW (ref 8.9–10.3)
Chloride: 103 mmol/L (ref 98–111)
Creatinine, Ser: 1.16 mg/dL — ABNORMAL HIGH (ref 0.44–1.00)
GFR, Estimated: 50 mL/min — ABNORMAL LOW (ref >60)
Glucose, Bld: 122 mg/dL — ABNORMAL HIGH (ref 70–99)
Potassium: 4.2 mmol/L (ref 3.5–5.1)
Sodium: 138 mmol/L (ref 135–145)

## 2021-05-11 LAB — MAGNESIUM: Magnesium: 1.8 mg/dL (ref 1.7–2.4)

## 2021-05-11 MED ORDER — IPRATROPIUM-ALBUTEROL 0.5-2.5 (3) MG/3ML IN SOLN
3.0000 mL | Freq: Two times a day (BID) | RESPIRATORY_TRACT | Status: DC
  Administered 2021-05-11 – 2021-05-15 (×9): 3 mL via RESPIRATORY_TRACT
  Filled 2021-05-11 (×10): qty 3

## 2021-05-11 MED ORDER — ENOXAPARIN SODIUM 40 MG/0.4ML IJ SOSY
40.0000 mg | PREFILLED_SYRINGE | Freq: Every day | INTRAMUSCULAR | Status: DC
  Administered 2021-05-11 – 2021-05-12 (×2): 40 mg via SUBCUTANEOUS
  Filled 2021-05-11 (×4): qty 0.4

## 2021-05-11 NOTE — Progress Notes (Signed)
?                                                   ?Palliative Care Progress Note, Assessment & Plan  ? ?Patient Name: Jacqueline Harris       Date: 05/11/2021 ?DOB: Apr 17, 1945  Age: 76 y.o. MRN#: [id number] ?Attending Physician: Domenic Polite, MD ?Primary Care Physician: Glenda Chroman, MD ?Admit Date: 05/07/2021 ? ?Reason for Consultation/Follow-up: Establishing goals of care ? ?Subjective: ?Patient is sitting in bed with nasal cannula in place.  She acknowledges my presence and is able to make her wishes known.  Her sister Lelon Frohlich, PT, RN, and Dr. Broadus John are at bedside.  Patient endorses no significant events overnight.  She has no acute complaints. ? ?HPI: ?76 y.o. female  with past medical history of acute on chronic HF, pulmonary HTN, interstitial lung disease, chronic hypoxemic respiratory failure, CKD (stage IIIb), type 2 diabetes, anemia, and Raynaud's admitted on 05/07/2021 with dyspnea and lower extremity edema now that worsened over the course of 7 days. ?  ?Palliative medicine team was consulted at request of patient and patient's daughter (former outpatient palliative NP) to discuss goals of care. ? ?Summary of counseling/coordination of care: ?After reviewing the patient's chart and assessing the patient at bedside, I spoke with patient regarding goals of her care.  Patient shares she thought a lot about her conversation yesterday and has decided to go home and continue OT and PT therapies.  She says she wants to try to make herself a strong as she can.  Patient shared with Dr. Broadus John that she knows her heart is weak and that she is not getting any better. ? ?We discussed rehospitalization.  Patient states she does not ever want to have to come back to the hospital.  Her sister is concerned that if the patient has another "spell" at home with not being able to breathe she  is not sure if she will be able to help her.  Patient is worried that patient will have a respiratory issue at home and that EMS will not be able to help her fast enough. We reviewed that my recommendations for medicaitons at discharge include as needed Ativan and morphine to be available should the patient have respiratory distress. We discussed that avoiding hospitalization and managing symptoms for comfort at home are in line with a hospice plan of care. Patient shares she is not ready to call hospice but wants to see how well she does at home with therapies and then will likely call Hospice after 2-3 weeks of therapy, if not earlier.  ? ?Patient receptive to outpatient palliative to follow at discharge. Questions and concerns were addressed. Goals are clear. Plan is set. Surrogate decision maker would be patient's daughter Maudie Mercury. ? ?PMT will shadow the patient's chart and intervene at patient/family's requestion, if goals change, or if patient's health declines. ? ?Code Status: ?DNR ? ?Prognosis: ?< 6 months ? ?Discharge Planning: ?Home with Palliative Services ? ?Recommendations/Plan: ?Morphine PO liquid 2-25m Q6H PRN for SOB, increased WOB, air hunger ?Ativan 12mPO Q6H PRN for anxiety, agitation ? ?Care plan was discussed with Dr. JoBroadus JohnRN, PT, patient, patient's sister ? ?Physical Exam ?Vitals reviewed.  ?Constitutional:   ?   General: She is not in acute distress. ?   Appearance: She is ill-appearing.  ?HENT:  ?  Head: Normocephalic.  ?Cardiovascular:  ?   Rate and Rhythm: Tachycardia present.  ?Pulmonary:  ?   Breath sounds: Examination of the left-middle field reveals rhonchi. Examination of the right-lower field reveals decreased breath sounds. Examination of the left-lower field reveals decreased breath sounds. Decreased breath sounds and rhonchi present.  ?Abdominal:  ?   Palpations: Abdomen is soft.  ?Musculoskeletal:  ?   Comments: Generalized weakness  ?Neurological:  ?   Mental Status: She is  alert and oriented to person, place, and time.  ?Psychiatric:     ?   Mood and Affect: Mood normal. Mood is not anxious.     ?   Behavior: Behavior normal. Behavior is not agitated.  ?         ? ?Palliative Assessment/Data: 40% ? ? ? ?Total Time 25 minutes  ?Greater than 50%  of this time was spent counseling and coordinating care related to the above assessment and plan. ? ?Thank you for allowing the Palliative Medicine Team to assist in the care of this patient. ? ?Verdell Carmine. Nawal Burling, DNP, FNP-BC ?Palliative Medicine Team ?Team Phone # [phone] ?  ?

## 2021-05-11 NOTE — Progress Notes (Signed)
Physical Therapy Treatment ?Patient Details ?Name: Jacqueline Harris ?MRN: [id number] ?DOB: [birth year] ?Today's Date: 05/11/2021 ? ? ?History of Present Illness Patient is a 76 y/o female who presents on 4/22 with SOB and LE swelling. Admitted with acute on chronic diastolic CHF. Last admission 3/23 for respiratory failure with hypoxia secondary to CHF. PMH includes HTN, GAVE syndrome, CHF. ? ?  ?PT Comments  ? ? Unable to progress functional mobility today secondary to pt refusal.  Of note, pt demos SpO2 in mid 80s upon arrival to room and requires NSG to inc O2 from 3L to 5L to maintain SpO2 in 90s at rest.  Due to feelings of SOB and fatigue, pt opts to only do therex in bed despite encouragement from PT to sit up in chair to eat.  Palliative care and sister present during tx.  PT to continue to encourage OOB activity as pt's preference is to return home alone and trial therapy x 3 weeks before transitioning to hospice.   ?Recommendations for follow up therapy are one component of a multi-disciplinary discharge planning process, led by the attending physician.  Recommendations may be updated based on patient status, additional functional criteria and insurance authorization. ? ?Follow Up Recommendations ? Home health PT ?  ?  ?Assistance Recommended at Discharge  (Unable to determine as pt refused OOB today due to breathing difficulty)  ?Patient can return home with the following  (Unable to determine as pt refused OOB today due to breathing difficulty) ?  ?Equipment Recommendations ?    ?  ?Recommendations for Other Services   ? ? ?  ?Precautions / Restrictions Precautions ?Precautions: Other (comment) ?Precaution Comments: Monitor O2 sats ?Restrictions ?Weight Bearing Restrictions: No  ?  ? ?Mobility ? Bed Mobility ?Overal bed mobility: Needs Assistance ?Bed Mobility: Rolling ?Rolling: Min assist ?  ?  ?  ?  ?General bed mobility comments: PT and NSG assist to roll pt B to change wet chuck pad.  Pt also req's min A  to scoot up in bed. ?Patient Response: Cooperative ? ?Transfers ?  ?  ?  ?  ?  ?  ?  ?  ?  ?General transfer comment: Following therex, PT encourages pt to transfer to chair to eat lunch but pt declines stating she wants to stay in bed.  Of note, palliative team is present and has just had discussion with pt re: being in charge of her own therapy and letting staff know what she does and does not want to do as her long term prognosis is poor. ?  ? ?Ambulation/Gait ?  ?  ?  ?  ?  ?  ?  ?  ? ? ?Stairs ?  ?  ?  ?  ?  ? ? ?Wheelchair Mobility ?  ? ?Modified Rankin (Stroke Patients Only) ?  ? ? ?  ?Balance   ?  ?  ?  ?  ?  ?  ?  ?  ?  ?  ?  ?  ?  ?  ?  ?  ?  ?  ?  ? ?  ?Cognition Arousal/Alertness: Awake/alert ?Behavior During Therapy: [facility] for tasks assessed/performed ?Overall Cognitive Status: Within Functional Limits for tasks assessed ?  ?  ?  ?  ?  ?  ?  ?  ?  ?  ?  ?  ?  ?  ?  ?  ?General Comments: Pt is supine in bed when PT arrives.  NSG present.  Pt/NSG  state that she just woke up.  O2 sats noted to be in low 80s, NSG has to inc O2 level to 5L for O2 sats to maintain 90% at rest.  Pt. is agreeable to work with PT.  Requests to start with LE therex. ?  ?  ? ?  ?Exercises General Exercises - Lower Extremity ?Ankle Circles/Pumps: AROM, Both, 20 reps ?Heel Slides: AROM, Both, 20 reps ?Hip ABduction/ADduction: AROM, Both, 20 reps ?Straight Leg Raises: AAROM, Both, 20 reps ? ?  ?General Comments General comments (skin integrity, edema, etc.): O2 sats fluctuate between 83-91% with LE therex.  Pt educated on breathing techniques but reports that her nose is "stuffed up" and it is difficult to breath through her nose. ?  ?  ? ?Pertinent Vitals/Pain Pain Assessment ?Pain Assessment: No/denies pain  ? ? ?Home Living   ?  ?  ?  ?  ?  ?  ?  ?  ?  ?   ?  ?Prior Function    ?  ?  ?   ? ?PT Goals (current goals can now be found in the care plan section) Progress towards PT goals: Not progressing toward goals - comment (Refusing  functional mobility at this time) ? ?  ?Frequency ? ? ?   ? ? ? ?  ?PT Plan Current plan remains appropriate (Pt lives alone and wants to return home.  Of note, pt is unwilling to practice transfers at this time to determine safety and equipment needs.)  ? ? ?Co-evaluation   ?  ?  ?  ?  ? ?  ?AM-PAC PT "6 Clicks" Mobility   ?Outcome Measure ? Help needed turning from your back to your side while in a flat bed without using bedrails?: A Little ?Help needed moving from lying on your back to sitting on the side of a flat bed without using bedrails?: A Little ?Help needed moving to and from a bed to a chair (including a wheelchair)?: A Little ?Help needed standing up from a chair using your arms (e.g., wheelchair or bedside chair)?: A Little ?Help needed to walk in hospital room?: A Lot ?Help needed climbing 3-5 steps with a railing? : A Lot ?6 Click Score: 16 ? ?  ?End of Session   ?Activity Tolerance: Patient limited by fatigue;Other (comment) (Limited by SOB) ?Patient left: in bed;with call bell/phone within reach;with bed alarm set;with family/visitor present ?  ?  ?  ? ? ?Time: 3403-5248 ?PT Time Calculation (min) (ACUTE ONLY): 23 min ? ?Charges:  $Therapeutic Exercise: 8-22 mins ?$Therapeutic Activity: 8-22 mins          ?          ? ?Lula Kolton A. Brayah Urquilla, PT, DPT ?Acute Rehabilitation Services ?Office: [phone]  ? ? ?[place] ?05/11/2021, 11:52 AM ? ?

## 2021-05-11 NOTE — Progress Notes (Signed)
TRH night cross cover note: ? ?I was contacted by RN with request for clarification regarding the status of the patient's duo nebulizer treatments.  RN conveyed that the patient was previously on scheduled duo nebulizer treatments, but that these were discontinued earlier during dayshift.  The patient reports feeling slightly more short of breath than earlier in the day, and requests a duo nebulizer treatment at this time.  Not associated with further increase in supplemental oxygen demands. ? ?Per my chart review, including review of most recent rounding hospitalist progress note, this is a 76 year old female with copd, ILD, severe pulmonary hypertension with right ventricular heart failure, here with volume overload.  ? ?Per my review of most recent hospitalist progress note, plan for the patient's COPD, interstitial lung disease, included continuation of bronchodilator therapy.  In maintaining consistency with this plan as well as the patient's request, I subsequently placed order for scheduled duo nebulizer treatments. ? ? ? ?Babs Bertin, DO ?Hospitalist ? ?

## 2021-05-11 NOTE — Progress Notes (Signed)
?  05/11/21 1425  ?Mobility  ?Activity Refused mobility ?(Declined for unspecified reasons)  ? ?Will check back as time permits. ? ?

## 2021-05-11 NOTE — Progress Notes (Addendum)
?PROGRESS NOTE ? ? ? ?Jacqueline Harris  MBT:[id number] DOB: [birth year] DOA: 05/07/2021 ?PCP: Glenda Chroman, MD  ?75/F with chronic respiratory failure on 3 L home O2, COPD, interstitial lung disease, chronic diastolic CHF, pulmonary hypertension presented to the ED with worsening dyspnea and edema despite recent increases in diuretic dose. ?-Recently hospitalized 3/19-3/22 for decompensated heart failure, underwent thoracentesis then ?-Chest x-ray in the ED noted cardiomegaly, pulmonary vascular congestion, pleural effusions ?-Diuresed with IV Lasix, seen by cardiology in consultation, prognosis felt to be poor in the setting of pulmonary hypertension, ILD/COPD ?-Palliative care consulted ? ?Subjective: ?-Feels poorly, some dyspnea at rest ? ?Assessment and Plan: ? ?Acute on chronic diastolic heart failure ([place]) ?Severe pulmonary hypertension ?-Echo with EF of 55-60%, moderate to severe RV dysfunction, moderately enlarged RV cavity, severe PAH ?-Diuresed with IV Lasix she is 10.7 L negative ?-Switch to oral Lasix now, allergic to aldactone ?-Overall prognosis is poor in the setting of chronic lung disease, ILD, severe pulmonary hypertension, palliative care consulted, now DNR ? ?COPD/chronic respiratory failure ?Interstitial lung disease ?-No clinical signs of ILD flare at this point, most of her symptoms due to decompensated heart failure, acute on chronic [place].  ? ?Acute kidney injury superimposed on chronic kidney disease ([place]) ?CKD stage 3B ?-Diuresed with IV Lasix, kidney function is stable, continue oral Lasix ? ?Type 2 diabetes mellitus with hyperlipidemia ([place]) ?-CBGs are stable, continue sliding scale insulin ? ?Anemia due to chronic disease and iron deficiency ?-Monitor hemoglobin ? ?Anxiety/depression ?-Continue current regimen of citalopram and alprazolam ? ?DVT prophylaxis: Add Lovenox ?Code Status: DNR ?Family Communication: Discussed with patient and sister at bedside ?Disposition Plan: Home with  palliative care versus hospice services ? ?Consultants:  ?Cardiology, palliative care ? ?Procedures:  ? ?Antimicrobials:  ? ? ?Objective: ?Vitals:  ? 05/11/21 0837 05/11/21 1022 05/11/21 1052 05/11/21 1350  ?BP:  (!) 123/53  131/60  ?Pulse:  (!) 101 (!) 101 92  ?Resp:  (!) 22  14  ?Temp:  (!) 97.4 ?F (36.3 ?C)  98.2 ?F (36.8 ?C)  ?TempSrc:  Oral  Oral  ?SpO2: 92% (!) 88% (!) 85% 95%  ?Weight:      ? ? ?Intake/Output Summary (Last 24 hours) at 05/11/2021 1444 ?Last data filed at 05/11/2021 1351 ?Gross per 24 hour  ?Intake 240 ml  ?Output 4950 ml  ?Net -4710 ml  ? ?Filed Weights  ? 05/09/21 0405 05/10/21 0435 05/11/21 0425  ?Weight: 60 kg 57.3 kg 56.3 kg  ? ? ?Examination: ? ?General exam: Chronically ill thinly built female laying in bed, AAOx3 ?HEENT: No JVD ?CVS: S1-S2, regular rhythm ?Lungs: Poor air movement bilaterally, decreased at the bases ?Abdomen: Soft, nontender, bowel sounds present ?Extremities: Trace edema ?Skin: No rashes ?Psychiatry:  Mood & affect appropriate.  ? ? ? ?Data Reviewed:  ? ?CBC: ?Recent Labs  ?Lab 05/07/21 ?1932 05/08/21 ?0137  ?WBC 8.2 7.2  6.9  ?NEUTROABS 5.8 5.2  ?HGB 9.6* 9.5*  9.2*  ?HCT 33.1* 31.7*  31.7*  ?MCV 72.3* 72.5*  72.9*  ?PLT 348 335  332  ? ?Basic Metabolic Panel: ?Recent Labs  ?Lab 05/07/21 ?1932 05/08/21 ?0137 05/09/21 ?5364 05/10/21 ?0307 05/11/21 ?6803  ?NA 139 138 137 137 138  ?K 3.5 3.5 4.0 3.8 4.2  ?CL 107 105 106 104 103  ?CO2 _0 ?GLUCOSE 182* 143* 109* 107* 122*  ?BUN 32* 30* 32* 32* 29*  ?CREATININE 1.34* 1.25* 1.38* 1.21* 1.16*  ?CALCIUM 8.3* 8.1*  7.8* 7.9* 8.1*  ?MG  --  1.8  --   --  1.8  ?PHOS  --  3.0  --   --   --   ? ?GFR: ?Estimated Creatinine Clearance: 37.3 mL/min (A) (by C-G formula based on SCr of 1.16 mg/dL (H)). ?Liver Function Tests: ?Recent Labs  ?Lab 05/07/21 ?1932 05/08/21 ?0137  ?AST 13* 15  ?ALT 5 10  ?ALKPHOS 67 71  ?BILITOT 0.4 0.7  ?PROT 5.8* 5.4*  ?ALBUMIN 2.7* 2.1*  ? ?No results for input(s): LIPASE, AMYLASE in the last  168 hours. ?No results for input(s): AMMONIA in the last 168 hours. ?Coagulation Profile: ?Recent Labs  ?Lab 05/07/21 ?1932  ?INR 1.2  ? ?Cardiac Enzymes: ?No results for input(s): CKTOTAL, CKMB, CKMBINDEX, TROPONINI in the last 168 hours. ?BNP (last 3 results) ?No results for input(s): PROBNP in the last 8760 hours. ?HbA1C: ?No results for input(s): HGBA1C in the last 72 hours. ?CBG: ?Recent Labs  ?Lab 05/08/21 ?1155 05/08/21 ?1530 05/08/21 ?2134 05/09/21 ?1201 05/09/21 ?1516  ?GLUCAP 110* 99 106* 202* 120*  ? ?Lipid Profile: ?No results for input(s): CHOL, HDL, LDLCALC, TRIG, CHOLHDL, LDLDIRECT in the last 72 hours. ?Thyroid Function Tests: ?No results for input(s): TSH, T4TOTAL, FREET4, T3FREE, THYROIDAB in the last 72 hours. ?Anemia Panel: ?No results for input(s): VITAMINB12, FOLATE, FERRITIN, TIBC, IRON, RETICCTPCT in the last 72 hours. ?Urine analysis: ?   ?Component Value Date/Time  ? [place] YELLOW 11/21/2019 1900  ? APPEARANCEUR CLEAR 11/21/2019 1900  ? LABSPEC 1.010 11/21/2019 1900  ? PHURINE 5.5 11/21/2019 1900  ? GLUCOSEU NEGATIVE 11/21/2019 1900  ? [place] NEGATIVE 11/21/2019 1900  ? [place] NEGATIVE 11/21/2019 1900  ? [place] NEGATIVE 11/21/2019 1900  ? PROTEINUR NEGATIVE 11/21/2019 1900  ? NITRITE NEGATIVE 11/21/2019 1900  ? LEUKOCYTESUR NEGATIVE 11/21/2019 1900  ? ?Sepsis Labs: ?_0 (procalcitonin:4,lacticidven:4) ? ?)No results found for this or any previous visit (from the past 240 hour(s)).  ? ?Radiology Studies: ?No results found. ? ? ?Scheduled Meds: ? citalopram  20 mg Oral Daily  ? feeding supplement  237 mL Oral BID BM  ? ferrous sulfate  325 mg Oral Q breakfast  ? furosemide  40 mg Oral Daily  ? ipratropium-albuterol  3 mL Nebulization BID  ? macitentan  10 mg Oral Daily  ? multivitamin with minerals  1 tablet Oral Daily  ? pantoprazole  40 mg Oral BID  ? sildenafil  20 mg Oral TID  ? tobramycin   Left Eye QID  ? ?Continuous Infusions: ? ? LOS: 3 days  ? ? ?Time spent:  76mn ? ?PDomenic Polite MD ?[facility] ? ? ?05/11/2021, 2:44 PM  ?  ?

## 2021-05-12 DIAGNOSIS — N179 Acute kidney failure, unspecified: Secondary | ICD-10-CM | POA: Diagnosis not present

## 2021-05-12 DIAGNOSIS — I272 Pulmonary hypertension, unspecified: Secondary | ICD-10-CM | POA: Diagnosis not present

## 2021-05-12 DIAGNOSIS — Z7189 Other specified counseling: Secondary | ICD-10-CM

## 2021-05-12 DIAGNOSIS — J9621 Acute and chronic respiratory failure with hypoxia: Secondary | ICD-10-CM

## 2021-05-12 DIAGNOSIS — I509 Heart failure, unspecified: Secondary | ICD-10-CM | POA: Diagnosis not present

## 2021-05-12 DIAGNOSIS — J849 Interstitial pulmonary disease, unspecified: Secondary | ICD-10-CM

## 2021-05-12 DIAGNOSIS — I5033 Acute on chronic diastolic (congestive) heart failure: Secondary | ICD-10-CM | POA: Diagnosis not present

## 2021-05-12 LAB — CBC
HCT: 28.8 % — ABNORMAL LOW (ref 36.0–46.0)
Hemoglobin: 8.7 g/dL — ABNORMAL LOW (ref 12.0–15.0)
MCH: 21.6 pg — ABNORMAL LOW (ref 26.0–34.0)
MCHC: 30.2 g/dL (ref 30.0–36.0)
MCV: 71.6 fL — ABNORMAL LOW (ref 80.0–100.0)
Platelets: 363 10*3/uL (ref 150–400)
RBC: 4.02 MIL/uL (ref 3.87–5.11)
RDW: 25.9 % — ABNORMAL HIGH (ref 11.5–15.5)
WBC: 7.1 10*3/uL (ref 4.0–10.5)
nRBC: 0 % (ref 0.0–0.2)

## 2021-05-12 LAB — BASIC METABOLIC PANEL
Anion gap: 6 (ref 5–15)
BUN: 28 mg/dL — ABNORMAL HIGH (ref 8–23)
CO2: 29 mmol/L (ref 22–32)
Calcium: 8.1 mg/dL — ABNORMAL LOW (ref 8.9–10.3)
Chloride: 102 mmol/L (ref 98–111)
Creatinine, Ser: 1.16 mg/dL — ABNORMAL HIGH (ref 0.44–1.00)
GFR, Estimated: 50 mL/min — ABNORMAL LOW (ref >60)
Glucose, Bld: 130 mg/dL — ABNORMAL HIGH (ref 70–99)
Potassium: 3.9 mmol/L (ref 3.5–5.1)
Sodium: 137 mmol/L (ref 135–145)

## 2021-05-12 MED ORDER — FUROSEMIDE 10 MG/ML IJ SOLN
40.0000 mg | Freq: Once | INTRAMUSCULAR | Status: AC
  Administered 2021-05-12: 40 mg via INTRAVENOUS
  Filled 2021-05-12: qty 4

## 2021-05-12 NOTE — Care Management Important Message (Signed)
Important Message ? ?Patient Details  ?Name: Jacqueline Harris ?MRN: [id number] ?Date of Birth: [birth year] ? ? ?Medicare Important Message Given:  Yes ? ? ? ? ?[name] ?05/12/2021, 10:45 AM ?

## 2021-05-12 NOTE — Progress Notes (Signed)
?  05/12/21 2054  ?Assess: MEWS Score  ?Temp 98.5 ?F (36.9 ?C)  ?BP 139/64  ?Pulse Rate (!) 102  ?ECG Heart Rate (!) 103  ?Resp (!) 28  ?SpO2 93 %  ?O2 Device Nasal Cannula  ?O2 Flow Rate (L/min) 5 L/min  ?Assess: MEWS Score  ?MEWS Temp 0  ?MEWS Systolic 0  ?MEWS Pulse 1  ?MEWS RR 2  ?MEWS LOC 0  ?MEWS Score 3  ?MEWS Score Color Yellow  ?Assess: if the MEWS score is Yellow or Red  ?Were vital signs taken at a resting state? Yes  ?Focused Assessment No change from prior assessment  ?Early Detection of Sepsis Score *See Row Information* Low  ?MEWS guidelines implemented *See Row Information* Yes  ?Take Vital Signs  ?Increase Vital Sign Frequency  Yellow: Q 2hr X 2 then Q 4hr X 2, if remains yellow, continue Q 4hrs ?(continue at q 4 hrs)  ?Escalate  ?MEWS: Escalate Yellow: discuss with charge nurse/RN and consider discussing with provider and RRT  ?Notify: Charge Nurse/RN  ?Name of Charge Nurse/RN Notified Marcelyn Ditty RN  ?Date Charge Nurse/RN Notified 05/12/21  ?Time Charge Nurse/RN Notified 2054  ?Document  ?Patient Outcome Other (Comment) ?(pt is stable and remains on unit)  ?Progress note created (see row info) Yes  ? ? ?

## 2021-05-12 NOTE — Progress Notes (Addendum)
?                                                   ?Palliative Care Progress Note, Assessment & Plan  ? ?Patient Name: Jacqueline Harris       Date: 05/12/2021 ?DOB: [birth year]  Age: 76 y.o. MRN#: [id number] ?Attending Physician: Domenic Polite, MD ?Primary Care Physician: Glenda Chroman, MD ?Admit Date: 05/07/2021 ? ?Reason for Consultation/Follow-up: Establishing goals of care ? ?Subjective: ?Patient is sitting in bed in no apparent distress.  She acknowledges my presence and is able to make her wishes known.  Nasal cannula is in place.  Patient offers no complaints.  Patient's daughter and sister are at bedside. ? ?HPI: ?76 y.o. female  with past medical history of acute on chronic HF, pulmonary HTN, interstitial lung disease, chronic hypoxemic respiratory failure, CKD (stage IIIb), type 2 diabetes, anemia, and Raynaud's admitted on 05/07/2021 with dyspnea and lower extremity edema now that worsened over the course of 7 days. ?  ?Palliative medicine team was consulted at request of patient and patient's daughter (former outpatient palliative NP) to discuss goals of care. ? ?Summary of counseling/coordination of care: ?Attending Dr. Broadus John and pulmonology NP Bowser reach out to me via secure chat to discuss disposition planning for Ms. Minichiello.  Patient was discussed during heart failure rounds this morning.  According to NP Bowser, unfortunately patient does not have any viable options from a pulmonology standpoint.  Given the patient wants to go home with PT/OT, she is at high risk for rehospitalization. ? ?I reengaged the patient and her daughter and sister at bedside.  I reviewed that the goals of her care as discussed when I first met her were to focus on her quality of life.  She continues to want to get home, not ever return to the hospital, and spend as much time with family as  possible. ? ?We discussed the pros and cons of home with PT/OT and home with hospice.  Patient's sister offered insight into how patient could do physical therapy exercises on her own at her leisure when she is home by herself.  Sister also expressed concern that patient needs more support in the home than what PT/OT services can offer. ? ?After much discussion, patient declared she would like to go home with hospice.  Daughter and sister at bedside were in agreement.  I shared that I cannot offer choice of hospices but patient was clear that she already would like to have [facility] be her choice. ? ?We discussed that this would not change the patient's plan of care during this hospitalization (aka NOT on comfort measures). We reviewed that the medical team will continue to optimize her while hopsitalized.  At discharge, patient will be enacting her hospice benefits.  ? ?Therapeutic silence and active listening provided for patient, daughter Maudie Mercury, and patient's sister and to share their thoughts and emotions regarding the patient's current health status. ? ?Questions and concerns were addressed.  Patient and family were encouraged to call with any questions.  PMT will continue to shadow the patient's chart and intervene at patient/family's request, if goals change, or if patient's health status declines. ? ?Code Status: ?DNR ? ?Prognosis: ?< 6 months ? ?Discharge Planning: ?Home with Hospice ? ?Recommendations/Plan: ?Morphine PO liquid 2-61m Q6H PRN for SOB, increased WOB,  air hunger ?Ativan 32m PO Q6H PRN for anxiety, agitation ? ?Care plan was discussed with Dr. JBroadus John NP BDorris Singh RN, patient, patient's daughter KMaudie Mercury patient's sister ALelon Frohlich? ?SW made aware of patient's wishes to go home with hospice via secure chat ? ?Physical Exam ?Vitals reviewed.  ?Constitutional:   ?   General: She is not in acute distress. ?   Appearance: She is not ill-appearing.  ?HENT:  ?   Head: Normocephalic.  ?Cardiovascular:   ?   Rate and Rhythm: Normal rate.  ?Pulmonary:  ?   Breath sounds: Examination of the left-middle field reveals rhonchi. Examination of the right-lower field reveals decreased breath sounds. Examination of the left-lower field reveals decreased breath sounds. Decreased breath sounds and rhonchi present.  ?Abdominal:  ?   Palpations: Abdomen is soft.  ?Musculoskeletal:  ?   Cervical back: Normal range of motion.  ?   Comments: Generalized weakness  ?Skin: ?   General: Skin is warm and dry.  ?Neurological:  ?   Mental Status: She is alert and oriented to person, place, and time.  ?Psychiatric:     ?   Mood and Affect: Mood normal. Mood is not anxious.     ?   Behavior: Behavior normal. Behavior is not agitated.  ?         ? ?Palliative Assessment/Data: 40% ? ? ? ?Total Time 50 minutes  ?Greater than 50%  of this time was spent counseling and coordinating care related to the above assessment and plan. ? ?Thank you for allowing the Palliative Medicine Team to assist in the care of this patient. ? ?KVerdell Carmine Aditri Louischarles, DNP, FNP-BC ?Palliative Medicine Team ?Team Phone # [phone]?  ?

## 2021-05-12 NOTE — Progress Notes (Signed)
Occupational Therapy Treatment ?Patient Details ?Name: Jacqueline Harris ?MRN: [id number] ?DOB: [birth year] ?Today's Date: 05/12/2021 ? ? ?History of present illness Patient is a 76 y/o female who presents on 4/22 with SOB and LE swelling. Admitted with acute on chronic diastolic CHF. Last admission 3/23 for respiratory failure with hypoxia secondary to CHF. PMH includes HTN, GAVE syndrome, CHF. ?  ?OT comments ? Focus of session on educating pt and daughter in energy conservation strategies, pacing and determining any further DME needs. Pt does not want a hospital bed, educated in availability of wedge pillow. Daughter has ordered a bed rail. Pt and daughter agreeable to wheelchair as pt demonstrates poor endurance and difficulty walking short distances in her home to get to bathroom/bedroom. Pt agrees to home health OT.   ? ?Recommendations for follow up therapy are one component of a multi-disciplinary discharge planning process, led by the attending physician.  Recommendations may be updated based on patient status, additional functional criteria and insurance authorization. ?   ?Follow Up Recommendations ? Home health OT  ?  ?Assistance Recommended at Discharge Frequent or constant Supervision/Assistance  ?Patient can return home with the following ? A lot of help with bathing/dressing/bathroom;A lot of help with walking and/or transfers;Assistance with cooking/housework;Assist for transportation;Help with stairs or ramp for entrance ?  ?Equipment Recommendations ? Wheelchair (measurements OT);Wheelchair cushion (measurements OT)  ?  ?Recommendations for Other Services   ? ?  ?Precautions / Restrictions Precautions ?Precautions: Other (comment);Fall ?Precaution Comments: watch O2 ?Restrictions ?Weight Bearing Restrictions: No  ? ? ?  ? ?Mobility Bed Mobility ?  ?  ?  ?  ?  ?  ?  ?  ?  ? ?Transfers ?  ?  ?  ?  ?  ?  ?  ?  ?  ?  ?  ?  ?Balance   ?  ?  ?  ?  ?  ?  ?  ?  ?  ?  ?  ?  ?  ?  ?  ?  ?  ?  ?   ? ?ADL either  performed or assessed with clinical judgement  ? ?ADL   ?  ?  ?  ?  ?  ?  ?  ?  ?  ?  ?  ?  ?  ?  ?  ?  ?  ?  ?  ?General ADL Comments: Educated in energy conservation and benefits of w/c vs travel chair and hospital bed. ?  ? ?Extremity/Trunk Assessment   ?  ?  ?  ?  ?  ? ?Vision   ?  ?  ?Perception   ?  ?Praxis   ?  ? ?Cognition Arousal/Alertness: Awake/alert ?Behavior During Therapy: Flat affect ?Overall Cognitive Status: Within Functional Limits for tasks assessed ?  ?  ?  ?  ?  ?  ?  ?  ?  ?  ?  ?  ?  ?  ?  ?  ?General Comments: pt following conversation and interjecting as relevant. slightly decreased insight to deficits ?  ?  ?   ?Exercises   ? ?  ?Shoulder Instructions   ? ? ?  ?General Comments 6L O2 to maintain 87-91% at rest, pt declined OOB mobility  ? ? ?Pertinent Vitals/ Pain       Pain Assessment ?Pain Assessment: No/denies pain ? ?Home Living   ?  ?  ?  ?  ?  ?  ?  ?  ?  ?  ?  ?  ?  ?  ?  ?  ?  ?  ? ?  ?  Prior Functioning/Environment    ?  ?  ?  ?   ? ?Frequency ? Min 2X/week  ? ? ? ? ?  ?Progress Toward Goals ? ?OT Goals(current goals can now be found in the care plan section) ? Progress towards OT goals: Not progressing toward goals - comment (fatigue) ? ?Acute Rehab OT Goals ?OT Goal Formulation: With patient ?Time For Goal Achievement: 05/23/21 ?Potential to Achieve Goals: Fair  ?Plan Discharge plan remains appropriate   ? ?Co-evaluation ? ? ?   ?  ?  ?  ?  ? ?  ?AM-PAC OT "6 Clicks" Daily Activity     ?Outcome Measure ? ? Help from another person eating meals?: A Little ?Help from another person taking care of personal grooming?: A Little ?Help from another person toileting, which includes using toliet, bedpan, or urinal?: A Lot ?Help from another person bathing (including washing, rinsing, drying)?: A Lot ?Help from another person to put on and taking off regular upper body clothing?: A Little ?Help from another person to put on and taking off regular lower body clothing?: A Lot ?6 Click Score:  15 ? ?  ?End of Session Equipment Utilized During Treatment: Oxygen (6L) ? ?OT Visit Diagnosis: Unsteadiness on feet (R26.81);Other abnormalities of gait and mobility (R26.89);Muscle weakness (generalized) (M62.81) ?  ?Activity Tolerance Patient limited by fatigue ?  ?Patient Left in bed;with call bell/phone within reach;with family/visitor present ?  ?Nurse Communication   ?  ? ?   ? ?Time: 7793-9030 ?OT Time Calculation (min): 12 min ? ?Charges: OT General Charges ?$OT Visit: 1 Visit ?OT Treatments ?$Self Care/Home Management : 8-22 mins ? ?Nestor Lewandowsky, OTR/L ?Acute Rehabilitation Services ?Pager: [phone] ?Office: [phone]  ? ?Jacqueline Harris ?05/12/2021, 1:58 PM ?

## 2021-05-12 NOTE — Consult Note (Signed)
? ?NAME:  Jacqueline Harris, MRN:  [id number], DOB:  1945/07/06, LOS: 4 ?ADMISSION DATE:  05/07/2021, CONSULTATION DATE:  05/12/21 ?REFERRING MD:  Broadus John - TRH, CHIEF COMPLAINT:  SOB  ? ?History of Present Illness:  ?76 yo F PMH ILD pHTN R sided HF Chronic hypoxic respiratory failure on 2LNC prior DVT not on eliquis, pericardial effusion and CKD 3b admitted to [facility] 4/22 with CC SOB and progressive BLE edema. Diuresed, continued on home [place] meds per cardiology in consultation. Has not had robust improvement and palliative care was consulted to start discussions of possible hospice / palliative care paths.  ? ?4/27 pt has increasing SOB and O2 requirement. PCCM is consulted  in this setting to see if we can further optimize PAH  ? ?Pertinent  Medical History  ?PAH ?Ild ?R heart failure ?CKD IIIb ?Pericardial effusion ?DVT  ?Carotid stenosis ? ?Significant Hospital Events: ?Including procedures, antibiotic start and stop dates in addition to other pertinent events   ?4/22 admitted ?4/23 cards consult ?4/25 palli consult ?4/27 PCCM consult. Incr O2 to   ? ?Interim History / Subjective:  ?SpO2 86 on 5L -- incr to 7L with incr to 90 ? ?Objective   ?Blood pressure (!) 124/38, pulse (!) 102, temperature 98.7 ?F (37.1 ?C), temperature source Oral, resp. rate (!) 23, weight 56.3 kg, SpO2 92 %. ?   ?   ? ?Intake/Output Summary (Last 24 hours) at 05/12/2021 1113 ?Last data filed at 05/12/2021 1000 ?Gross per 24 hour  ?Intake 240 ml  ?Output 300 ml  ?Net -60 ml  ? ?Filed Weights  ? 05/09/21 0405 05/10/21 0435 05/11/21 0425  ?Weight: 60 kg 57.3 kg 56.3 kg  ? ? ?Examination: ?General: Chronically ill frail elderly F ?HENT: NCAT pink mm ?Lungs: tachypnea. Symmetrical chest expansion ?Cardiovascular:  cap refill < 3  ?Abdomen: thin ndnt  ?Extremities +1 pedal edema  ?Neuro: AAOx3 following commands ?GU: defer ? ?Resolved Hospital Problem list   ? ? ?Assessment & Plan:  ? ?Acute on chronic respiratory failure, multifactorial: ?Acute on  chronic HFpEF ?PAH ?ILD  ?-has been diuresed aggressively this admission and pt feels edema has improved greatly. Still SOB but this feels better than on admission.  O2 is increasing -- hasnt been very mobile  ?-discussed disease course of chronic conditions and difference between aggressive medical intervention for these vs sx management. I do not think there is much I can offer to better optimize PAH -- continue to optimize volume status and PAH meds.  ?-If we were going to aggressive try to improve hypoxia, would need aggressive mobility, IS --likely some atelectasis  ?-Discussed CXR to eval for possible effusion (required thora last admission) but pt articulates she wouldn't want a thora, so I will defer imaging.  ?-She is articulating that she wants to go home, and if there is not a way to fix these processes she would prefer to manage symptoms. We talked about hospice care and she is interested in this. Relayed this to primary team and to palliative care, who will discuss this further  ?-DNR  ? ?Best Practice (right click and "Reselect all SmartList Selections" daily)  ? ?Per primary  ? ? ? ?Labs   ?CBC: ?Recent Labs  ?Lab 05/07/21 ?1932 05/08/21 ?0137 05/12/21 ?0301  ?WBC 8.2 7.2  6.9 7.1  ?NEUTROABS 5.8 5.2  --   ?HGB 9.6* 9.5*  9.2* 8.7*  ?HCT 33.1* 31.7*  31.7* 28.8*  ?MCV 72.3* 72.5*  72.9* 71.6*  ?PLT 348  335  332 363  ? ? ?Basic Metabolic Panel: ?Recent Labs  ?Lab 05/08/21 ?0137 05/09/21 ?1610 05/10/21 ?0307 05/11/21 ?9604 05/12/21 ?0301  ?NA 138 137 137 138 137  ?K 3.5 4.0 3.8 4.2 3.9  ?CL 105 106 104 103 102  ?CO2 _0 ?GLUCOSE 143* 109* 107* 122* 130*  ?BUN 30* 32* 32* 29* 28*  ?CREATININE 1.25* 1.38* 1.21* 1.16* 1.16*  ?CALCIUM 8.1* 7.8* 7.9* 8.1* 8.1*  ?MG 1.8  --   --  1.8  --   ?PHOS 3.0  --   --   --   --   ? ?GFR: ?Estimated Creatinine Clearance: 37.3 mL/min (A) (by C-G formula based on SCr of 1.16 mg/dL (H)). ?Recent Labs  ?Lab 05/07/21 ?1932 05/08/21 ?0137 05/12/21 ?0301  ?WBC  8.2 7.2  6.9 7.1  ?LATICACIDVEN 2.6* 2.0*  --   ? ? ?Liver Function Tests: ?Recent Labs  ?Lab 05/07/21 ?1932 05/08/21 ?0137  ?AST 13* 15  ?ALT 5 10  ?ALKPHOS 67 71  ?BILITOT 0.4 0.7  ?PROT 5.8* 5.4*  ?ALBUMIN 2.7* 2.1*  ? ?No results for input(s): LIPASE, AMYLASE in the last 168 hours. ?No results for input(s): AMMONIA in the last 168 hours. ? ?ABG ?   ?Component Value Date/Time  ? HCO3 25.7 04/06/2021 0759  ? TCO2 27 04/06/2021 0759  ? ACIDBASEDEF 2.0 12/31/2020 1417  ? O2SAT 79 04/06/2021 0759  ?  ? ?Coagulation Profile: ?Recent Labs  ?Lab 05/07/21 ?1932  ?INR 1.2  ? ? ?Cardiac Enzymes: ?No results for input(s): CKTOTAL, CKMB, CKMBINDEX, TROPONINI in the last 168 hours. ? ?HbA1C: ?Hgb A1c MFr Bld  ?Date/Time Value Ref Range Status  ?05/08/2021 01:37 AM 5.7 (H) 4.8 - 5.6 % Final  ?  Comment:  ?  (NOTE) ?Pre diabetes:          5.7%-6.4% ? ?Diabetes:              >6.4% ? ?Glycemic control for   <7.0% ?adults with diabetes ?  ?03/31/2020 12:34 PM 6.2 (H) 4.8 - 5.6 % Final  ?  Comment:  ?           Prediabetes: 5.7 - 6.4 ?         Diabetes: >6.4 ?         Glycemic control for adults with diabetes: <7.0 ?  ? ? ?CBG: ?Recent Labs  ?Lab 05/08/21 ?1155 05/08/21 ?1530 05/08/21 ?2134 05/09/21 ?1201 05/09/21 ?1516  ?GLUCAP 110* 99 106* 202* 120*  ? ? ?Review of Systems:   ?Review of Systems  ?Constitutional:  Positive for malaise/fatigue.  ?HENT: Negative.    ?Eyes: Negative.   ?Respiratory:  Positive for shortness of breath.   ?Cardiovascular:  Positive for leg swelling.  ?Gastrointestinal: Negative.   ?Genitourinary: Negative.   ?Musculoskeletal: Negative.   ?Skin: Negative.   ?Neurological: Negative.   ?Endo/Heme/Allergies: Negative.   ?Psychiatric/Behavioral:  Positive for depression.   ? ? ?Past Medical History:  ?She,  has a past medical history of Acute deep vein thrombosis (DVT) of left peroneal vein ([place]), Anemia, Arthritis, CHF (congestive heart failure) ([place]), Colon polyps, Community acquired pneumonia  (11/21/2019), Essential hypertension, GAVE (gastric antral vascular ectasia), HLD (hyperlipidemia), Interstitial lung disease ([place]), Pneumonia due to COVID-19 virus, Prediabetes, and Pulmonary hypertension ([place]).  ? ?Surgical History:  ? ?Past Surgical History:  ?Procedure Laterality Date  ? BIOPSY  01/03/2021  ? Procedure: BIOPSY;  Surgeon: Sharyn Creamer, MD;  Location: [facility];  Service: Gastroenterology;;  ? BIOPSY  03/14/2021  ? Procedure: BIOPSY;  Surgeon: Sharyn Creamer, MD;  Location: [facility];  Service: Gastroenterology;;  ? CHOLECYSTECTOMY    ? COLONOSCOPY N/A 12/03/2014  ? Procedure: COLONOSCOPY;  Surgeon: Rogene Houston, MD;  Location: [facility];  Service: Endoscopy;  Laterality: N/A;  830  ? COLONOSCOPY WITH PROPOFOL N/A 03/10/2020  ? Procedure: COLONOSCOPY WITH PROPOFOL;  Surgeon: Rogene Houston, MD;  [facility]: [facility];  Service: Endoscopy;  Laterality: N/A;  1:15  ? ESOPHAGOGASTRODUODENOSCOPY (EGD) WITH PROPOFOL N/A 01/03/2021  ? Procedure: ESOPHAGOGASTRODUODENOSCOPY (EGD) WITH PROPOFOL;  Surgeon: Sharyn Creamer, MD;  Location: [facility];  Service: Gastroenterology;  Laterality: N/A;  ? ESOPHAGOGASTRODUODENOSCOPY (EGD) WITH PROPOFOL N/A 03/14/2021  ? Procedure: ESOPHAGOGASTRODUODENOSCOPY (EGD) WITH PROPOFOL;  Surgeon: Sharyn Creamer, MD;  Location: [facility];  Service: Gastroenterology;  Laterality: N/A;  ? HEMOSTASIS CLIP PLACEMENT  03/14/2021  ? Procedure: HEMOSTASIS CLIP PLACEMENT;  Surgeon: Sharyn Creamer, MD;  Location: [facility];  Service: Gastroenterology;;  ? IR THORACENTESIS ASP PLEURAL SPACE W/IMG GUIDE  04/04/2021  ? POLYPECTOMY  03/10/2020  ? Procedure: POLYPECTOMY;  Surgeon: Rogene Houston, MD;  [facility]: [facility];  Service: Endoscopy;;  ? RADIOFREQUENCY ABLATION  01/03/2021  ? Procedure: RADIO FREQUENCY ABLATION;  Surgeon: Sharyn Creamer, MD;  Location: [facility];  Service: Gastroenterology;;  ? RIGHT HEART CATH N/A 04/20/2020  ? Procedure: RIGHT HEART  CATH;  Surgeon: Jolaine Artist, MD;  Location: [facility];  Service: Cardiovascular;  Laterality: N/A;  ? RIGHT HEART CATH N/A 12/31/2020  ? Procedure: RIGHT HEART CATH;  Surgeon: Jolaine Artist, MD;  Windell Moulding

## 2021-05-12 NOTE — Progress Notes (Signed)
Physical Therapy Treatment ?Patient Details ?Name: Jacqueline Harris ?MRN: [id number] ?DOB: [birth year] ?Today's Date: 05/12/2021 ? ? ?History of Present Illness Patient is a 76 y/o female who presents on 4/22 with SOB and LE swelling. Admitted with acute on chronic diastolic CHF. Last admission 3/23 for respiratory failure with hypoxia secondary to CHF. PMH includes HTN, GAVE syndrome, CHF. ? ?  ?PT Comments  ? ? The pt and her daughter were present upon arrival of PT, initial plan for session was to determine level of assist and O2 needs for OOB transfers, but pt reports fatigue at this time limiting OOB mobility. The pt was maintaining SpO2 87-91% on 6L at rest, low of 84% with brief test of RA and pt took 3 min on 6L to recover back to 88%. Discussed energy conservation, limiting mobility and exertion, use of O2, and use of DME in the home with the pt and her daughter. The daughter plans to provide needed assist (with help from other family) after return home and has no further questions about mobility, transfers, or DME.  ? ?The pt does have increased O2 needs, see additional O2 saturations note, and will likely require short duration increase in O2 flow with mobiltiy. The pt has a pulse ox at home to track SpO2 and guide O2 usage. The pt is safe for d/c home with family once increased O2 provided. ?   ?Recommendations for follow up therapy are one component of a multi-disciplinary discharge planning process, led by the attending physician.  Recommendations may be updated based on patient status, additional functional criteria and insurance authorization. ? ?Follow Up Recommendations ? Home health PT ?  ?  ?Assistance Recommended at Discharge Frequent or constant Supervision/Assistance  ?Patient can return home with the following A little help with bathing/dressing/bathroom;A lot of help with walking and/or transfers;Assistance with cooking/housework;Direct supervision/assist for medications management;Direct  supervision/assist for financial management;Help with stairs or ramp for entrance;Assist for transportation ?  ?Equipment Recommendations ? Wheelchair (measurements PT);Wheelchair cushion (measurements PT) (increased O2)  ?  ?Recommendations for Other Services   ? ? ?  ?Precautions / Restrictions Precautions ?Precautions: Other (comment) ?Precaution Comments: watch O2 ?Restrictions ?Weight Bearing Restrictions: No  ?  ? ?Mobility ? Bed Mobility ?  ?  ?  ?  ?  ?  ?  ?General bed mobility comments: pt declined mobility at this time ?  ?  ? ?  ? ? ?  ?   ?Cognition Arousal/Alertness: Awake/alert ?Behavior During Therapy: Flat affect ?Overall Cognitive Status: Within Functional Limits for tasks assessed ?  ?  ?  ?  ?  ?  ?  ?  ?  ?  ?  ?  ?  ?  ?  ?  ?General Comments: pt following conversation and interjecting as relevant. slightly decreased insight to deficits ?  ?  ? ?  ?Exercises Other Exercises ?Other Exercises: pt and family educated on energy conservation, DME ? ?  ?General Comments General comments (skin integrity, edema, etc.): SpO2 to low of 84% on RA at rest, needed 6L O2 to maintain 87-91% at rest, pt declined OOB mobility ?  ?  ? ?Pertinent Vitals/Pain Pain Assessment ?Pain Assessment: No/denies pain ?Pain Intervention(s): Monitored during session  ? ? ? ?PT Goals (current goals can now be found in the care plan section) Acute Rehab PT Goals ?Patient Stated Goal: to breathe better and go home ?PT Goal Formulation: With patient ?Time For Goal Achievement: 05/22/21 ?Potential to Achieve Goals: Fair ?  Progress towards PT goals: Not progressing toward goals - comment (pt declining OOB mobility) ? ?  ?Frequency ? ? ? Min 3X/week ? ? ? ?  ?PT Plan Equipment recommendations need to be updated  ? ? ?   ?AM-PAC PT "6 Clicks" Mobility   ?Outcome Measure ? Help needed turning from your back to your side while in a flat bed without using bedrails?: A Little ?Help needed moving from lying on your back to sitting on the  side of a flat bed without using bedrails?: A Little ?Help needed moving to and from a bed to a chair (including a wheelchair)?: A Little ?Help needed standing up from a chair using your arms (e.g., wheelchair or bedside chair)?: A Little ?Help needed to walk in hospital room?: A Lot ?Help needed climbing 3-5 steps with a railing? : A Lot ?6 Click Score: 16 ? ?  ?End of Session Equipment Utilized During Treatment: Oxygen ?Activity Tolerance: Patient limited by fatigue ?Patient left: in bed;with call bell/phone within reach;with family/visitor present ?Nurse Communication: Mobility status (O2 needs) ?PT Visit Diagnosis: Difficulty in walking, not elsewhere classified (R26.2);Muscle weakness (generalized) (M62.81) ?  ? ? ?Time: 1247-1300 ?PT Time Calculation (min) (ACUTE ONLY): 13 min ? ?Charges:  $Self Care/Home Management: 8-22          ?          ? ?West Carbo, PT, DPT  ? ?Acute Rehabilitation Department ?Pager #: [phone] - [phone] ? ? ?Jacqueline Harris ?05/12/2021, 1:53 PM ? ?

## 2021-05-12 NOTE — Progress Notes (Signed)
SATURATION QUALIFICATIONS: (This note is used to comply with regulatory documentation for home oxygen) ? ?Patient Saturations on Room Air at Rest = 84% ? ?Patient Saturations on Room Air while Ambulating = N/A ? ?Patient Saturations on 6 Liters of oxygen at rest = 88% ? ?Please briefly explain why patient needs home oxygen: The pt requires 6L O2 to maintain SpO2 88-92% at rest.  ? ?West Carbo, PT, DPT  ? ?Acute Rehabilitation Department ?Pager #: [phone] - [phone] ?

## 2021-05-12 NOTE — Progress Notes (Signed)
?PROGRESS NOTE ? ? ? ?Jacqueline Harris  YNW:[id number] DOB: 1945-08-02 DOA: 05/07/2021 ?PCP: Glenda Chroman, MD  ?75/F with chronic respiratory failure on 3 L home O2, COPD, interstitial lung disease, chronic diastolic CHF, pulmonary hypertension presented to the ED with worsening dyspnea and edema despite recent increases in diuretic dose. ?-Recently hospitalized 3/19-3/22 for decompensated heart failure, underwent thoracentesis then ?-Chest x-ray in the ED noted cardiomegaly, pulmonary vascular congestion, pleural effusions ?-Diuresed with IV Lasix, seen by cardiology in consultation, prognosis felt to be poor in the setting of pulmonary hypertension, ILD/COPD ?-Palliative care consulted ? ?Subjective: ?-Feels poorly, some dyspnea at rest ? ?Assessment and Plan: ? ?Acute on chronic diastolic heart failure ([place]) ?Severe pulmonary hypertension ?-Echo with EF of 55-60%, moderate to severe RV dysfunction, moderately enlarged RV cavity, severe PAH ?-Seen by cardiology in consultation, diuresed with IV Lasix she is 10.7 L negative ?-Now on oral Lasix, allergic to aldactone ?-Add additional IV Lasix this afternoon x1 ?-Overall prognosis is poor in the setting of chronic lung disease, ILD, severe pulmonary hypertension, palliative care consulted, now DNR, patient wishes to go home with home health services and follow-up with palliative care ? ?COPD/chronic respiratory failure ?Interstitial lung disease ?-Clinically do not suspect ILD flare flare, however I worry that her lung disease could be worsening, -she is hesitant to consider hospice services at discharge, given symptom burden will request pulmonary input regarding optimization, prognosis etc. ? ?Acute kidney injury superimposed on chronic kidney disease ([place]) ?CKD stage 3B ?-Diuresed with IV Lasix, kidney function is stable, continue oral Lasix ? ?Type 2 diabetes mellitus with hyperlipidemia ([place]) ?-CBGs are stable, continue sliding scale insulin ? ?Anemia due to  chronic disease and iron deficiency ?-Monitor hemoglobin ? ?Anxiety/depression ?-Continue current regimen of citalopram and alprazolam ? ?DVT prophylaxis: Lovenox ?Code Status: DNR ?Family Communication: Discussed with patient and sister at bedside ?Disposition Plan: Home with palliative care versus hospice services ? ?Consultants:  ?Cardiology, palliative care ? ?Procedures:  ? ?Antimicrobials:  ? ? ?Objective: ?Vitals:  ? 05/11/21 2019 05/12/21 0402 05/12/21 6578 05/12/21 0939  ?BP: (!) 127/54 (!) 124/45 (!) 124/38   ?Pulse: (!) 107 (!) 105 (!) 102   ?Resp: 17 18 (!) 23   ?Temp: 98 ?F (36.7 ?C) 99.1 ?F (37.3 ?C) 98.7 ?F (37.1 ?C)   ?TempSrc: Oral Oral Oral   ?SpO2: 95% 92% 93% 92%  ?Weight:      ? ? ?Intake/Output Summary (Last 24 hours) at 05/12/2021 1445 ?Last data filed at 05/12/2021 1000 ?Gross per 24 hour  ?Intake 240 ml  ?Output --  ?Net 240 ml  ? ?Filed Weights  ? 05/09/21 0405 05/10/21 0435 05/11/21 0425  ?Weight: 60 kg 57.3 kg 56.3 kg  ? ? ?Examination: ? ?General exam: Chronically ill thinly built female, laying in bed, AAOx3, uncomfortable appearing, mild distress with any activity ?HEENT: No JVD ?CVS: S1-S2, regular rhythm ?Lungs: Mildly tachypneic, few Rales at the bases ?Abdomen: Soft, nontender, bowel sounds present ?Extremities: Trace edema ?Skin: No rashes ?Psychiatry: Flat affect ? ? ? ?Data Reviewed:  ? ?CBC: ?Recent Labs  ?Lab 05/07/21 ?1932 05/08/21 ?0137 05/12/21 ?0301  ?WBC 8.2 7.2  6.9 7.1  ?NEUTROABS 5.8 5.2  --   ?HGB 9.6* 9.5*  9.2* 8.7*  ?HCT 33.1* 31.7*  31.7* 28.8*  ?MCV 72.3* 72.5*  72.9* 71.6*  ?PLT 348 335  332 363  ? ?Basic Metabolic Panel: ?Recent Labs  ?Lab 05/08/21 ?0137 05/09/21 ?4696 05/10/21 ?0307 05/11/21 ?2952 05/12/21 ?0301  ?NA  138 137 137 138 137  ?K 3.5 4.0 3.8 4.2 3.9  ?CL 105 106 104 103 102  ?CO2 _0 ?GLUCOSE 143* 109* 107* 122* 130*  ?BUN 30* 32* 32* 29* 28*  ?CREATININE 1.25* 1.38* 1.21* 1.16* 1.16*  ?CALCIUM 8.1* 7.8* 7.9* 8.1* 8.1*  ?MG 1.8  --    --  1.8  --   ?PHOS 3.0  --   --   --   --   ? ?GFR: ?Estimated Creatinine Clearance: 37.3 mL/min (A) (by C-G formula based on SCr of 1.16 mg/dL (H)). ?Liver Function Tests: ?Recent Labs  ?Lab 05/07/21 ?1932 05/08/21 ?0137  ?AST 13* 15  ?ALT 5 10  ?ALKPHOS 67 71  ?BILITOT 0.4 0.7  ?PROT 5.8* 5.4*  ?ALBUMIN 2.7* 2.1*  ? ?No results for input(s): LIPASE, AMYLASE in the last 168 hours. ?No results for input(s): AMMONIA in the last 168 hours. ?Coagulation Profile: ?Recent Labs  ?Lab 05/07/21 ?1932  ?INR 1.2  ? ?Cardiac Enzymes: ?No results for input(s): CKTOTAL, CKMB, CKMBINDEX, TROPONINI in the last 168 hours. ?BNP (last 3 results) ?No results for input(s): PROBNP in the last 8760 hours. ?HbA1C: ?No results for input(s): HGBA1C in the last 72 hours. ?CBG: ?Recent Labs  ?Lab 05/08/21 ?1155 05/08/21 ?1530 05/08/21 ?2134 05/09/21 ?1201 05/09/21 ?1516  ?GLUCAP 110* 99 106* 202* 120*  ? ?Lipid Profile: ?No results for input(s): CHOL, HDL, LDLCALC, TRIG, CHOLHDL, LDLDIRECT in the last 72 hours. ?Thyroid Function Tests: ?No results for input(s): TSH, T4TOTAL, FREET4, T3FREE, THYROIDAB in the last 72 hours. ?Anemia Panel: ?No results for input(s): VITAMINB12, FOLATE, FERRITIN, TIBC, IRON, RETICCTPCT in the last 72 hours. ?Urine analysis: ?   ?Component Value Date/Time  ? [place] YELLOW 11/21/2019 1900  ? APPEARANCEUR CLEAR 11/21/2019 1900  ? LABSPEC 1.010 11/21/2019 1900  ? PHURINE 5.5 11/21/2019 1900  ? GLUCOSEU NEGATIVE 11/21/2019 1900  ? [place] NEGATIVE 11/21/2019 1900  ? [place] NEGATIVE 11/21/2019 1900  ? [place] NEGATIVE 11/21/2019 1900  ? PROTEINUR NEGATIVE 11/21/2019 1900  ? NITRITE NEGATIVE 11/21/2019 1900  ? LEUKOCYTESUR NEGATIVE 11/21/2019 1900  ? ?Sepsis Labs: ?_1 (procalcitonin:4,lacticidven:4) ? ?)No results found for this or any previous visit (from the past 240 hour(s)).  ? ?Radiology Studies: ?No results found. ? ? ?Scheduled Meds: ? citalopram  20 mg Oral Daily  ? enoxaparin (LOVENOX) injection   40 mg Subcutaneous QHS  ? feeding supplement  237 mL Oral BID BM  ? ferrous sulfate  325 mg Oral Q breakfast  ? furosemide  40 mg Oral Daily  ? ipratropium-albuterol  3 mL Nebulization BID  ? macitentan  10 mg Oral Daily  ? multivitamin with minerals  1 tablet Oral Daily  ? pantoprazole  40 mg Oral BID  ? sildenafil  20 mg Oral TID  ? tobramycin   Left Eye QID  ? ?Continuous Infusions: ? ? LOS: 4 days  ? ? ?Time spent: 34mn ? ?PDomenic Polite MD ?[facility] ? ? ?05/12/2021, 2:45 PM  ?  ?

## 2021-05-12 NOTE — TOC Progression Note (Signed)
Transition of Care (TOC) - Progression Note  ? ? ?Patient Details  ?Name: Jacqueline Harris ?MRN: [id number] ?Date of Birth: March 17, 1945 ? ?Transition of Care (TOC) CM/SW Contact  ?Bethena Roys, RN ?Phone Number: ?05/12/2021, 4:39 PM ? ?Clinical Narrative: Case Manager received a secure chat from the Palliative Nurse Practitioner that the patient and family has decided on home hospice. Family wants [facility]- Case Manager did attempt to call the office- they were having issues with the electrical-lights. Case Manager will follow up in the morning with the referral. Case Manager cancelled services with [facility] for home health. [facility] will need to use [place] for DME needs. Patients oxygen will need to be switched to this agency. The office will work on getting this changed. Case Manager will follow up with additional  DME needs in the morning.  ? ?Expected Discharge Plan: [place] ?Barriers to Discharge: Continued Medical Work up ? ?Expected Discharge Plan and Services ?Expected Discharge Plan: [place] ?In-house Referral: NA ?Discharge Planning Services: CM Consult ?Post Acute Care Choice: Hospice ?  ?                ?  ?DME Agency: NA ?  ?  ?  ?HH Arranged: PT, OT ?[place] Agency: [facility] ?Date [place]: 05/12/21 ?Time [place]: 1600 ?Representative spoke with at [place]: Marden Noble ? ? ?Readmission Risk Interventions ?   ? View : No data to display.  ?  ?  ?  ? ? ?

## 2021-05-13 ENCOUNTER — Inpatient Hospital Stay (HOSPITAL_COMMUNITY): Payer: Medicare HMO

## 2021-05-13 DIAGNOSIS — I272 Pulmonary hypertension, unspecified: Secondary | ICD-10-CM | POA: Diagnosis not present

## 2021-05-13 DIAGNOSIS — J849 Interstitial pulmonary disease, unspecified: Secondary | ICD-10-CM | POA: Diagnosis not present

## 2021-05-13 DIAGNOSIS — I5033 Acute on chronic diastolic (congestive) heart failure: Secondary | ICD-10-CM | POA: Diagnosis not present

## 2021-05-13 DIAGNOSIS — J9621 Acute and chronic respiratory failure with hypoxia: Secondary | ICD-10-CM | POA: Diagnosis not present

## 2021-05-13 LAB — BASIC METABOLIC PANEL
Anion gap: 5 (ref 5–15)
BUN: 27 mg/dL — ABNORMAL HIGH (ref 8–23)
CO2: 30 mmol/L (ref 22–32)
Calcium: 8 mg/dL — ABNORMAL LOW (ref 8.9–10.3)
Chloride: 100 mmol/L (ref 98–111)
Creatinine, Ser: 1.12 mg/dL — ABNORMAL HIGH (ref 0.44–1.00)
GFR, Estimated: 52 mL/min — ABNORMAL LOW (ref >60)
Glucose, Bld: 105 mg/dL — ABNORMAL HIGH (ref 70–99)
Potassium: 3.9 mmol/L (ref 3.5–5.1)
Sodium: 135 mmol/L (ref 135–145)

## 2021-05-13 MED ORDER — ALUM & MAG HYDROXIDE-SIMETH 200-200-20 MG/5ML PO SUSP
30.0000 mL | ORAL | Status: DC | PRN
Start: 2021-05-13 — End: 2021-05-16

## 2021-05-13 MED ORDER — FUROSEMIDE 10 MG/ML IJ SOLN
40.0000 mg | Freq: Once | INTRAMUSCULAR | Status: AC
  Administered 2021-05-13: 40 mg via INTRAVENOUS
  Filled 2021-05-13: qty 4

## 2021-05-13 MED ORDER — POTASSIUM CHLORIDE CRYS ER 20 MEQ PO TBCR
40.0000 meq | EXTENDED_RELEASE_TABLET | Freq: Once | ORAL | Status: AC
  Administered 2021-05-13: 40 meq via ORAL
  Filled 2021-05-13: qty 2

## 2021-05-13 NOTE — Progress Notes (Signed)
PT Cancellation Note ? ?Patient Details ?Name: Jacqueline Harris ?MRN: [id number] ?DOB: [birth year] ? ? ?Cancelled Treatment:    Reason Eval/Treat Not Completed: (P) Patient declined, no reason specified ?4076: Pt eating breakfast, defer until later. 1533: pt declined, c/o fatigue. Per daughter OK to keep on PT plan of care for now, she will let MD know if they decide to discontinue PT services. ? ?[name] ?05/13/2021, 4:30 PM ? ? ?

## 2021-05-13 NOTE — Progress Notes (Addendum)
Patient output 100cc urine since start of shift. Bladder scanned for 183cc urine. Dr. Broadus John Notified. Will continue to monitor.  ?

## 2021-05-13 NOTE — Progress Notes (Signed)
Patient alarm for oxygen saturations in the 80s on 6L [place].  Patient states her nose is blocked.  Lungs with crackles bilaterally, worse on right.  Placed on NRBM initially, sats increased to 100%.  Now on Venti mask with FiO2 45%. Dr. Broadus John notified, CXR ordered.  ?

## 2021-05-13 NOTE — Progress Notes (Signed)
?PROGRESS NOTE ? ? ? ?Jacqueline Harris  CVE:[id number] DOB: Nov 24, 1945 DOA: 05/07/2021 ?PCP: Glenda Chroman, MD  ?75/F with chronic respiratory failure on 3 L home O2, COPD, interstitial lung disease, chronic diastolic CHF, pulmonary hypertension presented to the ED with worsening dyspnea and edema despite recent increases in diuretic dose. ?-Recently hospitalized 3/19-3/22 for decompensated heart failure, underwent thoracentesis then ?-Chest x-ray in the ED noted cardiomegaly, pulmonary vascular congestion, pleural effusions ?-Diuresed with IV Lasix, seen by cardiology in consultation, prognosis felt to be poor in the setting of pulmonary hypertension, ILD/COPD ?-Seen by pulmonary, recommended supportive care ?-Palliative care consulted, now plan for home with hospice services ? ?Subjective: ?-More short of breath today, oxygen level dropped to 70s ? ?Assessment and Plan: ? ?Acute on chronic diastolic heart failure ([place]) ?Severe pulmonary hypertension ?-Echo with EF of 55-60%, moderate to severe RV dysfunction, moderately enlarged RV cavity, severe PAH ?-Seen by cardiology in consultation, diuresed with IV Lasix she is 10.7 L negative ?-Now on oral Lasix, allergic to aldactone ?-Add additional IV Lasix x2 today, repeat chest x-ray ?-Overall prognosis is poor in the setting of chronic lung disease, ILD, severe pulmonary hypertension, palliative care consulted, now DNR, plan for home with hospice services when medically optimized, arrangements made for hospice to start on Monday ? ?COPD/chronic respiratory failure ?Interstitial lung disease ?-Clinically do not suspect ILD flare flare, however I worry that her lung disease could be worsening, -appreciate pulmonary input, prognosis felt to be poor ?-See discussion above ? ?Acute kidney injury superimposed on chronic kidney disease ([place]) ?CKD stage 3B ?-Diuresed with IV Lasix, kidney function is stable, IV Lasix today ? ?Type 2 diabetes mellitus with hyperlipidemia  ([place]) ?-CBGs are stable, continue sliding scale insulin ? ?Anemia due to chronic disease and iron deficiency ?-Monitor hemoglobin ? ?Anxiety/depression ?-Continue current regimen of citalopram and alprazolam ? ?DVT prophylaxis: Lovenox ?Code Status: DNR ?Family Communication: Discussed with patient and sister/27 ?Disposition Plan: Home with hospice services likely Monday ? ?Consultants:  ?Cardiology, palliative care ? ?Procedures:  ? ?Antimicrobials:  ? ? ?Objective: ?Vitals:  ? 05/13/21 0836 05/13/21 0841 05/13/21 0936 05/13/21 1000  ?BP: (!) 121/54     ?Pulse: 100 93 90 94  ?Resp: (!) 21 16 (!) 26 (!) 21  ?Temp: 97.6 ?F (36.4 ?C)     ?TempSrc: Oral     ?SpO2: (!) 83% 95% 92% 92%  ?Weight:      ?Height:      ? ? ?Intake/Output Summary (Last 24 hours) at 05/13/2021 1427 ?Last data filed at 05/13/2021 0800 ?Gross per 24 hour  ?Intake 480 ml  ?Output 975 ml  ?Net -495 ml  ? ?Filed Weights  ? 05/10/21 0435 05/11/21 0425 05/13/21 0446  ?Weight: 57.3 kg 56.3 kg 57.9 kg  ? ? ?Examination: ? ?General exam: Chronically ill thinly built female laying in bed, AAOx3, uncomfortable appearing, mild distress with minimal activity ?HEENT: Positive JVD ?CVS: S1-S2, regular rhythm ?Lungs: Mildly tachypneic, few basilar rales ?Abdomen: Soft, nontender, bowel sounds present ?Extremities: Trace edema ?Skin: No rashes ?Psychiatry: Flat affect ? ? ? ?Data Reviewed:  ? ?CBC: ?Recent Labs  ?Lab 05/07/21 ?1932 05/08/21 ?0137 05/12/21 ?0301  ?WBC 8.2 7.2  6.9 7.1  ?NEUTROABS 5.8 5.2  --   ?HGB 9.6* 9.5*  9.2* 8.7*  ?HCT 33.1* 31.7*  31.7* 28.8*  ?MCV 72.3* 72.5*  72.9* 71.6*  ?PLT 348 335  332 363  ? ?Basic Metabolic Panel: ?Recent Labs  ?Lab 05/08/21 ?0137 05/09/21 ?0258 05/10/21 ?  8250 05/11/21 ?0370 05/12/21 ?0301 05/13/21 ?0350  ?NA 138 137 137 138 137 135  ?K 3.5 4.0 3.8 4.2 3.9 3.9  ?CL 105 106 104 103 102 100  ?CO2 _0 ?GLUCOSE 143* 109* 107* 122* 130* 105*  ?BUN 30* 32* 32* 29* 28* 27*  ?CREATININE 1.25* 1.38* 1.21*  1.16* 1.16* 1.12*  ?CALCIUM 8.1* 7.8* 7.9* 8.1* 8.1* 8.0*  ?MG 1.8  --   --  1.8  --   --   ?PHOS 3.0  --   --   --   --   --   ? ?GFR: ?Estimated Creatinine Clearance: 38.6 mL/min (A) (by C-G formula based on SCr of 1.12 mg/dL (H)). ?Liver Function Tests: ?Recent Labs  ?Lab 05/07/21 ?1932 05/08/21 ?0137  ?AST 13* 15  ?ALT 5 10  ?ALKPHOS 67 71  ?BILITOT 0.4 0.7  ?PROT 5.8* 5.4*  ?ALBUMIN 2.7* 2.1*  ? ?No results for input(s): LIPASE, AMYLASE in the last 168 hours. ?No results for input(s): AMMONIA in the last 168 hours. ?Coagulation Profile: ?Recent Labs  ?Lab 05/07/21 ?1932  ?INR 1.2  ? ?Cardiac Enzymes: ?No results for input(s): CKTOTAL, CKMB, CKMBINDEX, TROPONINI in the last 168 hours. ?BNP (last 3 results) ?No results for input(s): PROBNP in the last 8760 hours. ?HbA1C: ?No results for input(s): HGBA1C in the last 72 hours. ?CBG: ?Recent Labs  ?Lab 05/08/21 ?1155 05/08/21 ?1530 05/08/21 ?2134 05/09/21 ?1201 05/09/21 ?1516  ?GLUCAP 110* 99 106* 202* 120*  ? ?Lipid Profile: ?No results for input(s): CHOL, HDL, LDLCALC, TRIG, CHOLHDL, LDLDIRECT in the last 72 hours. ?Thyroid Function Tests: ?No results for input(s): TSH, T4TOTAL, FREET4, T3FREE, THYROIDAB in the last 72 hours. ?Anemia Panel: ?No results for input(s): VITAMINB12, FOLATE, FERRITIN, TIBC, IRON, RETICCTPCT in the last 72 hours. ?Urine analysis: ?   ?Component Value Date/Time  ? [place] YELLOW 11/21/2019 1900  ? APPEARANCEUR CLEAR 11/21/2019 1900  ? LABSPEC 1.010 11/21/2019 1900  ? PHURINE 5.5 11/21/2019 1900  ? GLUCOSEU NEGATIVE 11/21/2019 1900  ? [place] NEGATIVE 11/21/2019 1900  ? [place] NEGATIVE 11/21/2019 1900  ? Benjamin Stain NEGATIVE 11/21/2019 1900  ? PROTEINUR NEGATIVE 11/21/2019 1900  ? NITRITE NEGATIVE 11/21/2019 1900  ? LEUKOCYTESUR NEGATIVE 11/21/2019 1900  ? ?Sepsis Labs: ?_1 (procalcitonin:4,lacticidven:4) ? ?)No results found for this or any previous visit (from the past 240 hour(s)).  ? ?Radiology Studies: ?DG CHEST PORT 1  VIEW ? ?Result Date: 05/13/2021 ?CLINICAL DATA:  Decreased O2 sats with difficulty breathing. EXAM: PORTABLE CHEST 1 VIEW COMPARISON:  05/07/2021 FINDINGS: 0922 hours. The cardio pericardial silhouette is enlarged. Interval increase in left base collapse/consolidation with worsening left pleural effusion now moderate to large in size. Associated progression of airspace disease at the right base with small right pleural effusion associated. Telemetry leads overlie the chest. IMPRESSION: Worsening bibasilar airspace disease with bilateral pleural effusions, left greater than right. Electronically Signed   By: Misty Stanley M.D.   On: 05/13/2021 09:38   ? ? ?Scheduled Meds: ? citalopram  20 mg Oral Daily  ? enoxaparin (LOVENOX) injection  40 mg Subcutaneous QHS  ? feeding supplement  237 mL Oral BID BM  ? ferrous sulfate  325 mg Oral Q breakfast  ? furosemide  40 mg Intravenous Once  ? ipratropium-albuterol  3 mL Nebulization BID  ? macitentan  10 mg Oral Daily  ? multivitamin with minerals  1 tablet Oral Daily  ? pantoprazole  40 mg Oral BID  ? sildenafil  20 mg  Oral TID  ? tobramycin   Left Eye QID  ? ?Continuous Infusions: ? ? LOS: 5 days  ? ? ?Time spent: 47mn ? ?PDomenic Polite MD ?[facility] ? ? ?05/13/2021, 2:27 PM  ?  ?

## 2021-05-13 NOTE — TOC Progression Note (Addendum)
Transition of Care (TOC) - Progression Note  ? ? ?Patient Details  ?Name: GENESYS COGGESHALL ?MRN: [id number] ?Date of Birth: 04/11/1945 ? ?Transition of Care (TOC) CM/SW Contact  ?Bethena Roys, RN ?Phone Number: ?05/13/2021, 10:09 AM ? ?Clinical Narrative: Case Manager made the referral with [facility] @ [phone]. Vito Backers will be on call this weekend. Referral for Hospice accepted. MD states that the patient should be stable to transport home on Sunday. Hospice will have transportation available on Monday between 9:00 and 10:00 am. Case Manager asked for oxygen to be changed out from the home since it belongs to [place]. [place] will deliver oxygen to the home. Vito Backers will discuss with daughter about additional DME needs. Patient will probably benefit from a hospital bed. Weekend Case Manager added to secure chat just in case plans change on Sunday to reach out to [place] regarding transportation. Case Manager will continue to follow for additional transitoon of care needs.  ? ?1303 05-13-21 Case Manager received a call from [place] and she has spoken to daughter. Vito Backers has scheduled for transport to arrive Monday at 9:00 am. If the patient is not medically ready on Sunday- please call [facility]. Unit staff is aware of plan for 9:00 am discharge Monday.  ? ?Expected Discharge Plan: [place] ?Barriers to Discharge: Continued Medical Work up ? ?Expected Discharge Plan and Services ?Expected Discharge Plan: [place] ?In-house Referral: NA ?Discharge Planning Services: CM Consult ?Post Acute Care Choice: Hospice ?  ?                ?  ?DME Agency: NA ?  ?  ?  ?HH: RN ?[place] Agency: [facility] ?Date [place]: 05/12/21 ?Time [place]: 1600 ?Representative spoke with at [place]: Marden Noble ? ? ? ?Readmission Risk Interventions ?   ? View : No data to display.  ?  ?  ?  ? ? ?

## 2021-05-14 DIAGNOSIS — I5033 Acute on chronic diastolic (congestive) heart failure: Secondary | ICD-10-CM | POA: Diagnosis not present

## 2021-05-14 DIAGNOSIS — J849 Interstitial pulmonary disease, unspecified: Secondary | ICD-10-CM | POA: Diagnosis not present

## 2021-05-14 DIAGNOSIS — I272 Pulmonary hypertension, unspecified: Secondary | ICD-10-CM | POA: Diagnosis not present

## 2021-05-14 DIAGNOSIS — J9621 Acute and chronic respiratory failure with hypoxia: Secondary | ICD-10-CM | POA: Diagnosis not present

## 2021-05-14 LAB — BASIC METABOLIC PANEL
Anion gap: 6 (ref 5–15)
BUN: 29 mg/dL — ABNORMAL HIGH (ref 8–23)
CO2: 27 mmol/L (ref 22–32)
Calcium: 7.9 mg/dL — ABNORMAL LOW (ref 8.9–10.3)
Chloride: 102 mmol/L (ref 98–111)
Creatinine, Ser: 1.26 mg/dL — ABNORMAL HIGH (ref 0.44–1.00)
GFR, Estimated: 45 mL/min — ABNORMAL LOW (ref >60)
Glucose, Bld: 111 mg/dL — ABNORMAL HIGH (ref 70–99)
Potassium: 4.4 mmol/L (ref 3.5–5.1)
Sodium: 135 mmol/L (ref 135–145)

## 2021-05-14 MED ORDER — FUROSEMIDE 10 MG/ML IJ SOLN
40.0000 mg | Freq: Two times a day (BID) | INTRAMUSCULAR | Status: DC
  Administered 2021-05-14 – 2021-05-16 (×4): 40 mg via INTRAVENOUS
  Filled 2021-05-14 (×4): qty 4

## 2021-05-14 NOTE — Plan of Care (Signed)
?  Problem: Nutrition: ?Goal: Adequate nutrition will be maintained ?Outcome: Not Progressing ?  ?Problem: Pain Managment: ?Goal: General experience of comfort will improve ?Outcome: Progressing ?  ?Problem: Safety: ?Goal: Ability to remain free from injury will improve ?Outcome: Progressing ?  ?

## 2021-05-14 NOTE — Progress Notes (Signed)
?PROGRESS NOTE ? ? ? ?Jacqueline Harris  ASN:[id number] DOB: 23-Nov-1945 DOA: 05/07/2021 ?PCP: Glenda Chroman, MD  ?75/F with chronic respiratory failure on 3 L home O2, COPD, interstitial lung disease, chronic diastolic CHF, pulmonary hypertension presented to the ED with worsening dyspnea and edema despite recent increases in diuretic dose. ?-Recently hospitalized 3/19-3/22 for decompensated heart failure, underwent thoracentesis then ?-Chest x-ray in the ED noted cardiomegaly, pulmonary vascular congestion, pleural effusions ?-Diuresed with IV Lasix, seen by cardiology in consultation, prognosis felt to be poor in the setting of pulmonary hypertension, ILD/COPD ?-Seen by pulmonary, recommended supportive care ?-Palliative care consulted, now plan for home with hospice services ? ?Subjective: ?-Feels a little better today, resting more comfortably ? ?Assessment and Plan: ? ?Acute on chronic diastolic heart failure ([place]) ?Severe pulmonary hypertension ?-Echo with EF of 55-60%, moderate to severe RV dysfunction, moderately enlarged RV cavity, severe PAH ?-Seen by cardiology in consultation, diuresed with IV Lasix she is 10.7 L negative ?-Was switched over to p.o. and then developed volume overload again, repeated IV Lasix yesterday, continue same today to medically optimize ?-Overall prognosis is poor in the setting of chronic lung disease, ILD, severe pulmonary hypertension, palliative care consulted, now DNR, plan for home with hospice services when medically optimized, arrangements made for hospice to start on Monday ? ?COPD/chronic respiratory failure ?Interstitial lung disease ?-Clinically do not suspect ILD flare flare, however I worry that her lung disease could be worsening, -appreciate pulmonary input, prognosis felt to be poor ?-See discussion above ? ?Acute kidney injury superimposed on chronic kidney disease ([place]) ?CKD stage 3B ?-Diuresed with IV Lasix, kidney function is stable, IV Lasix today ? ?Type 2  diabetes mellitus with hyperlipidemia ([place]) ?-CBGs are stable, continue sliding scale insulin ? ?Anemia due to chronic disease and iron deficiency ?-Monitor hemoglobin ? ?Anxiety/depression ?-Continue current regimen of citalopram and alprazolam ? ?DVT prophylaxis: Lovenox ?Code Status: DNR ?Family Communication: Discussed with patient and sister/27 ?Disposition Plan: Home with hospice services likely Monday ? ?Consultants:  ?Cardiology, palliative care ? ?Procedures:  ? ?Antimicrobials:  ? ? ?Objective: ?Vitals:  ? 05/13/21 2033 05/14/21 0037 05/14/21 0559 05/14/21 1937  ?BP: (!) 125/57 (!) 105/43 (!) 110/53   ?Pulse: 97 90 79   ?Resp: (!) _0 ?Temp: 98.1 ?F (36.7 ?C) 98 ?F (36.7 ?C) 97.7 ?F (36.5 ?C)   ?TempSrc: Oral Oral Oral   ?SpO2: 90% 97% 98% 93%  ?Weight:      ?Height:      ? ? ?Intake/Output Summary (Last 24 hours) at 05/14/2021 1117 ?Last data filed at 05/14/2021 0500 ?Gross per 24 hour  ?Intake 390 ml  ?Output 525 ml  ?Net -135 ml  ? ?Filed Weights  ? 05/10/21 0435 05/11/21 0425 05/13/21 0446  ?Weight: 57.3 kg 56.3 kg 57.9 kg  ? ? ?Examination: ? ?General exam: Chronically ill thinly built female laying in bed, appears more comfortable, no distress this morning ?HEENT: Positive JVD ?CVS: S1-S2, regular rhythm ?Lungs: Bilateral lower lung rales noted  ?Abdomen: Soft, nontender, bowel sounds present ?Extremities: Trace edema ?Skin: No rashes ?Psychiatry: Flat affect ? ? ? ?Data Reviewed:  ? ?CBC: ?Recent Labs  ?Lab 05/07/21 ?1932 05/08/21 ?0137 05/12/21 ?0301  ?WBC 8.2 7.2  6.9 7.1  ?NEUTROABS 5.8 5.2  --   ?HGB 9.6* 9.5*  9.2* 8.7*  ?HCT 33.1* 31.7*  31.7* 28.8*  ?MCV 72.3* 72.5*  72.9* 71.6*  ?PLT 348 335  332 363  ? ?Basic Metabolic Panel: ?Recent  Labs  ?Lab 05/08/21 ?0137 05/09/21 ?3382 05/10/21 ?0307 05/11/21 ?5053 05/12/21 ?0301 05/13/21 ?0350 05/14/21 ?0302  ?NA 138   < > 137 138 137 135 135  ?K 3.5   < > 3.8 4.2 3.9 3.9 4.4  ?CL 105   < > 104 103 102 100 102  ?CO2 26   < > _0 ?GLUCOSE 143*   < > 107* 122* 130* 105* 111*  ?BUN 30*   < > 32* 29* 28* 27* 29*  ?CREATININE 1.25*   < > 1.21* 1.16* 1.16* 1.12* 1.26*  ?CALCIUM 8.1*   < > 7.9* 8.1* 8.1* 8.0* 7.9*  ?MG 1.8  --   --  1.8  --   --   --   ?PHOS 3.0  --   --   --   --   --   --   ? < > = values in this interval not displayed.  ? ?GFR: ?Estimated Creatinine Clearance: 34.3 mL/min (A) (by C-G formula based on SCr of 1.26 mg/dL (H)). ?Liver Function Tests: ?Recent Labs  ?Lab 05/07/21 ?1932 05/08/21 ?0137  ?AST 13* 15  ?ALT 5 10  ?ALKPHOS 67 71  ?BILITOT 0.4 0.7  ?PROT 5.8* 5.4*  ?ALBUMIN 2.7* 2.1*  ? ?No results for input(s): LIPASE, AMYLASE in the last 168 hours. ?No results for input(s): AMMONIA in the last 168 hours. ?Coagulation Profile: ?Recent Labs  ?Lab 05/07/21 ?1932  ?INR 1.2  ? ?Cardiac Enzymes: ?No results for input(s): CKTOTAL, CKMB, CKMBINDEX, TROPONINI in the last 168 hours. ?BNP (last 3 results) ?No results for input(s): PROBNP in the last 8760 hours. ?HbA1C: ?No results for input(s): HGBA1C in the last 72 hours. ?CBG: ?Recent Labs  ?Lab 05/08/21 ?1155 05/08/21 ?1530 05/08/21 ?2134 05/09/21 ?1201 05/09/21 ?1516  ?GLUCAP 110* 99 106* 202* 120*  ? ?Lipid Profile: ?No results for input(s): CHOL, HDL, LDLCALC, TRIG, CHOLHDL, LDLDIRECT in the last 72 hours. ?Thyroid Function Tests: ?No results for input(s): TSH, T4TOTAL, FREET4, T3FREE, THYROIDAB in the last 72 hours. ?Anemia Panel: ?No results for input(s): VITAMINB12, FOLATE, FERRITIN, TIBC, IRON, RETICCTPCT in the last 72 hours. ?Urine analysis: ?   ?Component Value Date/Time  ? [place] YELLOW 11/21/2019 1900  ? APPEARANCEUR CLEAR 11/21/2019 1900  ? LABSPEC 1.010 11/21/2019 1900  ? PHURINE 5.5 11/21/2019 1900  ? GLUCOSEU NEGATIVE 11/21/2019 1900  ? [place] NEGATIVE 11/21/2019 1900  ? [place] NEGATIVE 11/21/2019 1900  ? Benjamin Stain NEGATIVE 11/21/2019 1900  ? PROTEINUR NEGATIVE 11/21/2019 1900  ? NITRITE NEGATIVE 11/21/2019 1900  ? LEUKOCYTESUR NEGATIVE 11/21/2019 1900   ? ?Sepsis Labs: ?_1 (procalcitonin:4,lacticidven:4) ? ?)No results found for this or any previous visit (from the past 240 hour(s)).  ? ?Radiology Studies: ?DG CHEST PORT 1 VIEW ? ?Result Date: 05/13/2021 ?CLINICAL DATA:  Decreased O2 sats with difficulty breathing. EXAM: PORTABLE CHEST 1 VIEW COMPARISON:  05/07/2021 FINDINGS: 0922 hours. The cardio pericardial silhouette is enlarged. Interval increase in left base collapse/consolidation with worsening left pleural effusion now moderate to large in size. Associated progression of airspace disease at the right base with small right pleural effusion associated. Telemetry leads overlie the chest. IMPRESSION: Worsening bibasilar airspace disease with bilateral pleural effusions, left greater than right. Electronically Signed   By: Misty Stanley M.D.   On: 05/13/2021 09:38   ? ? ?Scheduled Meds: ? citalopram  20 mg Oral Daily  ? enoxaparin (LOVENOX) injection  40 mg Subcutaneous QHS  ? feeding supplement  237 mL Oral BID  BM  ? ferrous sulfate  325 mg Oral Q breakfast  ? ipratropium-albuterol  3 mL Nebulization BID  ? macitentan  10 mg Oral Daily  ? multivitamin with minerals  1 tablet Oral Daily  ? pantoprazole  40 mg Oral BID  ? sildenafil  20 mg Oral TID  ? tobramycin   Left Eye QID  ? ?Continuous Infusions: ? ? LOS: 6 days  ? ? ?Time spent: 32mn ? ?PDomenic Polite MD ?[facility] ? ? ?05/14/2021, 11:17 AM  ?  ?

## 2021-05-15 DIAGNOSIS — J9621 Acute and chronic respiratory failure with hypoxia: Secondary | ICD-10-CM | POA: Diagnosis not present

## 2021-05-15 DIAGNOSIS — J849 Interstitial pulmonary disease, unspecified: Secondary | ICD-10-CM | POA: Diagnosis not present

## 2021-05-15 DIAGNOSIS — I5033 Acute on chronic diastolic (congestive) heart failure: Secondary | ICD-10-CM | POA: Diagnosis not present

## 2021-05-15 DIAGNOSIS — I272 Pulmonary hypertension, unspecified: Secondary | ICD-10-CM | POA: Diagnosis not present

## 2021-05-15 LAB — BASIC METABOLIC PANEL
Anion gap: 8 (ref 5–15)
BUN: 32 mg/dL — ABNORMAL HIGH (ref 8–23)
CO2: 30 mmol/L (ref 22–32)
Calcium: 8.2 mg/dL — ABNORMAL LOW (ref 8.9–10.3)
Chloride: 99 mmol/L (ref 98–111)
Creatinine, Ser: 1.19 mg/dL — ABNORMAL HIGH (ref 0.44–1.00)
GFR, Estimated: 48 mL/min — ABNORMAL LOW (ref >60)
Glucose, Bld: 108 mg/dL — ABNORMAL HIGH (ref 70–99)
Potassium: 4.6 mmol/L (ref 3.5–5.1)
Sodium: 137 mmol/L (ref 135–145)

## 2021-05-15 NOTE — Progress Notes (Signed)
?PROGRESS NOTE ? ? ? ?Jacqueline Harris  FHS:[id number] DOB: [birth year] DOA: 05/07/2021 ?PCP: Glenda Chroman, MD  ?75/F with chronic respiratory failure on 3 L home O2, COPD, interstitial lung disease, chronic diastolic CHF, pulmonary hypertension presented to the ED with worsening dyspnea and edema despite recent increases in diuretic dose. ?-Recently hospitalized 3/19-3/22 for decompensated heart failure, underwent thoracentesis then ?-Chest x-ray in the ED noted cardiomegaly, pulmonary vascular congestion, pleural effusions ?-Diuresed with IV Lasix, seen by cardiology in consultation, prognosis felt to be poor in the setting of pulmonary hypertension, ILD/COPD ?-Seen by pulmonary, recommended supportive care ?-Palliative care consulted, now plan for home with hospice services ? ?Subjective: ?-Feels a little better today, resting more comfortably ? ?Assessment and Plan: ? ?Acute on chronic diastolic heart failure ([place]) ?Severe pulmonary hypertension ?-Echo with EF of 55-60%, moderate to severe RV dysfunction, moderately enlarged RV cavity, severe PAH ?-Seen by cardiology in consultation, diuresed with IV Lasix she is 10.7 L negative ?-Was switched over to p.o. and then developed volume overload again, given multiple repeat doses of IV Lasix, continue same today, transition to oral diuretics tomorrow ?-Overall prognosis is poor in the setting of chronic lung disease, ILD, severe pulmonary hypertension, palliative care consulted, now DNR, plan for home with hospice services when medically optimized, arrangements made for hospice to start on Monday ? ?COPD/chronic respiratory failure ?Interstitial lung disease ?-Clinically do not suspect ILD flare flare, however I worry that her lung disease could be worsening, -appreciate pulmonary input, prognosis felt to be poor ?-See discussion above ? ?Acute kidney injury superimposed on chronic kidney disease ([place]) ?CKD stage 3B ?-Diuresed with IV Lasix, kidney function is stable, IV  Lasix today ? ?Type 2 diabetes mellitus with hyperlipidemia ([place]) ?-CBGs are stable, continue sliding scale insulin ? ?Anemia due to chronic disease and iron deficiency ?-Monitor hemoglobin ? ?Anxiety/depression ?-Continue current regimen of citalopram and alprazolam ? ?DVT prophylaxis: Lovenox ?Code Status: DNR ?Family Communication: Discussed with patient and sister 4/28 ?Disposition Plan: Home with hospice services likely Monday ? ?Consultants:  ?Cardiology, palliative care ? ?Procedures:  ? ?Antimicrobials:  ? ? ?Objective: ?Vitals:  ? 05/14/21 1204 05/14/21 1949 05/15/21 0408 05/15/21 0820  ?BP: (!) 116/43 130/90 (!) 129/50   ?Pulse: 89 89 95   ?Resp: (!) _0 ?Temp: 98.2 ?F (36.8 ?C) 98.3 ?F (36.8 ?C) 98.4 ?F (36.9 ?C)   ?TempSrc: Oral Oral Oral   ?SpO2: 97% 100% 95% 100%  ?Weight:   61.2 kg   ?Height:      ? ? ?Intake/Output Summary (Last 24 hours) at 05/15/2021 1153 ?Last data filed at 05/15/2021 0411 ?Gross per 24 hour  ?Intake --  ?Output 500 ml  ?Net -500 ml  ? ?Filed Weights  ? 05/11/21 0425 05/13/21 0446 05/15/21 0408  ?Weight: 56.3 kg 57.9 kg 61.2 kg  ? ? ?Examination: ? ?General exam: Chronically ill thinly built female laying in bed, appears more comfortable, no distress ?HEENT: Positive JVD ?CVS: S1-S2, regular rhythm ?Lungs: Few basilar rales noted ?Abdomen: Soft, nontender, bowel sounds present ?Extremities: Trace edema ?Skin: No rashes ?Psychiatry: Flat affect ? ? ? ?Data Reviewed:  ? ?CBC: ?Recent Labs  ?Lab 05/12/21 ?0301  ?WBC 7.1  ?HGB 8.7*  ?HCT 28.8*  ?MCV 71.6*  ?PLT 363  ? ?Basic Metabolic Panel: ?Recent Labs  ?Lab 05/11/21 ?8473 05/12/21 ?0301 05/13/21 ?0350 05/14/21 ?0302 05/15/21 ?0856  ?NA 138 137 135 135 137  ?K 4.2 3.9 3.9 4.4 4.6  ?CL 103  102 100 102 99  ?CO2 _0 ?GLUCOSE 122* 130* 105* 111* 108*  ?BUN 29* 28* 27* 29* 32*  ?CREATININE 1.16* 1.16* 1.12* 1.26* 1.19*  ?CALCIUM 8.1* 8.1* 8.0* 7.9* 8.2*  ?MG 1.8  --   --   --   --   ? ?GFR: ?Estimated Creatinine  Clearance: 36.4 mL/min (A) (by C-G formula based on SCr of 1.19 mg/dL (H)). ?Liver Function Tests: ?No results for input(s): AST, ALT, ALKPHOS, BILITOT, PROT, ALBUMIN in the last 168 hours. ? ?No results for input(s): LIPASE, AMYLASE in the last 168 hours. ?No results for input(s): AMMONIA in the last 168 hours. ?Coagulation Profile: ?No results for input(s): INR, PROTIME in the last 168 hours. ? ?Cardiac Enzymes: ?No results for input(s): CKTOTAL, CKMB, CKMBINDEX, TROPONINI in the last 168 hours. ?BNP (last 3 results) ?No results for input(s): PROBNP in the last 8760 hours. ?HbA1C: ?No results for input(s): HGBA1C in the last 72 hours. ?CBG: ?Recent Labs  ?Lab 05/08/21 ?1155 05/08/21 ?1530 05/08/21 ?2134 05/09/21 ?1201 05/09/21 ?1516  ?GLUCAP 110* 99 106* 202* 120*  ? ?Lipid Profile: ?No results for input(s): CHOL, HDL, LDLCALC, TRIG, CHOLHDL, LDLDIRECT in the last 72 hours. ?Thyroid Function Tests: ?No results for input(s): TSH, T4TOTAL, FREET4, T3FREE, THYROIDAB in the last 72 hours. ?Anemia Panel: ?No results for input(s): VITAMINB12, FOLATE, FERRITIN, TIBC, IRON, RETICCTPCT in the last 72 hours. ?Urine analysis: ?   ?Component Value Date/Time  ? [place] YELLOW 11/21/2019 1900  ? APPEARANCEUR CLEAR 11/21/2019 1900  ? LABSPEC 1.010 11/21/2019 1900  ? PHURINE 5.5 11/21/2019 1900  ? GLUCOSEU NEGATIVE 11/21/2019 1900  ? [place] NEGATIVE 11/21/2019 1900  ? [place] NEGATIVE 11/21/2019 1900  ? Benjamin Stain NEGATIVE 11/21/2019 1900  ? PROTEINUR NEGATIVE 11/21/2019 1900  ? NITRITE NEGATIVE 11/21/2019 1900  ? LEUKOCYTESUR NEGATIVE 11/21/2019 1900  ? ?Sepsis Labs: ?_1 (procalcitonin:4,lacticidven:4) ? ?)No results found for this or any previous visit (from the past 240 hour(s)).  ? ?Radiology Studies: ?No results found. ? ? ?Scheduled Meds: ? citalopram  20 mg Oral Daily  ? enoxaparin (LOVENOX) injection  40 mg Subcutaneous QHS  ? feeding supplement  237 mL Oral BID BM  ? ferrous sulfate  325 mg Oral Q breakfast   ? furosemide  40 mg Intravenous BID  ? ipratropium-albuterol  3 mL Nebulization BID  ? macitentan  10 mg Oral Daily  ? multivitamin with minerals  1 tablet Oral Daily  ? pantoprazole  40 mg Oral BID  ? sildenafil  20 mg Oral TID  ? tobramycin   Left Eye QID  ? ?Continuous Infusions: ? ? LOS: 7 days  ? ? ?Time spent: 41mn ? ?PDomenic Polite MD ?[facility] ? ? ?05/15/2021, 11:53 AM  ?  ?

## 2021-05-16 ENCOUNTER — Other Ambulatory Visit (HOSPITAL_COMMUNITY): Payer: Self-pay | Admitting: Family Medicine

## 2021-05-16 ENCOUNTER — Other Ambulatory Visit (HOSPITAL_COMMUNITY): Payer: Medicare HMO

## 2021-05-16 DIAGNOSIS — I5033 Acute on chronic diastolic (congestive) heart failure: Secondary | ICD-10-CM | POA: Diagnosis not present

## 2021-05-16 LAB — BASIC METABOLIC PANEL
Anion gap: 5 (ref 5–15)
BUN: 32 mg/dL — ABNORMAL HIGH (ref 8–23)
CO2: 32 mmol/L (ref 22–32)
Calcium: 8.4 mg/dL — ABNORMAL LOW (ref 8.9–10.3)
Chloride: 100 mmol/L (ref 98–111)
Creatinine, Ser: 1.12 mg/dL — ABNORMAL HIGH (ref 0.44–1.00)
GFR, Estimated: 52 mL/min — ABNORMAL LOW (ref >60)
Glucose, Bld: 121 mg/dL — ABNORMAL HIGH (ref 70–99)
Potassium: 4.2 mmol/L (ref 3.5–5.1)
Sodium: 137 mmol/L (ref 135–145)

## 2021-05-16 MED ORDER — MORPHINE SULFATE (CONCENTRATE) 10 MG /0.5 ML PO SOLN: 5.0000 mg | ORAL | 0 refills | Status: DC | PRN

## 2021-05-16 MED ORDER — ALPRAZOLAM 0.5 MG PO TABS: 0.5000 mg | ORAL_TABLET | Freq: Two times a day (BID) | ORAL | 0 refills | Status: DC | PRN

## 2021-05-16 MED ORDER — MORPHINE SULFATE (CONCENTRATE) 10 MG /0.5 ML PO SOLN: 5.0000 mg | ORAL | 0 refills | Status: AC | PRN

## 2021-05-16 MED ORDER — ALPRAZOLAM 0.5 MG PO TABS: 0.5000 mg | ORAL_TABLET | Freq: Two times a day (BID) | ORAL | 0 refills | Status: AC | PRN

## 2021-05-16 MED ORDER — TORSEMIDE 20 MG PO TABS: ORAL_TABLET | ORAL | 0 refills | Status: DC

## 2021-05-16 MED ORDER — TORSEMIDE 20 MG PO TABS
ORAL_TABLET | ORAL | 0 refills | Status: AC
Start: 2021-05-16 — End: ?

## 2021-05-16 NOTE — TOC Transition Note (Signed)
Transition of Care (TOC) - CM/SW Discharge Note ? ? ?Patient Details  ?Name: Jacqueline Harris ?MRN: [id number] ?Date of Birth: 1945-05-06 ? ?Transition of Care (TOC) CM/SW Contact:  ?Graves-Bigelow, Ocie Cornfield, RN ?Phone Number: ?05/16/2021, 9:50 AM ? ? ?Clinical Narrative:  Patient will transition home today via EMS arranged via [facility]. Daughter is aware of transport home. Patient now on six liters 02 and DME has arrived at the home. Medications will be e-scribed to [facility] and delivered to the patient. No further needs identified from Case Manager. ? ? ? ?Final next level of care: [place] ?Barriers to Discharge: Continued Medical Work up ? ? ?Patient Goals and CMS Choice ?Patient states their goals for this hospitalization and ongoing recovery are:: to return home. ?  ?Choice offered to / list presented to : Adult Children ? ?Discharge Placement ?  ?           ?  ?  ?  ?  ? ?Discharge Plan and Services ?In-house Referral: NA ?Discharge Planning Services: CM Consult ?Post Acute Care Choice: Hospice          ?  ?DME Agency: NA ?  ?  ?  ?HH Arranged: RN ?[place] Agency: [facility] ?Date [place]: 05/12/21 ?Time [place]: 1600 ?Representative spoke with at [place]: Marden Noble ? ?Social Determinants of Health (SDOH) Interventions ?  ? ? ?Readmission Risk Interventions ?   ? View : No data to display.  ?  ?  ?  ? ? ? ? ? ?

## 2021-05-16 NOTE — Care Management Important Message (Signed)
Important Message ? ?Patient Details  ?Name: Jacqueline Harris ?MRN: [id number] ?Date of Birth: 1945-04-03 ? ? ?Medicare Important Message Given:  Yes ? ? ? ? ?[name] ?05/16/2021, 9:32 AM ?

## 2021-05-17 ENCOUNTER — Ambulatory Visit: Payer: Medicare HMO | Admitting: Pulmonary Disease

## 2021-05-17 NOTE — Discharge Summary (Signed)
Physician Discharge Summary  ?Jacqueline Harris YQI:[id number] DOB: 1945-05-01 DOA: 05/07/2021 ? ?PCP: Glenda Chroman, MD ? ?Admit date: 05/07/2021 ?Discharge date: 05/16/2021 ? ?Time spent: 35 minutes ? ?Recommendations for Outpatient Follow-up:  ?Home with hospice services ? ? ?Discharge Diagnoses:  ?Principal Problem: ?  Acute on chronic diastolic heart failure ([place]) ?Right heart failure ?Severe pulmonary hypertension ?COPD ?Chronic respiratory failure ?Interstitial lung disease ?Severe protein calorie malnutrition ?  Acute kidney injury superimposed on chronic kidney disease ([place]) ?  Type 2 diabetes mellitus with hyperlipidemia ([place]) ?  Anemia ?  Depression ? ? ?Discharge Condition: Stable ? ?Diet recommendation: Comfort ? ?Filed Weights  ? 05/13/21 0446 05/15/21 0408 05/16/21 0558  ?Weight: 57.9 kg 61.2 kg 55.5 kg  ? ? ?History of present illness:  ?75/F with chronic respiratory failure on 3 L home O2, COPD, interstitial lung disease, chronic diastolic CHF, pulmonary hypertension presented to the ED with worsening dyspnea and edema despite recent increases in diuretic dose. ?-Recently hospitalized 3/19-3/22 for decompensated heart failure, underwent thoracentesis then ?-Chest x-ray in the ED noted cardiomegaly, pulmonary vascular congestion, pleural effusions ?-Diuresed with IV Lasix, seen by cardiology in consultation, prognosis felt to be poor in the setting of pulmonary hypertension, ILD/COPD ?-Seen by pulmonary, recommended supportive care ? ?Hospital Course:  ? ?Acute on chronic diastolic heart failure ([place]) ?Severe pulmonary hypertension ?-Echo with EF of 55-60%, moderate to severe RV dysfunction, moderately enlarged RV cavity, severe PAH ?-Seen by cardiology in consultation, diuresed with IV Lasix she is 10.7 L negative ?-Was switched over to p.o. and then developed volume overload again, given multiple repeat doses of IV Lasix, transitioned back to oral diuretics, prognosis felt to be poor in the setting of  chronic lung disease, severe PAH etc. ?- palliative care consulted, now DNR, plan for home with hospice services when medically optimized, arrangements made for home hospice ?  ?COPD/chronic respiratory failure ?Interstitial lung disease ?-Clinically do not suspect ILD flare flare, however I worry that her lung disease could be worsening,  ?-- Seen by pulmonary as well in consultation, prognosis felt to be poor ?-See discussion above ?  ?Acute kidney injury superimposed on chronic kidney disease ([place]) ?CKD stage 3B ?-Diuresed with IV Lasix, kidney function is stable, changed to oral diuretics at discharge ?  ?Type 2 diabetes mellitus with hyperlipidemia ([place]) ?-CBGs are stable, continue sliding scale insulin ?  ?Anemia due to chronic disease and iron deficiency ?-Stable ?  ?Anxiety/depression ?-Continue current regimen of citalopram and alprazolam ?  ?Severe protein calorie malnutrition ?-Supplements added ?  ?Consultants:  ?Cardiology, pulmonary,  palliative care ? ? ?Discharge Exam: ?Vitals:  ? 05/16/21 0558 05/16/21 0843  ?BP: (!) 130/45   ?Pulse: 100 85  ?Resp: (!) 29 (!) 24  ?Temp: 98.1 ?F (36.7 ?C)   ?SpO2: 92% 95%  ? ? ?General exam: Chronically ill thinly built female laying in bed, appears more comfortable, no distress ?HEENT: Positive JVD ?CVS: S1-S2, regular rhythm ?Lungs: Few basilar rales noted ?Abdomen: Soft, nontender, bowel sounds present ?Extremities: Trace edema ?Skin: No rashes ?Psychiatry: Flat affect ? ?Discharge Instructions ? ? ?Discharge Instructions   ? ? Diet - low sodium heart healthy   Complete by: As directed ?  ? Increase activity slowly   Complete by: As directed ?  ? ?  ? ?Allergies as of 05/16/2021   ? ?   Reactions  ? Sildenafil   ? Blurry vision with large doses  ? Spironolactone Rash  ? ?  ? ?  ?  Medication List  ?  ? ?TAKE these medications   ? ?acetaminophen 650 MG CR tablet ?Commonly known as: TYLENOL ?Take 650 mg by mouth every 8 (eight) hours as needed for pain. ?  ?ALPRAZolam  0.5 MG tablet ?Commonly known as: Duanne Moron ?Take 1 tablet (0.5 mg total) by mouth 2 (two) times daily as needed for anxiety. ?What changed: when to take this ?  ?citalopram 20 MG tablet ?Commonly known as: CELEXA ?Take 20 mg by mouth daily. ?  ?ferrous sulfate 325 (65 FE) MG EC tablet ?Take 1 tablet (325 mg total) by mouth daily. ?  ?hydrOXYzine 25 MG capsule ?Commonly known as: VISTARIL ?Take 25 mg by mouth at bedtime as needed (sleep). ?  ?metolazone 2.5 MG tablet ?Commonly known as: ZAROXOLYN ?Take only as directed by CHF clinic. ?  ?morphine CONCENTRATE 10 mg / 0.5 ml concentrated solution ?Take 0.25 mLs (5 mg total) by mouth every 4 (four) hours as needed for severe pain, shortness of breath or moderate pain. ?  ?multivitamin with minerals tablet ?Take 1 tablet by mouth daily. ?  ?omeprazole 40 MG capsule ?Commonly known as: PRILOSEC ?Take 1 capsule (40 mg total) by mouth 2 (two) times daily. ?  ?Opsumit 10 MG tablet ?Generic drug: macitentan ?Take 1 tablet (10 mg total) by mouth daily. ?  ?potassium chloride 10 MEQ tablet ?Commonly known as: KLOR-CON ?TAKE 1 TABLET BY MOUTH EVERY DAY ?What changed: when to take this ?  ?sildenafil 20 MG tablet ?Commonly known as: REVATIO ?Take 1 tablet (20 mg total) by mouth 3 (three) times daily. ?  ?torsemide 20 MG tablet ?Commonly known as: DEMADEX ?Take 51m daily ?What changed: additional instructions ?  ? ?  ? ?Allergies  ?Allergen Reactions  ? Sildenafil   ?  Blurry vision with large doses  ? Spironolactone Rash  ? ? Follow-up Information   ? ? [place].   ?Why: RN for Hospice support-office to call with visit times ?Contact information: ?[address] ?[place]?[phone]? ? ?  ?  ? ?  ?  ? ?  ? ? ? ?The results of significant diagnostics from this hospitalization (including imaging, microbiology, ancillary and laboratory) are listed below for reference.   ? ?Significant Diagnostic Studies: ?DG CHEST PORT 1 VIEW ? ?Result Date:  05/13/2021 ?CLINICAL DATA:  Decreased O2 sats with difficulty breathing. EXAM: PORTABLE CHEST 1 VIEW COMPARISON:  05/07/2021 FINDINGS: 0922 hours. The cardio pericardial silhouette is enlarged. Interval increase in left base collapse/consolidation with worsening left pleural effusion now moderate to large in size. Associated progression of airspace disease at the right base with small right pleural effusion associated. Telemetry leads overlie the chest. IMPRESSION: Worsening bibasilar airspace disease with bilateral pleural effusions, left greater than right. Electronically Signed   By: EMisty StanleyM.D.   On: 05/13/2021 09:38  ? ?DG Chest Port 1 View ? ?Result Date: 05/07/2021 ?CLINICAL DATA:  Shortness of breath. EXAM: PORTABLE CHEST 1 VIEW COMPARISON:  04/04/2021 chest radiograph FINDINGS: Cardiomegaly again noted. Pulmonary vascular congestion and mild interstitial opacities are noted. Slightly increasing LEFT pleural effusion/LEFT LOWER lung atelectasis noted. A small RIGHT pleural effusion is noted. No pneumothorax or acute bony abnormality. IMPRESSION: 1. Cardiomegaly with pulmonary vascular congestion, mild interstitial edema and slightly increasing LEFT pleural effusion/LEFT LOWER lung atelectasis. 2. Small RIGHT pleural effusion. Electronically Signed   By: JMargarette CanadaM.D.   On: 05/07/2021 18:17   ? ?Microbiology: ?No results found for  this or any previous visit (from the past 240 hour(s)).  ? ?Labs: ?Basic Metabolic Panel: ?Recent Labs  ?Lab 05/11/21 ?5831 05/12/21 ?0301 05/13/21 ?0350 05/14/21 ?0302 05/15/21 ?6742 05/16/21 ?0406  ?NA 138 137 135 135 137 137  ?K 4.2 3.9 3.9 4.4 4.6 4.2  ?CL 103 102 100 102 99 100  ?CO2 _0 32  ?GLUCOSE 122* 130* 105* 111* 108* 121*  ?BUN 29* 28* 27* 29* 32* 32*  ?CREATININE 1.16* 1.16* 1.12* 1.26* 1.19* 1.12*  ?CALCIUM 8.1* 8.1* 8.0* 7.9* 8.2* 8.4*  ?MG 1.8  --   --   --   --   --   ? ?Liver Function Tests: ?No results for input(s): AST, ALT, ALKPHOS, BILITOT,  PROT, ALBUMIN in the last 168 hours. ?No results for input(s): LIPASE, AMYLASE in the last 168 hours. ?No results for input(s): AMMONIA in the last 168 hours. ?CBC: ?Recent Labs  ?Lab 05/12/21 ?0301  ?WBC 7.

## 2021-06-16 DEATH — deceased

## 2021-07-05 ENCOUNTER — Encounter (HOSPITAL_COMMUNITY): Payer: Medicare HMO | Admitting: Internal Medicine

## 2022-09-17 IMAGING — DX DG CHEST 1V PORT
1 series · 1 of 1 positions shown · non-contrast
Comparison: 04/04/2021 chest radiograph

CLINICAL DATA: Shortness of breath.

EXAM:
PORTABLE CHEST 1 VIEW

[chest ap]
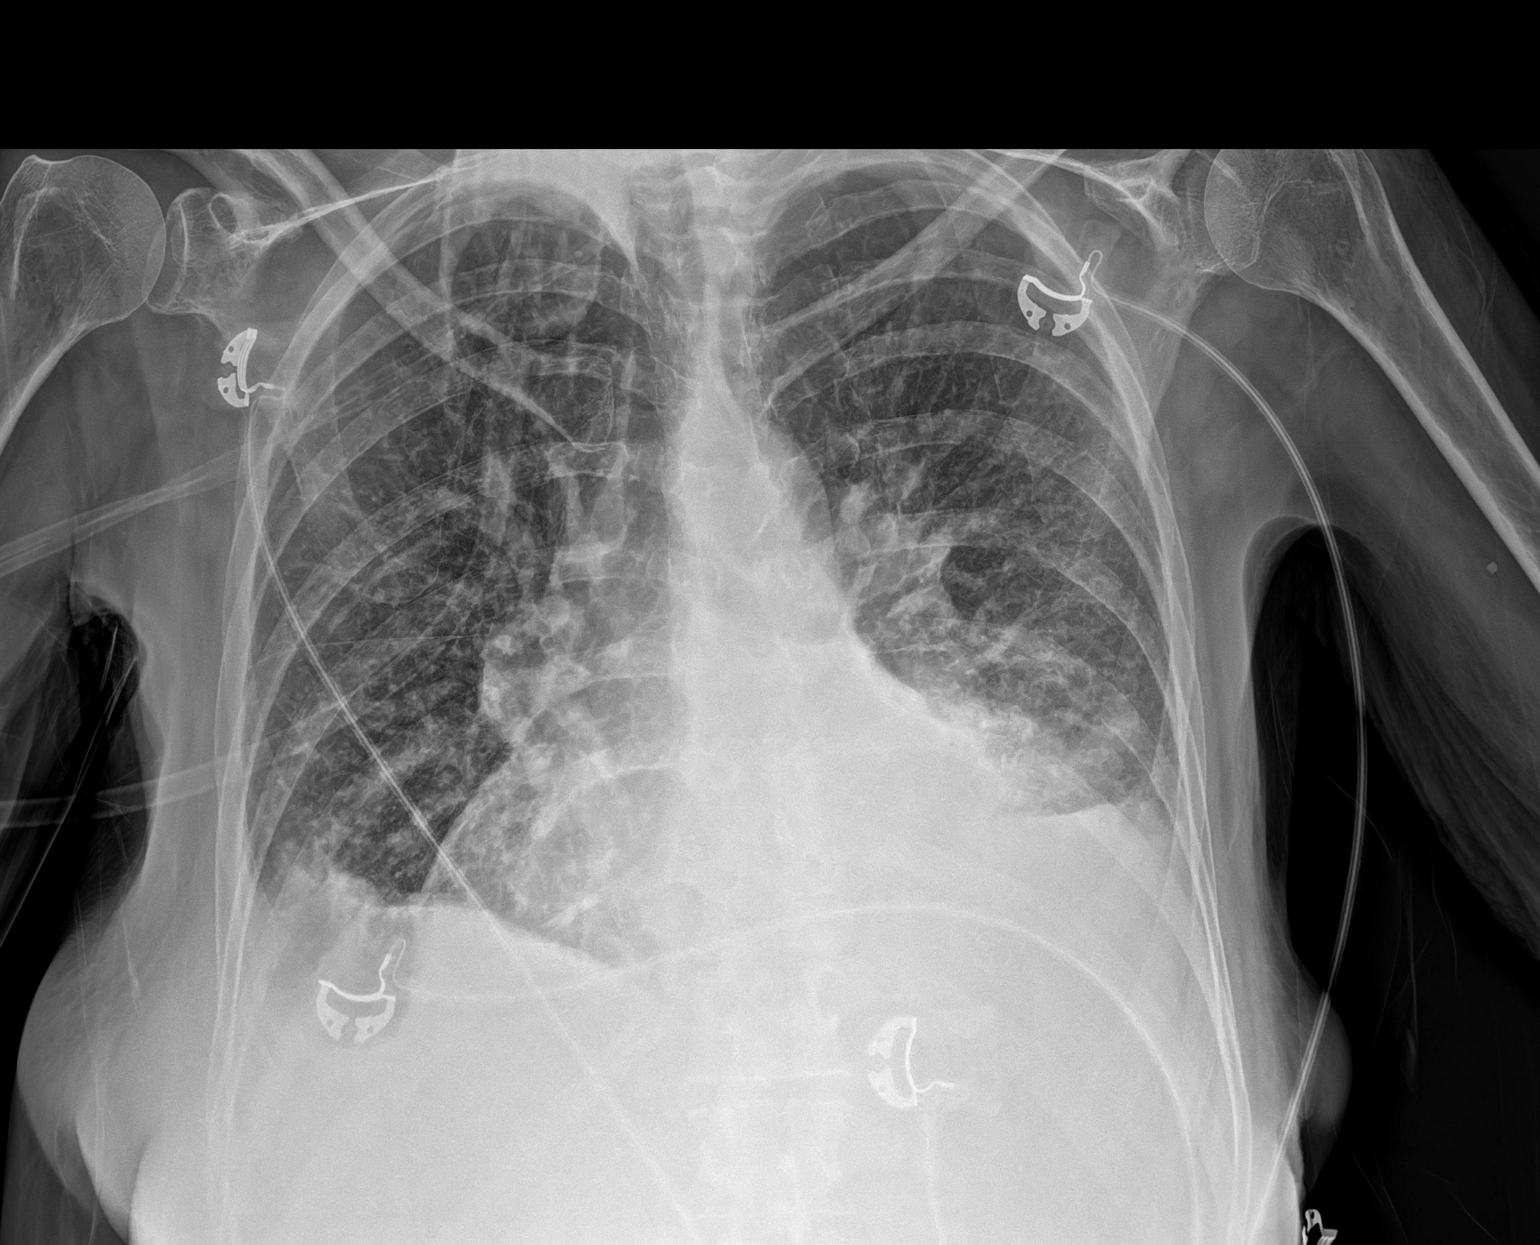

[1 of 1 positions shown; findings below may reference images not displayed]

FINDINGS: Cardiomegaly again noted.

Pulmonary vascular congestion and mild interstitial opacities are
noted.

Slightly increasing LEFT pleural effusion/LEFT LOWER lung
atelectasis noted.

A small RIGHT pleural effusion is noted.

No pneumothorax or acute bony abnormality.
IMPRESSION: 1. Cardiomegaly with pulmonary vascular congestion, mild
interstitial edema and slightly increasing LEFT pleural
effusion/LEFT LOWER lung atelectasis.
2. Small RIGHT pleural effusion.
